# Patient Record
Sex: Female | Born: 1970 | Race: White | Hispanic: No | Marital: Married | Smoking: Never smoker
Health system: Southern US, Community
[De-identification: ages and names within clinical notes are randomized; demographics above are authoritative.]

## PROBLEM LIST (undated history)

## (undated) DIAGNOSIS — I509 Heart failure, unspecified: Secondary | ICD-10-CM

## (undated) DIAGNOSIS — F419 Anxiety disorder, unspecified: Secondary | ICD-10-CM

## (undated) DIAGNOSIS — C73 Malignant neoplasm of thyroid gland: Secondary | ICD-10-CM

## (undated) DIAGNOSIS — N6320 Unspecified lump in the left breast, unspecified quadrant: Secondary | ICD-10-CM

## (undated) DIAGNOSIS — J9383 Other pneumothorax: Secondary | ICD-10-CM

## (undated) DIAGNOSIS — K219 Gastro-esophageal reflux disease without esophagitis: Secondary | ICD-10-CM

## (undated) DIAGNOSIS — F329 Major depressive disorder, single episode, unspecified: Secondary | ICD-10-CM

## (undated) DIAGNOSIS — Z8042 Family history of malignant neoplasm of prostate: Secondary | ICD-10-CM

## (undated) DIAGNOSIS — R112 Nausea with vomiting, unspecified: Secondary | ICD-10-CM

## (undated) DIAGNOSIS — T7840XA Allergy, unspecified, initial encounter: Secondary | ICD-10-CM

## (undated) DIAGNOSIS — E079 Disorder of thyroid, unspecified: Secondary | ICD-10-CM

## (undated) DIAGNOSIS — J9311 Primary spontaneous pneumothorax: Secondary | ICD-10-CM

## (undated) DIAGNOSIS — R51 Headache: Secondary | ICD-10-CM

## (undated) DIAGNOSIS — Z9889 Other specified postprocedural states: Secondary | ICD-10-CM

## (undated) DIAGNOSIS — R519 Headache, unspecified: Secondary | ICD-10-CM

## (undated) DIAGNOSIS — C4490 Unspecified malignant neoplasm of skin, unspecified: Secondary | ICD-10-CM

## (undated) DIAGNOSIS — C50919 Malignant neoplasm of unspecified site of unspecified female breast: Secondary | ICD-10-CM

## (undated) DIAGNOSIS — G43909 Migraine, unspecified, not intractable, without status migrainosus: Secondary | ICD-10-CM

## (undated) DIAGNOSIS — Z8041 Family history of malignant neoplasm of ovary: Secondary | ICD-10-CM

## (undated) DIAGNOSIS — Z8619 Personal history of other infectious and parasitic diseases: Secondary | ICD-10-CM

## (undated) DIAGNOSIS — Z803 Family history of malignant neoplasm of breast: Secondary | ICD-10-CM

## (undated) DIAGNOSIS — C801 Malignant (primary) neoplasm, unspecified: Secondary | ICD-10-CM

## (undated) DIAGNOSIS — Z87440 Personal history of urinary (tract) infections: Secondary | ICD-10-CM

## (undated) DIAGNOSIS — Z800 Family history of malignant neoplasm of digestive organs: Secondary | ICD-10-CM

## (undated) DIAGNOSIS — I2699 Other pulmonary embolism without acute cor pulmonale: Secondary | ICD-10-CM

## (undated) HISTORY — DX: Family history of malignant neoplasm of breast: Z80.3

## (undated) HISTORY — DX: Headache: R51

## (undated) HISTORY — PX: MASTECTOMY: SHX3

## (undated) HISTORY — DX: Disorder of thyroid, unspecified: E07.9

## (undated) HISTORY — DX: Family history of malignant neoplasm of digestive organs: Z80.0

## (undated) HISTORY — DX: Allergy, unspecified, initial encounter: T78.40XA

## (undated) HISTORY — DX: Family history of malignant neoplasm of prostate: Z80.42

## (undated) HISTORY — DX: Anxiety disorder, unspecified: F41.9

## (undated) HISTORY — DX: Personal history of urinary (tract) infections: Z87.440

## (undated) HISTORY — PX: FOOT TENDON SURGERY: SHX958

## (undated) HISTORY — PX: CHOLECYSTECTOMY: SHX55

## (undated) HISTORY — DX: Malignant neoplasm of thyroid gland: C73

## (undated) HISTORY — DX: Malignant (primary) neoplasm, unspecified: C80.1

## (undated) HISTORY — DX: Family history of malignant neoplasm of ovary: Z80.41

## (undated) HISTORY — DX: Migraine, unspecified, not intractable, without status migrainosus: G43.909

## (undated) HISTORY — PX: ABDOMINAL HYSTERECTOMY: SHX81

## (undated) HISTORY — DX: Personal history of other infectious and parasitic diseases: Z86.19

## (undated) HISTORY — DX: Malignant neoplasm of unspecified site of unspecified female breast: C50.919

## (undated) HISTORY — PX: PACEMAKER INSERTION: SHX728

## (undated) HISTORY — DX: Headache, unspecified: R51.9

## (undated) HISTORY — DX: Unspecified malignant neoplasm of skin, unspecified: C44.90

---
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
## 1998-09-10 ENCOUNTER — Other Ambulatory Visit: Admission: RE | Admit: 1998-09-10 | Discharge: 1998-09-10 | Payer: Self-pay | Admitting: Obstetrics and Gynecology

## 1999-04-30 ENCOUNTER — Encounter: Payer: Self-pay | Admitting: Emergency Medicine

## 1999-04-30 ENCOUNTER — Emergency Department (HOSPITAL_COMMUNITY): Admission: EM | Admit: 1999-04-30 | Discharge: 1999-04-30 | Payer: Self-pay | Admitting: Emergency Medicine

## 2000-11-04 ENCOUNTER — Other Ambulatory Visit: Admission: RE | Admit: 2000-11-04 | Discharge: 2000-11-04 | Payer: Self-pay | Admitting: Obstetrics and Gynecology

## 2001-07-14 DIAGNOSIS — C4490 Unspecified malignant neoplasm of skin, unspecified: Secondary | ICD-10-CM

## 2001-07-14 HISTORY — DX: Unspecified malignant neoplasm of skin, unspecified: C44.90

## 2001-08-13 ENCOUNTER — Emergency Department (HOSPITAL_COMMUNITY): Admission: EM | Admit: 2001-08-13 | Discharge: 2001-08-13 | Payer: Self-pay | Admitting: Emergency Medicine

## 2001-09-20 ENCOUNTER — Encounter: Admission: RE | Admit: 2001-09-20 | Discharge: 2001-09-20 | Payer: Self-pay | Admitting: Internal Medicine

## 2001-09-20 ENCOUNTER — Encounter: Payer: Self-pay | Admitting: Internal Medicine

## 2001-09-23 ENCOUNTER — Encounter: Payer: Self-pay | Admitting: Internal Medicine

## 2001-09-23 ENCOUNTER — Encounter: Admission: RE | Admit: 2001-09-23 | Discharge: 2001-09-23 | Payer: Self-pay | Admitting: Internal Medicine

## 2001-10-08 ENCOUNTER — Ambulatory Visit (HOSPITAL_BASED_OUTPATIENT_CLINIC_OR_DEPARTMENT_OTHER): Admission: RE | Admit: 2001-10-08 | Discharge: 2001-10-08 | Payer: Self-pay | Admitting: *Deleted

## 2001-10-08 ENCOUNTER — Encounter (INDEPENDENT_AMBULATORY_CARE_PROVIDER_SITE_OTHER): Payer: Self-pay | Admitting: *Deleted

## 2002-06-15 ENCOUNTER — Other Ambulatory Visit: Admission: RE | Admit: 2002-06-15 | Discharge: 2002-06-15 | Payer: Self-pay | Admitting: Obstetrics and Gynecology

## 2002-06-17 ENCOUNTER — Ambulatory Visit (HOSPITAL_COMMUNITY): Admission: RE | Admit: 2002-06-17 | Discharge: 2002-06-17 | Payer: Self-pay | Admitting: Obstetrics and Gynecology

## 2002-06-17 ENCOUNTER — Encounter: Payer: Self-pay | Admitting: Obstetrics and Gynecology

## 2002-07-14 HISTORY — PX: THYROIDECTOMY: SHX17

## 2002-08-01 ENCOUNTER — Ambulatory Visit (HOSPITAL_COMMUNITY): Admission: RE | Admit: 2002-08-01 | Discharge: 2002-08-01 | Payer: Self-pay | Admitting: Surgery

## 2002-08-01 ENCOUNTER — Encounter: Payer: Self-pay | Admitting: Surgery

## 2002-08-01 ENCOUNTER — Encounter (INDEPENDENT_AMBULATORY_CARE_PROVIDER_SITE_OTHER): Payer: Self-pay | Admitting: *Deleted

## 2002-08-02 ENCOUNTER — Encounter (INDEPENDENT_AMBULATORY_CARE_PROVIDER_SITE_OTHER): Payer: Self-pay | Admitting: *Deleted

## 2002-09-26 ENCOUNTER — Encounter: Payer: Self-pay | Admitting: Surgery

## 2002-09-27 ENCOUNTER — Emergency Department (HOSPITAL_COMMUNITY): Admission: EM | Admit: 2002-09-27 | Discharge: 2002-09-27 | Payer: Self-pay | Admitting: Unknown Physician Specialty

## 2002-09-28 ENCOUNTER — Encounter: Payer: Self-pay | Admitting: Emergency Medicine

## 2002-09-29 ENCOUNTER — Ambulatory Visit (HOSPITAL_COMMUNITY): Admission: RE | Admit: 2002-09-29 | Discharge: 2002-09-30 | Payer: Self-pay | Admitting: Surgery

## 2002-09-29 ENCOUNTER — Encounter (INDEPENDENT_AMBULATORY_CARE_PROVIDER_SITE_OTHER): Payer: Self-pay

## 2002-09-30 ENCOUNTER — Encounter: Payer: Self-pay | Admitting: Urology

## 2003-01-25 ENCOUNTER — Inpatient Hospital Stay (HOSPITAL_COMMUNITY): Admission: AD | Admit: 2003-01-25 | Discharge: 2003-01-31 | Payer: Self-pay | Admitting: Obstetrics & Gynecology

## 2003-01-25 ENCOUNTER — Encounter: Payer: Self-pay | Admitting: Obstetrics & Gynecology

## 2003-01-27 ENCOUNTER — Encounter: Payer: Self-pay | Admitting: Obstetrics and Gynecology

## 2003-01-30 ENCOUNTER — Encounter: Payer: Self-pay | Admitting: Obstetrics and Gynecology

## 2003-05-03 ENCOUNTER — Encounter: Payer: Self-pay | Admitting: Obstetrics & Gynecology

## 2003-05-03 ENCOUNTER — Inpatient Hospital Stay (HOSPITAL_COMMUNITY): Admission: AD | Admit: 2003-05-03 | Discharge: 2003-05-03 | Payer: Self-pay | Admitting: Obstetrics & Gynecology

## 2003-07-07 ENCOUNTER — Inpatient Hospital Stay (HOSPITAL_COMMUNITY): Admission: AD | Admit: 2003-07-07 | Discharge: 2003-07-10 | Payer: Self-pay | Admitting: Obstetrics and Gynecology

## 2003-08-14 ENCOUNTER — Other Ambulatory Visit: Admission: RE | Admit: 2003-08-14 | Discharge: 2003-08-14 | Payer: Self-pay | Admitting: Obstetrics and Gynecology

## 2004-03-28 ENCOUNTER — Ambulatory Visit (HOSPITAL_BASED_OUTPATIENT_CLINIC_OR_DEPARTMENT_OTHER): Admission: RE | Admit: 2004-03-28 | Discharge: 2004-03-28 | Payer: Self-pay | Admitting: Plastic Surgery

## 2004-03-28 ENCOUNTER — Encounter (INDEPENDENT_AMBULATORY_CARE_PROVIDER_SITE_OTHER): Payer: Self-pay | Admitting: Plastic Surgery

## 2004-03-28 ENCOUNTER — Ambulatory Visit (HOSPITAL_COMMUNITY): Admission: RE | Admit: 2004-03-28 | Discharge: 2004-03-28 | Payer: Self-pay | Admitting: Plastic Surgery

## 2006-06-05 ENCOUNTER — Emergency Department: Payer: Self-pay | Admitting: Emergency Medicine

## 2007-10-14 ENCOUNTER — Encounter: Admission: RE | Admit: 2007-10-14 | Discharge: 2007-10-14 | Payer: Self-pay | Admitting: Internal Medicine

## 2010-07-14 LAB — HM PAP SMEAR: HM Pap smear: NORMAL

## 2010-08-04 ENCOUNTER — Encounter: Payer: Self-pay | Admitting: Obstetrics and Gynecology

## 2010-11-29 NOTE — Discharge Summary (Signed)
   NAME:  Stacy Chung, Stacy Chung                         ACCOUNT NO.:  [id number]   MEDICAL RECORD NO.:  [id number]                   PATIENT TYPE:  INP   LOCATION:  9371                                 FACILITY:  [facility]   PHYSICIAN:  Juluis Mire, M.D.                DATE OF BIRTH:  1971/02/20   DATE OF ADMISSION:  01/25/2003  DATE OF DISCHARGE:  01/31/2003                                 DISCHARGE SUMMARY   ADMISSION DIAGNOSES:  Intrauterine pregnancy at 14-4/7 weeks with possible  premature rupture of membranes.   DISCHARGE ACTIVITIES:  Intrauterine pregnancy at 15+ weeks with intact  membranes.   HISTORY AND PHYSICAL:  For complete history and physical please see written  note.   HOSPITAL COURSE:  The patient was seen on January 25, 2003 with possible  leakage of fluid. She was evaluated in triage where ferning was positive.  Ultrasound revealed borderline low amniotic fluid. The patient was admitted  for observation, begun on IV antibiotics. She was continued on bedrest on  the 16th and 17th. Followup ultrasounds had continued to reveal borderline  low amniotic fluid. She was maintained on antibiotics. Subsequently on the  19th, she complained of leakage again. A sterile speculum examination was  completely unremarkable. There was no pooling. The fluid was Nitrazine  negative and there was no ferning. Pressure on the fundus failed to reveal  any leakage. Subsequent ultrasound performed by the doctor on call revealed  what appeared to be normal amniotic fluid volume. It was speculated at that  point in time that potentially the patient was having leakage of urine.  Indeed, the urine did turn out to be Nitrazine positive. Obviously, we could  not exclude a possible high leak earlier, but this seems unlikely at this  early gestation. On the day of discharge, she was afebrile, had no further  leaking, had been up with bathroom privileges. We plan a followup ultrasound  prior to  discharge.   In terms of complications and stay in the hospital, the patient is currently  in stable condition.   DISPOSITION:  The patient will be at relative rest at home. Followup  tomorrow in the office. We will repeat ultrasound at that time. She will  report any increasing leaking, bleeding, or signs of infection.                                               Juluis Mire, M.D.    JSM/MEDQ  D:  01/31/2003  T:  01/31/2003  Job:  [id number]

## 2010-11-29 NOTE — Op Note (Signed)
NAME:  Stacy Chung, Stacy Chung                         ACCOUNT NO.:  [id number]   MEDICAL RECORD NO.:  [id number]                   PATIENT TYPE:  AMB   LOCATION:  DSC                                  FACILITY:  [facility]   PHYSICIAN:  Alfredia Ferguson, M.D.               DATE OF BIRTH:  1971-03-02   DATE OF PROCEDURE:  03/28/2004  DATE OF DISCHARGE:                                 OPERATIVE REPORT   NO DICTATION FOR THIS REPORT.                                               Alfredia Ferguson, M.D.    WBB/MEDQ  D:  03/28/2004  T:  03/28/2004  Job:  [id number]

## 2010-11-29 NOTE — Op Note (Signed)
NAME:  Stacy Chung, Stacy Chung                         ACCOUNT NO.:  [id number]   MEDICAL RECORD NO.:  [id number]                   PATIENT TYPE:  AMB   LOCATION:  DSC                                  FACILITY:  [facility]   PHYSICIAN:  Consuello Bossier., M.D.         DATE OF BIRTH:  Nov 26, 1970   DATE OF PROCEDURE:  03/28/2004  DATE OF DISCHARGE:                                 OPERATIVE REPORT   PREOPERATIVE DIAGNOSIS:  Dysplastic nevus, recurrent, right inferior breast  area, 1 cm in diameter.   POSTOPERATIVE DIAGNOSIS:  Dysplastic nevus, recurrent, right inferior breast  area, 1 cm in diameter.   PROCEDURE:  Wide excisional biopsy and closure of 2.5 cm in length complex  breast wound.   SURGEON:  Pleas Patricia, M.D.   ANESTHESIA:  Xylocaine 2% with 1:100,000 epinephrine.   FINDINGS:  The patient had two biopsies done showing a dysplastic nevus and  even the second one showed some margin involvement and she had been advised  to have further excisional biopsy.  There was no definite pigment in the  lesion and a reasonable amount of what appeared to be margin of lateral  tissue as well as deep tissue was removed as a biopsy and the wound closed  in layers.   DESCRIPTION OF PROCEDURE:  The patient was brought to the operating room and  marked off with a planned elliptical excision in a transverse direction.  She was prepped with Betadine and draped about the right breast area in a  sterile fashion.  She was anesthetized with Xylocaine 2% with 1:100,000  epinephrine.  Following the onset of anesthesia, the transverse full  thickness excisional biopsy was performed.  The wound was then closed with  interrupted subcutaneous 4-0 Monocryl followed by running subcuticular 4-0  Monocryl.  The skin wound was further reinforced by the application of Steri-  Strips.  A light compression dressing was applied.  The patient tolerated  the procedure well and was able to be discharged from the  operating table to  the recovery room and subsequently to be followed by me as an outpatient.                                               Consuello Bossier., M.D.    HH/MEDQ  D:  03/28/2004  T:  03/28/2004  Job:  [id number]

## 2010-11-29 NOTE — Op Note (Signed)
[facility]  Patient:    ASHYLA, LUTH Visit Number: [id number] MRN: [id number]          Service Type: DSU Location: [facility] Attending Physician:  Vikki Ports. Dictated by:   Vikki Ports, M.D. Proc. Date: 10/08/01 Admit Date:  10/08/2001 Discharge Date: 10/08/2001                             Operative Report  PREOPERATIVE DIAGNOSIS: Right breast mass.  POSTOPERATIVE DIAGNOSIS: Right breast mass.  OPERATION: Excisional right breast biopsy.  SURGEON: Catalina Lunger, M.D.  ANESTHESIA: Local MAC.  DESCRIPTION OF PROCEDURE: The patient was taken to the operating room and placed in the supine position.  After adequate MAC anesthesia was induced, the right breast was prepped and draped in normal sterile fashion. Using 1% lidocaine local anesthesia, the skin overlying the mass was anesthetized, and I dissected down onto the mass which was excised in its entirety. Adequate hemostasis was ensured, and the skin was closed with 4-0 subcuticular Monocryl. Steri-Strips and sterile dressing were applied.  The patient tolerated the procedure well and went to the PACU in good condition. Dictated by:   Vikki Ports, M.D. Attending Physician:  Danna Hefty R. DD:  10/08/01 TD:  10/10/01 Job: [id number]

## 2010-11-29 NOTE — Op Note (Signed)
NAME:  Stacy Chung, Stacy Chung                         ACCOUNT NO.:  [id number]   MEDICAL RECORD NO.:  [id number]                   Chung TYPE:  EMS   LOCATION:  MINO                                 FACILITY:  [facility]   PHYSICIAN:  Velora Heckler, M.D.                DATE OF BIRTH:  03/29/1971   DATE OF PROCEDURE:  09/29/2002  DATE OF DISCHARGE:  09/27/2002                                 OPERATIVE REPORT   PREOPERATIVE DIAGNOSIS:  Multinodular goiter with compressive symptoms.   POSTOPERATIVE DIAGNOSIS:  Multinodular goiter with compressive symptoms.   OPERATION PERFORMED:  Total thyroidectomy.   SURGEON:  Velora Heckler, M.D.   ASSISTANT:  Abigail Miyamoto, M.D.   ANESTHESIA:  General.   ANESTHESIOLOGIST:  Judie Petit, M.D.   ESTIMATED BLOOD LOSS:  Minimal.   PREPARATION:  Betadine.   COMPLICATIONS:  None.   INDICATIONS FOR PROCEDURE:  Stacy Chung is a 40 year old white female who  presents with multinodular goiter, mild compressive symptoms.  Stacy Chung  had undergone thyroid ultrasound with ultrasound guided fine needle biopsy  showing a hyperplastic nodule.  Stacy largest nodule measured 2.6 cm in  dimension.  Stacy Chung now comes to surgery for thyroidectomy.   DESCRIPTION OF PROCEDURE:  Stacy procedure was done in operating room #16  at  [facility].  Stacy Chung was brought to [facility]  operating room and placed in supine position on Stacy operating room table.  Following administration of general anesthesia, Stacy Chung was positioned  and then prepped and draped in Stacy usual strict aseptic fashion.  After  ascertaining that an adequate level of anesthesia had been obtained, a  Kocher incision was made with a #10 blade.  Dissection was carried down  through Stacy subcutaneous tissues and platysma.  Hemostasis was obtained with  Stacy electrocautery.  Skin flaps were developed cephalad and caudad from Stacy  thyroid notch to Stacy sternal notch.  A Mahorner  self-retaining retractor was  placed for exposure.  Stacy strap muscles were incised in Stacy midline and  dissection was begun on Stacy Chung's left side.  Strap muscles were  reflected laterally.  There was a relatively small left thyroid lobe.  It  contained multiple small nodules.  Venous tributaries were divided between  small Ligaclips.  Stacy gland was rolled anteriorly.  Stacy superior pole  vessels were carefully dissected out.  Stacy superior pole was ligated in  continuity with 2-0 silk ties, medium Ligaclips and divided.  Stacy superior  parathyroid gland was identified adjacent to Stacy superior pole vessels and  it was preserved.  Inferior venous tributaries were divided between small  Ligaclips.  Stacy gland was rolled up and onto Stacy trachea.  Recurrent  laryngeal nerve was identified and preserved.  Branches of Stacy inferior  thyroid artery were divided between small Ligaclips.  Ligament of Allyson Sabal was  transected and Stacy gland  was rolled onto Stacy anterior trachea.  A small  pyramidal lobe was included with Stacy isthmus.  A pack was placed in Stacy left  neck.  Next, we turned our attention to Stacy right side.  Again, strap  muscles were reflected laterally.  Stacy middle thyroid vein was divided  between small Ligaclips.  Adventitial tissues were bluntly dissected off  with a Kitner.  Inferior venous tributaries were divided between small  Ligaclips.  Superior pole vessels were dissected out.  They were ligated in  continuity with 2-0 silk ties and medium Ligaclips and divided.  Stacy gland  was rolled anteriorly.  A moderate sized tubercle Zuckerkandl was  identified.  This was carefully dissected out.  Recurrent laryngeal nerve  was identified immediately posterior to Stacy tubercle.  Branches of Stacy  inferior thyroid artery were divided between small Ligaclips.  Stacy gland was  rolled further anteriorly until Stacy ligament of Allyson Sabal could be safely  transected. Stacy gland was delivered up and onto Stacy  anterior surface of Stacy  trachea and this completely excised using Stacy electrocautery for hemostasis.  Sutures used to mark Stacy right superior pole.  It was submitted to pathology  for review.  Hemostasis was obtained in both sides of Stacy neck.  Stacy neck  was irrigated with warm saline which was evacuated.  Surgicel was placed  over Stacy area of Stacy recurrent laryngeal nerves bilaterally.  Strap muscles  were reapproximated in Stacy midline with interrupted 3-0 Vicryl sutures.  Stacy  platysma was closed with interrupted 3-0 Vicryl sutures.  Skin edges were  reapproximated with widely spaced stainless steel staples and interspaced  half inch Steri-Strips and benzoin.  Sterile gauze dressings were applied.  Stacy Chung was awakened from anesthesia and brought to Stacy recovery room in  stable condition.  Stacy Chung tolerated Stacy procedure well.                                                Velora Heckler, M.D.    TMG/MEDQ  D:  09/29/2002  T:  09/29/2002  Job:  [id number]   cc:   Juluis Mire, M.D.  [address] [place]  [place] [address]  Fax: [phone]   Darius Bump, M.D.  [phone] [address]  [place] [address]  Fax: [phone]

## 2011-01-01 ENCOUNTER — Other Ambulatory Visit: Payer: Self-pay | Admitting: Dermatology

## 2011-06-20 ENCOUNTER — Encounter: Payer: Self-pay | Admitting: Endocrinology

## 2011-06-20 ENCOUNTER — Ambulatory Visit (INDEPENDENT_AMBULATORY_CARE_PROVIDER_SITE_OTHER): Payer: BC Managed Care – PPO | Admitting: Endocrinology

## 2011-06-20 ENCOUNTER — Other Ambulatory Visit (INDEPENDENT_AMBULATORY_CARE_PROVIDER_SITE_OTHER): Payer: BC Managed Care – PPO

## 2011-06-20 DIAGNOSIS — G43909 Migraine, unspecified, not intractable, without status migrainosus: Secondary | ICD-10-CM | POA: Insufficient documentation

## 2011-06-20 DIAGNOSIS — R739 Hyperglycemia, unspecified: Secondary | ICD-10-CM

## 2011-06-20 DIAGNOSIS — R7309 Other abnormal glucose: Secondary | ICD-10-CM

## 2011-06-20 DIAGNOSIS — L680 Hirsutism: Secondary | ICD-10-CM

## 2011-06-20 DIAGNOSIS — C8190 Hodgkin lymphoma, unspecified, unspecified site: Secondary | ICD-10-CM | POA: Insufficient documentation

## 2011-06-20 DIAGNOSIS — C73 Malignant neoplasm of thyroid gland: Secondary | ICD-10-CM | POA: Insufficient documentation

## 2011-06-20 DIAGNOSIS — C439 Malignant melanoma of skin, unspecified: Secondary | ICD-10-CM

## 2011-06-20 LAB — HEMOGLOBIN A1C: Hgb A1c MFr Bld: 5.6 % (ref 4.6–6.5)

## 2011-06-20 NOTE — Progress Notes (Signed)
Subjective:    Patient ID: Stacy Chung, female    DOB: [birth year], 40 y.o.   MRN: [id number]  HPI In 2004, pt had thyroidectomy, for a nodule that was suspicious on bx.  pathol showed multinodular goiter, with 2 mm focus of papillary adenocarcinoma.  She has been on synthroid since.  She does not notice any nodule at the neck.   Pt states of moderate headache, worst at the bifrontal areas, which started in the context of a genital-area cutaneous bacterial coli infection.  She has assoc mood swings.  However, it persisted after successful rx of the infection.  It has now resolved.   She has a "mirena" contraceptive device.  She has noted cbg as high as 300.   Past Medical History  Diagnosis Date  . Increased frequency of headaches   . Migraines   . Thyroid disease   . Anxiety   . Hx: UTI (urinary tract infection)   . Allergy   . Carcinoma of thyroid   . History of chicken pox   . Hodgkin's disease     Past Surgical History  Procedure Date  . 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 Thyroidectomy 2004    nodules, carcinoma  . Laparoscopic splenectomy 1987    Hodgkins Disease    History   Social History  . Marital Status: Married    Spouse Name: N/A    Number of Children: 2  . Years of Education: 16   Occupational History  . Manufacturing systems engineer    Social History Main Topics  . Smoking status: Never Smoker   . Smokeless tobacco: Not on file  . Alcohol Use: Not on file  . Drug Use: No  . Sexually Active: Not on file   Other Topics Concern  . Not on file   Social History Narrative   Regular exercise-no    No current outpatient prescriptions on file prior to visit.    Allergies  Allergen Reactions  . Macrodantin   . Pertussis Vaccines     Family History  Problem Relation Age of Onset  . Alcohol abuse Maternal Aunt   . Cancer Maternal Uncle     Prostate Cancer  . Cancer Maternal Grandmother    Colon Cancer  . Cancer Maternal Grandfather     Prostate Cancer  . Cancer Other     FH of Breast Cancer, Ovarian/Uterine Cancer  DM: none  BP 94/68  Pulse 92  Temp(Src) 98.4 F (36.9 C) (Oral)  Ht 5\' 4"  (1.626 m)  Wt 175 lb (79.379 kg)  BMI 30.04 kg/m2  SpO2 97%   Review of Systems  Constitutional:       She has weight gain  HENT: Negative for hearing loss.   Eyes: Negative for visual disturbance.  Respiratory: Negative for shortness of breath.   Cardiovascular: Negative for chest pain.  Gastrointestinal: Negative for anal bleeding.  Genitourinary: Negative for frequency and hematuria.  Musculoskeletal: Negative for myalgias.  Skin: Negative for rash.  Neurological: Negative for numbness.  Psychiatric/Behavioral: Negative for dysphoric mood.   She has developed mild hirsutism on the face.  She has tried aldactone and vaniqa.  She reports a  varying libido    Objective:   Physical Exam VS: see vs page GEN: no distress HEAD: head: no deformity eyes: no periorbital swelling, no proptosis external nose and ears are normal mouth: no lesion seen Neck: a healed scar is  present.  i do not appreciate a nodule in the thyroid or elsewhere in the neck. ABD: abdomen is soft, nontender.  no hepatosplenomegaly.  not distended.  no hernia.  MUSCULOSKELETAL: muscle bulk and strength are grossly normal.  no obvious joint swelling.  gait is normal and steady EXTEMITIES: no deformity.  no ulcer on the feet.  feet are of normal color and temp.  no edema PULSES: dorsalis pedis intact bilat.  no carotid bruit NEURO:  cn 2-12 grossly intact.   readily moves all 4's.  sensation is intact to touch on the feet SKIN:  Normal texture and temperature.  No rash or suspicious lesion is visible.  Mild terminal hair on the face.   NODES:  None palpable at the neck. PSYCH: alert, oriented x3.  Does not appear anxious nor depressed.      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  No f/u needed except for annual physical exam of the neck Reported episodes of severe hyperglycemia.  These indicate risk of developing DM Headache, uncertain etiology Hirsutism, mild. Postsurgical hypothyroidism, well-replaced

## 2011-06-20 NOTE — Patient Instructions (Addendum)
blood tests are being requested for you today.  please call [phone] to hear your test results.  You will be prompted to enter the 9-digit "MRN" number that appears at the top left of this page, followed by #.  Then you will hear the message. good diet and exercise habits significanly improve the control of your blood sugar.  please let me know if you wish to be referred to a dietician.  You should consider a longer trial of spironolactone or vaniqa.  please call if you want a prescription for the vaniqa.  Please continue the same thyroid medication.   (update: i left message on phone-tree:  rx as we discussed)

## 2011-07-02 ENCOUNTER — Telehealth: Payer: Self-pay | Admitting: Endocrinology

## 2011-07-02 NOTE — Telephone Encounter (Signed)
Received copies from [facility],on 12.19.12 . Forwarded  37 pages to Dr. Everardo All ,for review.  sj

## 2013-09-06 ENCOUNTER — Ambulatory Visit (INDEPENDENT_AMBULATORY_CARE_PROVIDER_SITE_OTHER): Payer: BC Managed Care – PPO | Admitting: Sports Medicine

## 2013-09-06 VITALS — BP 108/64 | HR 87 | Temp 98.1°F | Ht 65.0 in | Wt 173.0 lb

## 2013-09-06 DIAGNOSIS — M7989 Other specified soft tissue disorders: Secondary | ICD-10-CM

## 2013-09-06 DIAGNOSIS — M25579 Pain in unspecified ankle and joints of unspecified foot: Secondary | ICD-10-CM

## 2013-09-06 MED ORDER — HYDROCODONE-ACETAMINOPHEN 5-325 MG PO TABS: 1.0000 | ORAL_TABLET | Freq: Three times a day (TID) | ORAL | Status: DC | PRN

## 2013-09-06 NOTE — Progress Notes (Signed)
   Subjective:    I'm seeing this patient as a consultation for:  Dr. Osborne Casco  CC: Toe pain  HPI: Patient is a pleasant 43 yo female with a h/o thyroid and hodgkin's disease who presents with R toe pain. The pain started with sudden onset in the right 1st metatarsophalangeal joint on Sunday morning and has progressively gotten worse. The pain is relieved slightly with Advil. Painful to touch and when walking. Pain radiates to the distal phalanx. Patient denies any trauma to the area or any changes in diet. Denies any previous history of gout. Symptoms are severe, persistent.  Past medical history, Surgical history, Family history not pertinant except as noted below, Social history, Allergies, and medications have been entered into the medical record, reviewed, and no changes needed.   Review of Systems: No headache, visual changes, nausea, vomiting, diarrhea, constipation, dizziness, abdominal pain, skin rash, fevers, chills, night sweats, weight loss, swollen lymph nodes, body aches, joint swelling, muscle aches, chest pain, shortness of breath, mood changes, visual or auditory hallucinations.   Objective:   General: Well Developed, well nourished, and in no acute distress.  Neuro/Psych: Alert and oriented x3, extra-ocular muscles intact, able to move all 4 extremities, sensation grossly intact. Skin: Warm and dry, no rashes noted.  Respiratory: Not using accessory muscles, speaking in full sentences, trachea midline.  Cardiovascular: Pulses palpable, no extremity edema. Abdomen: Does not appear distended. Right first metatarsophalangeal joint: Erythematous and swollen, tender to palpation over R 1st metatarsophalangeal joint with a palpable fluid wave.  Pain with dorsiflexion and plantarflexion of the R big toe.   Procedure: Real-time Ultrasound Guided aspiration/Injection of right first metatarsophalangeal joint Device: GE Logiq E  Verbal informed consent obtained.  Time-out conducted.    Noted no overlying erythema, induration, or other signs of local infection.  Skin prepped in a sterile fashion.  Local anesthesia: Topical Ethyl chloride.  With sterile technique and under real time ultrasound guidance:  Noted copious effusion and synovitis of the first metatarsophalangeal joint, 0.5-gauge needle was advanced into the joint, approximately 0.5 cc of cloudy fluid was aspirated, syringe and switched and 0.5 cc Kenalog 40, 0.5 cc lidocaine easily injected. Completed without difficulty  Pain immediately resolved suggesting accurate placement of the medication.  Advised to call if fevers/chills, erythema, induration, drainage, or persistent bleeding.  Images permanently stored and available for review in the ultrasound unit.  Impression: Technically successful ultrasound guided injection.  Impression and Recommendations:   This case required medical decision making of moderate complexity.

## 2013-09-06 NOTE — Assessment & Plan Note (Addendum)
This clinically resembles podagra. Aspiration and injection of toe. Hydrocodone as needed for pain. Cell count, crystal analysis.  Return to see me in one month.

## 2013-09-06 NOTE — Addendum Note (Signed)
Addended by: [name] on: 09/06/2013 05:29 PM   Modules accepted: Level of Service

## 2013-09-07 ENCOUNTER — Encounter: Payer: Self-pay | Admitting: Sports Medicine

## 2013-09-07 LAB — BASIC METABOLIC PANEL
BUN: 12 mg/dL (ref 6–23)
CO2: 22 mEq/L (ref 19–32)
Chloride: 103 mEq/L (ref 96–112)
Creat: 0.77 mg/dL (ref 0.50–1.10)
Glucose, Bld: 75 mg/dL (ref 70–99)

## 2013-09-07 LAB — SYNOVIAL CELL COUNT + DIFF, W/ CRYSTALS
Crystals, Fluid: NONE SEEN
Eosinophils-Synovial: 0 % (ref 0–1)
Lymphocytes-Synovial Fld: 14 % (ref 0–20)
Monocyte/Macrophage: 33 % — ABNORMAL LOW (ref 50–90)
Neutrophil, Synovial: 53 % — ABNORMAL HIGH (ref 0–25)
WBC, Synovial: 1620 cu mm — ABNORMAL HIGH (ref 0–200)

## 2013-09-07 LAB — BASIC METABOLIC PANEL WITH GFR
Calcium: 9.3 mg/dL (ref 8.4–10.5)
Potassium: 4.1 meq/L (ref 3.5–5.3)
Sodium: 134 meq/L — ABNORMAL LOW (ref 135–145)

## 2013-09-07 LAB — URIC ACID: Uric Acid, Serum: 4.6 mg/dL (ref 2.4–7.0)

## 2013-09-10 LAB — BODY FLUID CULTURE
Gram Stain: NONE SEEN
Organism ID, Bacteria: NO GROWTH

## 2013-11-24 ENCOUNTER — Encounter: Payer: Self-pay | Admitting: Sports Medicine

## 2013-11-24 ENCOUNTER — Ambulatory Visit (INDEPENDENT_AMBULATORY_CARE_PROVIDER_SITE_OTHER): Payer: BC Managed Care – PPO

## 2013-11-24 ENCOUNTER — Ambulatory Visit (INDEPENDENT_AMBULATORY_CARE_PROVIDER_SITE_OTHER): Payer: BC Managed Care – PPO | Admitting: Sports Medicine

## 2013-11-24 VITALS — BP 109/71 | HR 82 | Ht 65.0 in | Wt 167.0 lb

## 2013-11-24 DIAGNOSIS — M25579 Pain in unspecified ankle and joints of unspecified foot: Secondary | ICD-10-CM

## 2013-11-24 DIAGNOSIS — M7750 Other enthesopathy of unspecified foot: Secondary | ICD-10-CM

## 2013-11-24 DIAGNOSIS — M7672 Peroneal tendinitis, left leg: Secondary | ICD-10-CM

## 2013-11-24 DIAGNOSIS — M7670 Peroneal tendinitis, unspecified leg: Secondary | ICD-10-CM | POA: Insufficient documentation

## 2013-11-24 MED ORDER — MELOXICAM 15 MG PO TABS: ORAL_TABLET | ORAL | Status: DC

## 2013-11-24 NOTE — Assessment & Plan Note (Signed)
Patient does have a vacation coming up to [place]. We are going to treat this very aggressively. Injection into the peroneal tendon sheath. Lateral heel wedge, Mobic, home rehabilitation. Return to see me in a month.

## 2013-11-24 NOTE — Progress Notes (Signed)
  Subjective:    CC: Left ankle pain  HPI: Stacy Chung returns, her toe has completely resolved, for the past month she's had pain she localizes behind the lateral malleolus, worse with eversion, moderate, persistent without radiation.  Past medical history, Surgical history, Family history not pertinant except as noted below, Social history, Allergies, and medications have been entered into the medical record, reviewed, and no changes needed.   Review of Systems: No fevers, chills, night sweats, weight loss, chest pain, or shortness of breath.   Objective:    General: Well Developed, well nourished, and in no acute distress.  Neuro: Alert and oriented x3, extra-ocular muscles intact, sensation grossly intact.  HEENT: Normocephalic, atraumatic, pupils equal round reactive to light, neck supple, no masses, no lymphadenopathy, thyroid nonpalpable.  Skin: Warm and dry, no rashes. Cardiac: Regular rate and rhythm, no murmurs rubs or gallops, no lower extremity edema.  Respiratory: Clear to auscultation bilaterally. Not using accessory muscles, speaking in full sentences. Left Ankle: Lateral ankle appear swollen. Range of motion is full in all directions. Strength is 5/5 in all directions. Stable lateral and medial ligaments; squeeze test and kleiger test unremarkable; Talar dome nontender; No pain at base of 5th MT; No tenderness over cuboid; No tenderness over N spot or navicular prominence No tenderness on posterior aspects of lateral and medial malleolus Tender palpation over the peroneals, reproduction of pain with resisted eversion. Negative tarsal tunnel tinel's Able to walk 4 steps.  Procedure: Real-time Ultrasound Guided Injection of left peroneal tendon sheath Device: GE Logiq E  Verbal informed consent obtained.  Time-out conducted.  Noted no overlying erythema, induration, or other signs of local infection.  Skin prepped in a sterile fashion.  Local anesthesia: Topical Ethyl  chloride.  With sterile technique and under real time ultrasound guidance:  Noted copious fluid in the peroneal tendon sheath just distal to the malleolus, 1 cc Kenalog 40, 4 cc lidocaine injected easily. Completed without difficulty  Pain immediately resolved suggesting accurate placement of the medication.  Advised to call if fevers/chills, erythema, induration, drainage, or persistent bleeding.  Images permanently stored and available for review in the ultrasound unit.  Impression: Technically successful ultrasound guided injection.  Impression and Recommendations:

## 2013-12-26 ENCOUNTER — Ambulatory Visit: Payer: BC Managed Care – PPO | Admitting: Sports Medicine

## 2014-01-03 ENCOUNTER — Encounter: Payer: Self-pay | Admitting: Sports Medicine

## 2014-01-03 ENCOUNTER — Ambulatory Visit (INDEPENDENT_AMBULATORY_CARE_PROVIDER_SITE_OTHER): Payer: BC Managed Care – PPO

## 2014-01-03 ENCOUNTER — Ambulatory Visit (INDEPENDENT_AMBULATORY_CARE_PROVIDER_SITE_OTHER): Payer: BC Managed Care – PPO | Admitting: Sports Medicine

## 2014-01-03 VITALS — BP 126/80 | HR 97 | Ht 65.0 in | Wt 163.5 lb

## 2014-01-03 DIAGNOSIS — M7672 Peroneal tendinitis, left leg: Secondary | ICD-10-CM

## 2014-01-03 DIAGNOSIS — M6520 Calcific tendinitis, unspecified site: Secondary | ICD-10-CM

## 2014-01-03 DIAGNOSIS — M7750 Other enthesopathy of unspecified foot: Secondary | ICD-10-CM

## 2014-01-03 MED ORDER — TRAMADOL HCL 50 MG PO TABS: ORAL_TABLET | ORAL | Status: DC

## 2014-01-03 NOTE — Assessment & Plan Note (Signed)
Stacy Chung did extremely well after her peroneal tendon sheath injection approximately 5 weeks ago on the left side. She now has some pain over the fifth metatarsal base. X-rays, continue ASO, and tramadol for breakthrough pain. Continue home rehabilitation exercises and return to see me in 3 weeks.

## 2014-01-03 NOTE — Progress Notes (Signed)
  Subjective:    CC: Followup  HPI: 5 weeks ago injected Stacy Chung's left peroneal tendon sheath and she had a fantastic response with complete relief of her pain. Unfortunately she had an inversion injury with a recurrence of pain she localizes over the base of the fifth metatarsal. There is minimal swelling, she is ambulatory and she has been wearing a lace up ASO brace.  Past medical history, Surgical history, Family history not pertinant except as noted below, Social history, Allergies, and medications have been entered into the medical record, reviewed, and no changes needed.   Review of Systems: No fevers, chills, night sweats, weight loss, chest pain, or shortness of breath.   Objective:    General: Well Developed, well nourished, and in no acute distress.  Neuro: Alert and oriented x3, extra-ocular muscles intact, sensation grossly intact.  HEENT: Normocephalic, atraumatic, pupils equal round reactive to light, neck supple, no masses, no lymphadenopathy, thyroid nonpalpable.  Skin: Warm and dry, no rashes. Cardiac: Regular rate and rhythm, no murmurs rubs or gallops, no lower extremity edema.  Respiratory: Clear to auscultation bilaterally. Not using accessory muscles, speaking in full sentences. Left Ankle: No visible erythema or swelling. Range of motion is full in all directions. Strength is 5/5 in all directions. Stable lateral and medial ligaments; squeeze test and kleiger test unremarkable; Talar dome nontender; No pain at base of 5th MT; No tenderness over cuboid; No tenderness over N spot or navicular prominence No tenderness on posterior aspects of lateral and medial malleolus No sign of peroneal tendon subluxations or tenderness to palpation Negative tarsal tunnel tinel's Tendon palpation over the base of the fifth metatarsal, no tenderness over the peroneals. She does have some reproduction of pain with resisted eversion.   On previous x-rays it does appear as though  she has an accessory cuboid bone. The cuboid does appear very fragmented near the accessory bone. On repeat films today it appears that some of these components may have been avulsed  Impression and Recommendations:

## 2014-01-04 ENCOUNTER — Ambulatory Visit: Payer: BC Managed Care – PPO | Admitting: Sports Medicine

## 2014-01-24 ENCOUNTER — Ambulatory Visit: Payer: BC Managed Care – PPO | Admitting: Sports Medicine

## 2014-02-01 ENCOUNTER — Encounter: Payer: Self-pay | Admitting: Sports Medicine

## 2014-02-01 ENCOUNTER — Ambulatory Visit (INDEPENDENT_AMBULATORY_CARE_PROVIDER_SITE_OTHER): Payer: BC Managed Care – PPO | Admitting: Sports Medicine

## 2014-02-01 VITALS — BP 109/71 | HR 83 | Ht 65.0 in | Wt 159.0 lb

## 2014-02-01 DIAGNOSIS — M25572 Pain in left ankle and joints of left foot: Secondary | ICD-10-CM

## 2014-02-01 DIAGNOSIS — M25579 Pain in unspecified ankle and joints of unspecified foot: Secondary | ICD-10-CM

## 2014-02-01 NOTE — Progress Notes (Signed)
  Subjective:    CC: Followup  HPI: Left ankle pain: Stacy Chung is a very pleasant 43 year old female, she is one of our drug reps, for the past 5 months I have been treating her left ankle and foot pain, she did respond initially very well to conservative treatment, eventually she developed a clinical perineal tendinitis which was injected, and caused resolution of symptoms. Unfortunately she continues to have mild pain, this is localized predominately at the base of the fifth metatarsal, with some pain at the lateral talocrural joint. She has already been through physician directed physical therapy, NSAIDs, bracing, and injections. Pain is very, very mild, and much improved from before, but still present.  Past medical history, Surgical history, Family history not pertinant except as noted below, Social history, Allergies, and medications have been entered into the medical record, reviewed, and no changes needed.   Review of Systems: No fevers, chills, night sweats, weight loss, chest pain, or shortness of breath.   Objective:    General: Well Developed, well nourished, and in no acute distress.  Neuro: Alert and oriented x3, extra-ocular muscles intact, sensation grossly intact.  HEENT: Normocephalic, atraumatic, pupils equal round reactive to light, neck supple, no masses, no lymphadenopathy, thyroid nonpalpable.  Skin: Warm and dry, no rashes. Cardiac: Regular rate and rhythm, no murmurs rubs or gallops, no lower extremity edema.  Respiratory: Clear to auscultation bilaterally. Not using accessory muscles, speaking in full sentences. Left Ankle: No visible erythema or swelling. Range of motion is full in all directions. Strength is 5/5 in all directions. Stable lateral and medial ligaments; squeeze test and kleiger test unremarkable; Talar dome nontender; only minimal tenderness to palpation of the lateral talocrural joint No pain at base of 5th MT; No tenderness over cuboid; No tenderness  over N spot or navicular prominence No tenderness on posterior aspects of lateral and medial malleolus No sign of peroneal tendon subluxations; Negative tarsal tunnel tinel's Able to walk 4 steps. Left Foot: No visible erythema or swelling. Range of motion is full in all directions. Strength is 5/5 in all directions. No hallux valgus. No pes cavus or pes planus. No abnormal callus noted. No pain over the navicular prominence, or base of fifth metatarsal. No tenderness to palpation of the calcaneal insertion of plantar fascia. No pain at the Achilles insertion. No pain over the calcaneal bursa. No pain of the retrocalcaneal bursa. Mild tenderness to palpation with only mild swelling over the dorsal shaft of the fifth metatarsal.geal joints. No pain with compression of the metatarsal heads. Neurovascularly intact distally.  Impression and Recommendations:

## 2014-02-01 NOTE — Assessment & Plan Note (Signed)
Persistent pain over the dorsum of the fifth metatarsal shaft as well as the anterior talocrural joint despite 5 months of conservative measures including physician directed physical therapy, NSAIDs, and injections. She does need MRI of both the left ankle and foot. Return to see me go over results of the MRI. I do suspect stress injury of the fifth metatarsal shaft and talocrural degenerative changes. Continue ASO. She can call back for tramadol refill if needed.

## 2014-02-03 ENCOUNTER — Encounter: Payer: Self-pay | Admitting: Sports Medicine

## 2014-02-13 ENCOUNTER — Encounter: Payer: Self-pay | Admitting: Sports Medicine

## 2014-02-13 ENCOUNTER — Ambulatory Visit: Payer: BC Managed Care – PPO | Admitting: Sports Medicine

## 2014-03-01 ENCOUNTER — Ambulatory Visit: Payer: BC Managed Care – PPO | Admitting: Sports Medicine

## 2014-03-06 ENCOUNTER — Ambulatory Visit (INDEPENDENT_AMBULATORY_CARE_PROVIDER_SITE_OTHER): Payer: BC Managed Care – PPO | Admitting: Sports Medicine

## 2014-03-06 VITALS — BP 103/65 | HR 89 | Ht 65.0 in | Wt 161.0 lb

## 2014-03-06 DIAGNOSIS — M25579 Pain in unspecified ankle and joints of unspecified foot: Secondary | ICD-10-CM

## 2014-03-06 DIAGNOSIS — M659 Synovitis and tenosynovitis, unspecified: Secondary | ICD-10-CM

## 2014-03-06 DIAGNOSIS — M7672 Peroneal tendinitis, left leg: Secondary | ICD-10-CM

## 2014-03-06 DIAGNOSIS — M65979 Unspecified synovitis and tenosynovitis, unspecified ankle and foot: Secondary | ICD-10-CM

## 2014-03-06 DIAGNOSIS — M25572 Pain in left ankle and joints of left foot: Secondary | ICD-10-CM

## 2014-03-06 NOTE — Assessment & Plan Note (Signed)
Unfortunately there is a complete tear of the peroneus longus with retraction and a partial tear peroneus brevis. Continue Mobic, at this point she has failed therapy, bracing, NSAIDs, injection, I do think she has become a surgical candidate, her ankle is weak, she does not have confidence in it, and it feels unstable.

## 2014-03-06 NOTE — Progress Notes (Signed)
  Subjective:    CC: Followup  HPI: Stacy Chung returns, she had an ankle injury some time ago, she has been through physical therapy, NSAIDs, bracing, this was insufficient so we proceeded with an ultrasound guided injection into the peroneal tendon sheath, she continued to have pain so we were recently obtained an MRI the results of which will be dictated below. She is doing significantly better however she does have significant pain with eversion of the ankle, and she feels as though the ankle is unstable and she does not have confidence in it. Symptoms are mild, persistent.  Past medical history, Surgical history, Family history not pertinant except as noted below, Social history, Allergies, and medications have been entered into the medical record, reviewed, and no changes needed.   Review of Systems: No fevers, chills, night sweats, weight loss, chest pain, or shortness of breath.   Objective:    General: Well Developed, well nourished, and in no acute distress.  Neuro: Alert and oriented x3, extra-ocular muscles intact, sensation grossly intact.  HEENT: Normocephalic, atraumatic, pupils equal round reactive to light, neck supple, no masses, no lymphadenopathy, thyroid nonpalpable.  Skin: Warm and dry, no rashes. Cardiac: Regular rate and rhythm, no murmurs rubs or gallops, no lower extremity edema.  Respiratory: Clear to auscultation bilaterally. Not using accessory muscles, speaking in full sentences. Left Ankle: Minimally swollen behind the lateral malleolus Range of motion is full in all directions. Strength is 5/5 in all directions. Stable lateral and medial ligaments; squeeze test and kleiger test unremarkable; Talar dome nontender; No pain at base of 5th MT; No tenderness over cuboid; No tenderness over N spot or navicular prominence To palpation over the peroneal, there is reproduction of pain with resisted eversion. Negative tarsal tunnel tinel's Able to walk 4 steps.  MRI of  the ankle and foot were reviewed, the ankle shows a complete tear of the peroneus longus with retraction to the level of just distal to the malleolus, there is also a partial split tear of the peroneus brevis with significant fluid and hematoma in the tendon sheath.  Impression and Recommendations:

## 2014-08-29 ENCOUNTER — Other Ambulatory Visit: Payer: Self-pay | Admitting: Dermatology

## 2014-09-15 ENCOUNTER — Ambulatory Visit (INDEPENDENT_AMBULATORY_CARE_PROVIDER_SITE_OTHER): Payer: BLUE CROSS/BLUE SHIELD | Admitting: Sports Medicine

## 2014-09-15 ENCOUNTER — Ambulatory Visit (INDEPENDENT_AMBULATORY_CARE_PROVIDER_SITE_OTHER): Payer: BLUE CROSS/BLUE SHIELD

## 2014-09-15 ENCOUNTER — Encounter: Payer: Self-pay | Admitting: Sports Medicine

## 2014-09-15 VITALS — BP 117/71 | HR 96 | Ht 65.0 in | Wt 164.0 lb

## 2014-09-15 DIAGNOSIS — M20012 Mallet finger of left finger(s): Secondary | ICD-10-CM | POA: Diagnosis not present

## 2014-09-15 DIAGNOSIS — S6992XA Unspecified injury of left wrist, hand and finger(s), initial encounter: Secondary | ICD-10-CM

## 2014-09-15 DIAGNOSIS — S62637A Displaced fracture of distal phalanx of left little finger, initial encounter for closed fracture: Secondary | ICD-10-CM

## 2014-09-15 DIAGNOSIS — X58XXXA Exposure to other specified factors, initial encounter: Secondary | ICD-10-CM

## 2014-09-15 MED ORDER — HYDROCODONE-ACETAMINOPHEN 5-325 MG PO TABS: 1.0000 | ORAL_TABLET | Freq: Three times a day (TID) | ORAL | Status: DC | PRN

## 2014-09-15 NOTE — Assessment & Plan Note (Addendum)
Swollen, inability to fully extend the finger, this is suggestive of a distal interphalangeal joint fracture quite possibly an early mallet finger. I did place a stack splint, vicodin for pain, x-rays, return in 3 weeks.  I billed a fracture code for this encounter, all subsequent visits will be post-op checks in the global period.

## 2014-09-15 NOTE — Progress Notes (Signed)
   Subjective:    I'm seeing this patient as a consultation for: Dr. 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 She had immediate pain, swelling, discoloration, and inability to fully extend the pinky finger. Pain is severe, persistent.  Past medical history, Surgical history, Family history not pertinant except as noted below, Social history, Allergies, and medications have been entered into the medical record, reviewed, and no changes needed.   Review of Systems: No headache, visual changes, nausea, vomiting, diarrhea, constipation, dizziness, abdominal pain, skin rash, fevers, chills, night sweats, weight loss, swollen lymph nodes, body aches, joint swelling, muscle aches, chest pain, shortness of breath, mood changes, visual or auditory hallucinations.   Objective:   General: Well Developed, well nourished, and in no acute distress.  Neuro/Psych: Alert and oriented x3, extra-ocular muscles intact, able to move all 4 extremities, sensation grossly intact. Skin: Warm and dry, no rashes noted.  Respiratory: Not using accessory muscles, speaking in full sentences, trachea midline.  Cardiovascular: Pulses palpable, no extremity edema. Abdomen: Does not appear distended. Left hand: Visible swelling at the distal interphalangeal joint of the fifth digit, there is approximately 5 of extension lag, but moderate strength to resisted extension at the distal interphalangeal joint.  X-rays reviewed and show a small avulsion fracture from the dorsal base of the distal phalanx.  Impression and Recommendations:   This case required medical decision making of moderate complexity.

## 2014-10-03 ENCOUNTER — Ambulatory Visit (INDEPENDENT_AMBULATORY_CARE_PROVIDER_SITE_OTHER): Payer: BLUE CROSS/BLUE SHIELD

## 2014-10-03 ENCOUNTER — Ambulatory Visit (INDEPENDENT_AMBULATORY_CARE_PROVIDER_SITE_OTHER): Payer: BLUE CROSS/BLUE SHIELD | Admitting: Sports Medicine

## 2014-10-03 ENCOUNTER — Encounter: Payer: Self-pay | Admitting: Sports Medicine

## 2014-10-03 DIAGNOSIS — S62667D Nondisplaced fracture of distal phalanx of left little finger, subsequent encounter for fracture with routine healing: Secondary | ICD-10-CM

## 2014-10-03 DIAGNOSIS — X58XXXD Exposure to other specified factors, subsequent encounter: Secondary | ICD-10-CM

## 2014-10-03 DIAGNOSIS — M20012 Mallet finger of left finger(s): Secondary | ICD-10-CM

## 2014-10-03 MED ORDER — MELOXICAM 15 MG PO TABS: ORAL_TABLET | ORAL | Status: DC

## 2014-10-03 MED ORDER — TRAMADOL HCL 50 MG PO TABS: ORAL_TABLET | ORAL | Status: DC

## 2014-10-03 NOTE — Assessment & Plan Note (Signed)
Doing well at 4 weeks, I do expect likely 8 weeks of extension splinting before we allow her to increase her range of motion. We will repeat x-rays today and I'd like to see her back in about 4 weeks to further evaluate as well as start getting her range of motion back

## 2014-10-03 NOTE — Progress Notes (Signed)
  Subjective: Is now post 4 weeks of extension splinting of the left fifth distal interphalangeal joint, doing significantly better.   Objective: General: Well-developed, well-nourished, and in no acute distress. Left hand: Able to maintain full extension passively and actively, we have not tried any flexion today. Extension splint replaced.  Assessment/plan:

## 2014-10-05 ENCOUNTER — Ambulatory Visit: Payer: BLUE CROSS/BLUE SHIELD | Admitting: Sports Medicine

## 2015-05-16 ENCOUNTER — Other Ambulatory Visit: Payer: Self-pay | Admitting: Obstetrics and Gynecology

## 2015-05-16 DIAGNOSIS — R928 Other abnormal and inconclusive findings on diagnostic imaging of breast: Secondary | ICD-10-CM

## 2015-05-21 ENCOUNTER — Ambulatory Visit
Admission: RE | Admit: 2015-05-21 | Discharge: 2015-05-21 | Disposition: A | Discharge status: Home / Self Care | Payer: BLUE CROSS/BLUE SHIELD | Source: Ambulatory Visit | Attending: Obstetrics and Gynecology | Admitting: Obstetrics and Gynecology

## 2015-05-21 ENCOUNTER — Other Ambulatory Visit: Payer: Self-pay | Admitting: Obstetrics and Gynecology

## 2015-05-21 DIAGNOSIS — R928 Other abnormal and inconclusive findings on diagnostic imaging of breast: Secondary | ICD-10-CM

## 2015-05-23 ENCOUNTER — Other Ambulatory Visit: Payer: Self-pay | Admitting: Obstetrics and Gynecology

## 2015-05-23 DIAGNOSIS — R928 Other abnormal and inconclusive findings on diagnostic imaging of breast: Secondary | ICD-10-CM

## 2015-05-24 ENCOUNTER — Ambulatory Visit
Admission: RE | Admit: 2015-05-24 | Discharge: 2015-05-24 | Disposition: A | Discharge status: Home / Self Care | Payer: BLUE CROSS/BLUE SHIELD | Source: Ambulatory Visit | Attending: Obstetrics and Gynecology | Admitting: Obstetrics and Gynecology

## 2015-05-24 DIAGNOSIS — R928 Other abnormal and inconclusive findings on diagnostic imaging of breast: Secondary | ICD-10-CM

## 2015-06-12 ENCOUNTER — Other Ambulatory Visit: Payer: Self-pay | Admitting: General Surgery

## 2015-06-12 DIAGNOSIS — N6092 Unspecified benign mammary dysplasia of left breast: Secondary | ICD-10-CM

## 2015-06-19 ENCOUNTER — Other Ambulatory Visit: Payer: Self-pay | Admitting: General Surgery

## 2015-06-19 DIAGNOSIS — N6092 Unspecified benign mammary dysplasia of left breast: Secondary | ICD-10-CM

## 2015-07-12 ENCOUNTER — Encounter (HOSPITAL_BASED_OUTPATIENT_CLINIC_OR_DEPARTMENT_OTHER): Payer: Self-pay | Admitting: *Deleted

## 2015-07-18 ENCOUNTER — Ambulatory Visit
Admission: RE | Admit: 2015-07-18 | Discharge: 2015-07-18 | Disposition: A | Discharge status: Home / Self Care | Payer: BLUE CROSS/BLUE SHIELD | Source: Ambulatory Visit | Attending: General Surgery | Admitting: General Surgery

## 2015-07-18 ENCOUNTER — Other Ambulatory Visit: Payer: BLUE CROSS/BLUE SHIELD

## 2015-07-18 ENCOUNTER — Other Ambulatory Visit: Payer: Self-pay | Admitting: General Surgery

## 2015-07-18 ENCOUNTER — Ambulatory Visit (HOSPITAL_BASED_OUTPATIENT_CLINIC_OR_DEPARTMENT_OTHER)
Admission: RE | Admit: 2015-07-18 | Payer: BLUE CROSS/BLUE SHIELD | Source: Ambulatory Visit | Admitting: General Surgery

## 2015-07-18 ENCOUNTER — Encounter (HOSPITAL_BASED_OUTPATIENT_CLINIC_OR_DEPARTMENT_OTHER): Payer: Self-pay | Admitting: Anesthesiology

## 2015-07-18 DIAGNOSIS — N6092 Unspecified benign mammary dysplasia of left breast: Secondary | ICD-10-CM

## 2015-07-18 DIAGNOSIS — Z8571 Personal history of Hodgkin lymphoma: Secondary | ICD-10-CM

## 2015-07-18 HISTORY — DX: Gastro-esophageal reflux disease without esophagitis: K21.9

## 2015-07-18 HISTORY — DX: Major depressive disorder, single episode, unspecified: F32.9

## 2015-07-18 HISTORY — DX: Unspecified lump in the left breast, unspecified quadrant: N63.20

## 2015-07-18 HISTORY — DX: Other specified postprocedural states: Z98.890

## 2015-07-18 HISTORY — DX: Nausea with vomiting, unspecified: R11.2

## 2015-07-18 SURGERY — BREAST LUMPECTOMY WITH NEEDLE LOCALIZATION
Anesthesia: General | Site: Breast | Laterality: Left

## 2015-07-18 MED ORDER — MIDAZOLAM HCL 2 MG/2ML IJ SOLN
INTRAMUSCULAR | Status: AC
  Filled 2015-07-18: qty 2

## 2015-07-18 MED ORDER — FENTANYL CITRATE (PF) 100 MCG/2ML IJ SOLN
INTRAMUSCULAR | Status: AC
  Filled 2015-07-18: qty 2

## 2015-07-18 MED ORDER — BUPIVACAINE HCL (PF) 0.25 % IJ SOLN
INTRAMUSCULAR | Status: AC
  Filled 2015-07-18: qty 30

## 2015-07-18 MED ORDER — PROPOFOL 10 MG/ML IV BOLUS
INTRAVENOUS | Status: AC
  Filled 2015-07-18: qty 40

## 2015-07-18 MED ORDER — GADOBENATE DIMEGLUMINE 529 MG/ML IV SOLN
15.0000 mL | Freq: Once | INTRAVENOUS | Status: AC | PRN
  Administered 2015-07-18: 15 mL via INTRAVENOUS

## 2015-07-18 MED ORDER — LIDOCAINE HCL (CARDIAC) 20 MG/ML IV SOLN
INTRAVENOUS | Status: AC
  Filled 2015-07-18: qty 5

## 2015-07-18 MED ORDER — LIDOCAINE-EPINEPHRINE (PF) 1 %-1:200000 IJ SOLN
INTRAMUSCULAR | Status: AC
  Filled 2015-07-18: qty 30

## 2015-07-18 MED ORDER — ONDANSETRON HCL 4 MG/2ML IJ SOLN
INTRAMUSCULAR | Status: AC
  Filled 2015-07-18: qty 2

## 2015-07-18 MED ORDER — DEXAMETHASONE SODIUM PHOSPHATE 10 MG/ML IJ SOLN
INTRAMUSCULAR | Status: AC
  Filled 2015-07-18: qty 1

## 2015-07-20 ENCOUNTER — Other Ambulatory Visit: Payer: Self-pay | Admitting: General Surgery

## 2015-07-20 DIAGNOSIS — N6310 Unspecified lump in the right breast, unspecified quadrant: Secondary | ICD-10-CM

## 2015-07-24 ENCOUNTER — Inpatient Hospital Stay: Admission: RE | Admit: 2015-07-24 | Payer: BLUE CROSS/BLUE SHIELD | Source: Ambulatory Visit

## 2015-07-25 ENCOUNTER — Ambulatory Visit
Admission: RE | Admit: 2015-07-25 | Discharge: 2015-07-25 | Disposition: A | Discharge status: Home / Self Care | Payer: BLUE CROSS/BLUE SHIELD | Source: Ambulatory Visit | Attending: General Surgery | Admitting: General Surgery

## 2015-07-25 DIAGNOSIS — N6310 Unspecified lump in the right breast, unspecified quadrant: Secondary | ICD-10-CM

## 2015-07-25 MED ORDER — GADOBENATE DIMEGLUMINE 529 MG/ML IV SOLN
15.0000 mL | Freq: Once | INTRAVENOUS | Status: AC | PRN
  Administered 2015-07-25: 15 mL via INTRAVENOUS

## 2015-09-21 ENCOUNTER — Telehealth: Payer: Self-pay

## 2015-09-21 NOTE — Telephone Encounter (Signed)
Writer returned call to Dr. Sonny Dandy in reference to this patient.  Dr. Sonny Dandy would like Dr. Lindi Adie to see this patient for new diagnosis of breast cancer.  Dr. Sonny Dandy provides background as follows:  Pt originally treated by Dr. Sonny Dandy at the age of approx 40 for Stage 3 Hodgkins, enlarged retroperitoneal lymphadenopathy.  She received AVBD and MOPP.  After chemo, she still had enlarged lymphadenopathy.  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    Pt was experiencing some menopausal symptoms, went to see her gyn, found a spot in her left breast.  She was scheduled to have lumpectomy with Dr. Marlou Starks, which was cancelled by Dr. Glennon Mac due to her previous radiation.  Patient subsequently had bilateral mastectomies performed in [place].  He believes she is scheduled to have Reconstruction in [place] in May.    Pathology provided by Dr. Sonny Dandy: Left - DCIS  Right - ER+ PR+ HER2-, 8 mm, lymph node negative, Ki-67 5%, T1b.  (Chart reviewed - there is a pathology report on her chart from 07/25/15).  Dr. Sonny Dandy says that she has all her records and pathology reports from other facilities - confirmed this with pt.     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  Pt is having ovary and fallopian tubes removed at same time as re-construction in [place].  Pt is asking for advice as to whether she also needs her uterus removed, and what further treatment she may need.    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  Appt made for patient on Monday 3/13.  Note routed to MD.

## 2015-09-22 DIAGNOSIS — C50511 Malignant neoplasm of lower-outer quadrant of right female breast: Secondary | ICD-10-CM | POA: Insufficient documentation

## 2015-09-22 NOTE — Assessment & Plan Note (Addendum)
Rt Breast mass 07/18/15: MRI breast: 0.9 x 0.8 x 0.8 cm, with clumped NME 4 cm, 2 sites of Bx proven flat epithelial atypia, T1bN0 (stage 1A) Rt Breast Biopsy 07/25/15: IDC with calcs and DCIS and ALH, Er 100%, PR 100%, Ki 67 5%, Her 2 Neg Ratio 1.35 Bilateral mastectomies 08/28/2015: In [place], 1.5 cm residual high-grade DCIS Tis N0 stage 0 Final pathologic staging: T1a N0 stage IA  Pathology and radiology counseling:Discussed with the patient, the details of pathology including the type of breast cancer,the clinical staging, the significance of ER, PR and HER-2/neu receptors and the implications for treatment.   Recommendations: 1. Oncotype DX testing: We discussed that Oncotype DX is generally indicated for tumors that are much bigger. We will try to find out from her pathology the actual tumor size in the biopsy. There is tissue available we will send it for Oncotype testing.  2. Adjuvant antiestrogen therapy with tamoxifen until she has oophorectomy (patient is planning for June 2017) and then we can switch her to anastrozole  Oncotype counseling: I discussed Oncotype DX test. I explained to the patient that this is a 21 gene panel to evaluate patient tumors DNA to calculate recurrence score. This would help determine whether patient has high risk or intermediate risk or low risk breast cancer. She understands that if her tumor was found to be high risk, she would benefit from systemic chemotherapy. If low risk, no need of chemotherapy. If she was found to be intermediate risk, we would need to evaluate the score as well as other risk factors and determine if an abbreviated chemotherapy may be of benefit.  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  General cancer surveillance: 1. Recommended colonoscopy every 5 years because of abdominal radiation 2. Patient plans to undergo hysterectomy with bilateral salpingo-oophorectomy. 3. No imaging studies required for the breast  because she had bilateral mastectomies. 4. There is no clear evidence of routine scans to look for lung cancer. I do not intend to do scans unless it is absolutely necessary because of all her prior radiation. 5. From cardiac standpoint, I recommended that she see cardiology. I will refer her to Dr. Haroldine Laws for cardiac surveillance.  I will send a prescription for tamoxifen at this time. I will see her back in July.

## 2015-09-24 ENCOUNTER — Ambulatory Visit (HOSPITAL_BASED_OUTPATIENT_CLINIC_OR_DEPARTMENT_OTHER): Payer: BLUE CROSS/BLUE SHIELD | Admitting: Hematology and Oncology

## 2015-09-24 VITALS — BP 114/62 | HR 87 | Temp 98.0°F | Resp 18 | Ht 65.0 in | Wt 166.8 lb

## 2015-09-24 DIAGNOSIS — Z8571 Personal history of Hodgkin lymphoma: Secondary | ICD-10-CM

## 2015-09-24 DIAGNOSIS — C8173 Other classical Hodgkin lymphoma, intra-abdominal lymph nodes: Secondary | ICD-10-CM

## 2015-09-24 DIAGNOSIS — C50511 Malignant neoplasm of lower-outer quadrant of right female breast: Secondary | ICD-10-CM

## 2015-09-24 DIAGNOSIS — Z85850 Personal history of malignant neoplasm of thyroid: Secondary | ICD-10-CM | POA: Diagnosis not present

## 2015-09-24 DIAGNOSIS — Z85820 Personal history of malignant melanoma of skin: Secondary | ICD-10-CM | POA: Diagnosis not present

## 2015-09-24 NOTE — Progress Notes (Signed)
[place] NOTE  Patient Care Team: [name], MD as PCP - General (Internal Medicine) Arvella Nigh, MD (Obstetrics and Gynecology) [name], NP as Nurse Practitioner (Obstetrics and Gynecology) [name], MD as Consulting Physician (Sports Medicine)  CHIEF COMPLAINTS/PURPOSE OF CONSULTATION:  Newly diagnosed breast cancer  HISTORY OF PRESENTING ILLNESS:  Stacy Chung 45 y.o. female is here because of recent diagnosis of right breast abnormalities detected on breast MRI on 07/18/2015. Initial biopsy showed epithelial atypia. But it also showed invasive ductal carcinoma on the repeat biopsy in 07/25/2015. There was DCIS with ER PR positive HER-2 negative and Ki-67 5%. 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 Patient underwent bilateral mastectomies with reconstruction in [place]. She has done extremely well from the surgery and was in tremendous brace of the [facility]. She has more plastic surgery appointments coming up in June. At that time she plans to undergo bilateral salpingo-oophorectomy with hysterectomy.   Patient has a prior history of Hodgkin's lymphoma and underwent extensive chemotherapy with ABVD and MOPP and underwent radiation to the chest and the mantle formation in addition to that radiation to the retroperitoneal abdominal lymph nodes. Over the years she had melanoma and thyroid cancer which were both resected.  I reviewed her records extensively and collaborated the history with the patient.  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 treated with resection   09/27/2002 Pathology  Results Thyroid cancer treated with thyroidectomy    Breast cancer of lower-outer quadrant of right female breast ([place])   07/18/2015 Breast MRI Rt Breast mass 0.9 x 0.8 x 0.8 cm, with clumped NME 4 cm, 2 sites of Bx proven flat epithelial atypia, T1bN0 (stage 1A)   07/25/2015 Initial Diagnosis Rt Breast Biopsy: IDC with calcs and DCIS and ALH, Er 100%, PR 100%, Ki 67 5%, Her 2 Neg Ratio 1.35   07/26/2015 Procedure 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 Increased frequency of headaches   . Migraines   . Thyroid disease   . Anxiety   . Hx: UTI (urinary tract infection)   . Allergy   . Carcinoma of thyroid ([place])   . History of chicken pox   . Hodgkin's disease   . PONV (postoperative nausea and vomiting)   . Depression   . GERD (gastroesophageal reflux disease)   . Left breast mass     SURGICAL HISTORY: Past Surgical History  Procedure Laterality Date  . Ureteroplasty  1977    malformed ureter  . Thoracotomy  1991    collasped lung, spontaneous  . Thyroidectomy  2004    nodules, carcinoma  . Laparoscopic splenectomy  1987    Hodgkins Disease  . Foot tendon surgery Left     SOCIAL HISTORY: Social History   Social History  . Marital Status: Married    Spouse Name: N/A  . Number of Children: 2  . Years of Education: 16   Occupational History  . Chief Strategy Officer  Social History Main Topics  . Smoking status: Never Smoker   . Smokeless tobacco: Not on file  . Alcohol Use: Yes     Comment: social  . Drug Use: No  . Sexual Activity: Yes    Birth Control/ Protection: IUD   Other Topics Concern  . Not on file   Social History Narrative   Regular exercise-no    FAMILY HISTORY: Family History  Problem  Relation Age of Onset  . Alcohol abuse Maternal Aunt   . Cancer Maternal Uncle     Prostate Cancer  . Cancer Maternal Grandmother     Colon Cancer  . Cancer Maternal Grandfather     Prostate Cancer  . Cancer Other     FH of Breast Cancer, Ovarian/Uterine Cancer    ALLERGIES:  is allergic to macrodantin and pertussis vaccines.  MEDICATIONS:  Current Outpatient Prescriptions  Medication Sig Dispense Refill  . buPROPion (WELLBUTRIN SR) 150 MG 12 hr tablet Take 150 mg by mouth 2 (two) times daily.      . Cholecalciferol (VITAMIN D3) 10000 UNITS capsule Take 10,000 Units by mouth daily. Only 5 days a week     . levothyroxine (SYNTHROID, LEVOTHROID) 75 MCG tablet Take 75 mcg by mouth daily.      . pantoprazole (PROTONIX) 20 MG tablet Take 20 mg by mouth daily.      . sertraline (ZOLOFT) 50 MG tablet Take 50 mg by mouth daily.    . tamoxifen (NOLVADEX) 20 MG tablet Take 1 tablet (20 mg total) by mouth daily. 90 tablet 1  . ZOLMitriptan (ZOMIG) 2.5 MG tablet Take 2.5 mg by mouth as needed.       No current facility-administered medications for this visit.    REVIEW OF SYSTEMS:   Constitutional: Denies fevers, chills or abnormal night sweats Eyes: Denies blurriness of vision, double vision or watery eyes Ears, nose, mouth, throat, and face: Denies mucositis or sore throat Respiratory: Denies cough, dyspnea or wheezes Cardiovascular: Denies palpitation, chest discomfort or lower extremity swelling Gastrointestinal:  Denies nausea, heartburn or change in bowel habits Skin: Denies abnormal skin rashes Lymphatics: Denies new lymphadenopathy or easy bruising Neurological:Denies numbness, tingling or new weaknesses Behavioral/Psych: Mood is stable, no new changes  Breast:  Denies any palpable lumps or discharge All other systems were reviewed with the patient and are negative.  PHYSICAL EXAMINATION: ECOG PERFORMANCE STATUS: 0 - Asymptomatic  Filed Vitals:   09/24/15 1604  BP: 114/62   Pulse: 87  Temp: 98 F (36.7 C)  Resp: 18   Filed Weights   09/24/15 1604  Weight: 166 lb 12.8 oz (75.66 kg)    GENERAL:alert, no distress and comfortable SKIN: skin color, texture, turgor are normal, no rashes or significant lesions EYES: normal, conjunctiva are pink and non-injected, sclera clear OROPHARYNX:no exudate, no erythema and lips, buccal mucosa, and tongue normal  NECK: supple, thyroid normal size, non-tender, without nodularity LYMPH:  no palpable lymphadenopathy in the cervical, axillary or inguinal LUNGS: clear to auscultation and percussion with normal breathing effort HEART: regular rate & rhythm and no murmurs and no lower extremity edema ABDOMEN:abdomen soft, non-tender and normal bowel sounds Musculoskeletal:no cyanosis of digits and no clubbing  PSYCH: alert & oriented x 3 with fluent speech NEURO: no focal motor/sensory deficits  LABORATORY DATA:  I have reviewed the data as listed No results found for: WBC, HGB, HCT, MCV, PLT Lab Results  Component Value Date   NA 134* 09/06/2013   K 4.1 09/06/2013  CL 103 09/06/2013   CO2 22 09/06/2013   ASSESSMENT AND PLAN:  Breast cancer of lower-outer quadrant of right female breast ([place]) Rt Breast mass 07/18/15: MRI breast: 0.9 x 0.8 x 0.8 cm, with clumped NME 4 cm, 2 sites of Bx proven flat epithelial atypia, T1bN0 (stage 1A) Rt Breast Biopsy 07/25/15: IDC with calcs and DCIS and ALH, Er 100%, PR 100%, Ki 67 5%, Her 2 Neg Ratio 1.35 Bilateral mastectomies 08/28/2015: In [place], 1.5 cm residual high-grade DCIS Tis N0 stage 0 Final pathologic staging: T1a N0 stage IA  Pathology and radiology counseling:Discussed with the patient, the details of pathology including the type of breast cancer,the clinical staging, the significance of ER, PR and HER-2/neu receptors and the implications for treatment.   Recommendations: 1. Oncotype DX testing: We discussed that Oncotype DX is generally indicated for tumors that  are much bigger. We will try to find out from her pathology the actual tumor size in the biopsy. There is tissue available we will send it for Oncotype testing.  2. Adjuvant antiestrogen therapy with tamoxifen until she has oophorectomy (patient is planning for June 2017) and then we can switch her to anastrozole  Oncotype counseling: I discussed Oncotype DX test. I explained to the patient that this is a 21 gene panel to evaluate patient tumors DNA to calculate recurrence score. This would help determine whether patient has high risk or intermediate risk or low risk breast cancer. She understands that if her tumor was found to be high risk, she would benefit from systemic chemotherapy. If low risk, no need of chemotherapy. If she was found to be intermediate risk, we would need to evaluate the score as well as other risk factors and determine if an abbreviated chemotherapy may be of benefit.  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  General cancer surveillance: 1. Recommended colonoscopy every 5 years because of abdominal radiation 2. Patient plans to undergo hysterectomy with bilateral salpingo-oophorectomy. 3. No imaging studies required for the breast because she had bilateral mastectomies. 4. There is no clear evidence of routine scans to look for lung cancer. I do not intend to do scans unless it is absolutely necessary because of all her prior radiation. 5. From cardiac standpoint, I recommended that she see cardiology. I will refer her to Dr. Haroldine Laws for cardiac surveillance.  I will send a prescription for tamoxifen at this time. I will see her back in July.  All questions were answered. The patient knows to call the clinic with any problems, questions or concerns.    Rulon Eisenmenger, MD 09/25/2015

## 2015-09-25 ENCOUNTER — Encounter: Payer: Self-pay | Admitting: Hematology and Oncology

## 2015-09-25 ENCOUNTER — Encounter: Payer: Self-pay | Admitting: *Deleted

## 2015-09-25 MED ORDER — TAMOXIFEN CITRATE 20 MG PO TABS: 20.0000 mg | ORAL_TABLET | Freq: Every day | ORAL | Status: DC

## 2015-09-25 NOTE — Progress Notes (Signed)
Received path report from [facility], reviewed by Dr. Lindi Adie, sent to scan.

## 2015-09-26 ENCOUNTER — Telehealth: Payer: Self-pay | Admitting: Hematology and Oncology

## 2015-09-26 ENCOUNTER — Other Ambulatory Visit: Payer: Self-pay

## 2015-09-26 DIAGNOSIS — C50511 Malignant neoplasm of lower-outer quadrant of right female breast: Secondary | ICD-10-CM

## 2015-09-26 MED ORDER — TAMOXIFEN CITRATE 20 MG PO TABS: ORAL_TABLET | ORAL | Status: DC

## 2015-09-26 NOTE — Telephone Encounter (Signed)
Patient called c/o a possible reaction to taking tamoxifen for the first time.  Patient states she took the tamoxifen last night between 9 and 1030, went to bed.  At 2:00 am patient describes an "itchy feeling in her shoulder area" .  Within 30 minutes she was itchy "all over her back from her shoulder blades to the small of her back".  She states that she took a zyrtec and woke her husband up who told her that her back was "Production designer, theatre/television/film red".  She had him rub OTC hydrocortisone cream all over her back.  Within 30- 45 minutes her back no longer was itching and the redness had gone away.  Patient is wanting to know what Dr. Lindi Adie would like to do. Writer spoke with Dr. Lindi Adie who would like her to try a different drug company brand.  Patient had researched the medication and found out that [facility] carries a different brand name from a different drug company then [employer].  [facility] is where she purchased the tamoxifen she took last night.  Per Dr. Lindi Adie writer eprescribed a prescription over to the [facility].  Patient is requesting only #30 tabs in case she reacts again.  Dr. Lindi Adie would like patient to take 1/2 tablets once a day for seven days and if she is tolerating the medication to increase the dose to one tablet daily.  Patient will update Dr. Lindi Adie.

## 2015-09-26 NOTE — Telephone Encounter (Signed)
Left vm for patient to inform her of July appt date/time per 3/14 pof. Left message for Dr. Scharlene Gloss schedulers to set appt

## 2015-09-27 ENCOUNTER — Telehealth: Payer: Self-pay | Admitting: *Deleted

## 2015-09-27 NOTE — Telephone Encounter (Signed)
Received order per Dr. Lindi Adie for oncotype testing. Requisition sent to pathology. Received by Tammy. PAC sent to [facility].

## 2015-10-03 ENCOUNTER — Telehealth: Payer: Self-pay

## 2015-10-03 NOTE — Telephone Encounter (Signed)
Returned call to patient re: allergic reaction to tamoxifen referenced in note on 09/26/15.  Per Dr. Lindi Adie, if zyrtec manages the reaction, she can try taking prior to tamoxifen.  If that does not work or symptoms worsen, patient should stop tamoxifen.  I advised patient there is not an alternative to the tamoxifen at this time due to her pre-menopausal status.  Pt voiced understanding and report she planning hysterectomy in a few months.

## 2015-10-04 ENCOUNTER — Encounter: Payer: BLUE CROSS/BLUE SHIELD | Admitting: Genetic Counselor

## 2015-10-04 ENCOUNTER — Other Ambulatory Visit: Payer: BLUE CROSS/BLUE SHIELD

## 2015-10-12 ENCOUNTER — Telehealth: Payer: Self-pay | Admitting: *Deleted

## 2015-10-12 ENCOUNTER — Encounter (HOSPITAL_COMMUNITY): Payer: Self-pay

## 2015-10-12 NOTE — Telephone Encounter (Signed)
Spoke with patient to give her oncotype results of 6.  She will not need chemo.  Discussed survivorship.  She would prefer her care plan to be mailed.

## 2015-10-15 ENCOUNTER — Encounter: Payer: Self-pay | Admitting: Genetic Counselor

## 2015-10-15 ENCOUNTER — Other Ambulatory Visit: Payer: Self-pay | Admitting: *Deleted

## 2015-10-15 ENCOUNTER — Telehealth: Payer: Self-pay | Admitting: *Deleted

## 2015-10-15 DIAGNOSIS — C50511 Malignant neoplasm of lower-outer quadrant of right female breast: Secondary | ICD-10-CM

## 2015-10-15 MED ORDER — CETIRIZINE HCL 5 MG PO TABS: 5.0000 mg | ORAL_TABLET | Freq: Every day | ORAL | Status: DC

## 2015-10-15 MED ORDER — LEVOCETIRIZINE DIHYDROCHLORIDE 5 MG PO TABS: 5.0000 mg | ORAL_TABLET | Freq: Every evening | ORAL | Status: DC

## 2015-10-15 NOTE — Telephone Encounter (Signed)
Spoke with patient to inform her I will mail the prescriptions she has requested for Zyrtec and Xyzal.  She has some questions regarding her initial biopsy and the IDC component.  She states pathology was supposed to look at the slides from her biopsy. Dr. Lindi Adie will call to verify.

## 2015-10-16 ENCOUNTER — Encounter (HOSPITAL_COMMUNITY): Payer: Self-pay | Admitting: Internal Medicine

## 2015-10-16 ENCOUNTER — Ambulatory Visit (HOSPITAL_COMMUNITY)
Admission: RE | Admit: 2015-10-16 | Discharge: 2015-10-16 | Disposition: A | Discharge status: Home / Self Care | Payer: BLUE CROSS/BLUE SHIELD | Source: Ambulatory Visit | Attending: Internal Medicine | Admitting: Internal Medicine

## 2015-10-16 ENCOUNTER — Ambulatory Visit (HOSPITAL_BASED_OUTPATIENT_CLINIC_OR_DEPARTMENT_OTHER)
Admission: RE | Admit: 2015-10-16 | Discharge: 2015-10-16 | Disposition: A | Discharge status: Home / Self Care | Payer: BLUE CROSS/BLUE SHIELD | Source: Ambulatory Visit | Attending: Internal Medicine | Admitting: Internal Medicine

## 2015-10-16 VITALS — BP 120/72 | HR 75 | Wt 169.8 lb

## 2015-10-16 DIAGNOSIS — C50511 Malignant neoplasm of lower-outer quadrant of right female breast: Secondary | ICD-10-CM | POA: Diagnosis not present

## 2015-10-16 NOTE — Progress Notes (Signed)
Patient ID: Stacy Chung, female   DOB: November 07, 1970, 45 y.o.   MRN: [id number]    Advanced Heart Failure/Cardio-Oncology Clinic Note   Referring Physician: Dr. Lindi Adie Primary Care: Dr. Osborne Casco Primary Oncologist: Dr. Lindi Adie Primary Cardiologist: Dr Mare Ferrari (only saw a few times)  HPI: Stacy Chung is a 45 y.o. with diagnosis of right breast Cancer after abnormalities were detected on breast MRI 07/18/15. Initial biopsy showed epithelial atypia. But it also showed invasive ductal carcinoma on the repeat biopsy in 07/25/2015. There was DCIS with ER PR positive HER-2 negative and Ki-67 5%. 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 Patient underwent bilateral mastectomies with reconstruction in [place].  She has more plastic surgery appointments coming up in June. At that time she plans to undergo bilateral salpingo-oophorectomy with hysterectomy.   Pt also has a history of Hodgkin's lymphoma and underwent extensive chemotherapy with ABVD and MOPP and underwent radiation to the chest and the mantle formation in addition to that radiation to the retroperitoneal abdominal lymph nodes. Over the years she had melanoma and thyroid cancer which were both resected. (basicaly radiation from neck to hips per patient)  Breast CA profile :  Rt Breast mass 07/18/15: MRI breast: 0.9 x 0.8 x 0.8 cm, with clumped NME 4 cm, 2 sites of Bx proven flat epithelial atypia, T1bN0 (stage 1A) Rt Breast Biopsy 07/25/15: IDC with calcs and DCIS and ALH, Er 100%, PR 100%, Ki 67 5%, Her 2 Neg Ratio 1.35 Bilateral mastectomies 08/28/2015: In [place], 1.5 cm residual high-grade DCIS Tis N0 stage 0 Final pathologic staging: T1a N0 stage IA  Her oncotype results were 6. She will NOT need chemo.   She presents today to establish in the cardio-oncology clinic. She will not need chemo for her breast cancer, but was previously treated as a child for Hodgkins lymphoma and had chemo with ABVD and MOPP. She  states that she does have a distant history of paroxysmal afib, seen by Dr Mare Ferrari several years ago. Still occasionally goes into it, feels like tachypalpitations.  Usually only 1-2 beats, but sometimes lasts up to 20 seconds. She does feel like she has it several times a week. Denies DOE, orthopnea, bendopnea, CP, or PND. Denies peripheral edema.   Echo today (10/16/15) shows 60-65% with grade 1 DD, Lat S' 13.6, GLS -23.5  Lives in [place]. 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 Travels a lot. Lives at home with husband and son.    Review of Systems: [y] = yes, [ ]  = no   General: Weight gain [ ] ; Weight loss [ ] ; Anorexia [ ] ; Fatigue [ ] ; Fever [ ] ; Chills [ ] ; Weakness [ ]   Cardiac: Chest pain/pressure [ ] ; Resting SOB [ ] ; Exertional SOB [ ] ; Orthopnea [ ] ; Pedal Edema [ ] ; Palpitations [ ] ; Syncope [ ] ; Presyncope [ ] ; Paroxysmal nocturnal dyspnea[ ]   Pulmonary: Cough [ ] ; Wheezing[ ] ; Hemoptysis[ ] ; Sputum [ ] ; Snoring [ ]   GI: Vomiting[ ] ; Dysphagia[ ] ; Melena[ ] ; Hematochezia [ ] ; Heartburn[ ] ; Abdominal pain [ ] ; Constipation [ ] ; Diarrhea [ ] ; BRBPR [ ]   GU: Hematuria[ ] ; Dysuria [ ] ; Nocturia[ ]   Vascular: Pain in legs with walking [ ] ; Pain in feet with lying flat [ ] ; Non-healing sores [ ] ; Stroke [ ] ; TIA [ ] ; Slurred speech [ ] ;  Neuro: Headaches[ ] ; Vertigo[ ] ; Seizures[ ] ; Paresthesias[ ] ;Blurred vision [ ] ; Diplopia [ ] ; Vision changes [ ]   Ortho/Skin: Arthritis [ ] ; Joint pain [ ] ; Muscle pain [ ] ; Joint swelling [ ] ; Back Pain [ ] ; Rash [ ]   Psych: Depression[ ] ; Anxiety[ ]   Heme: Bleeding problems [ ] ; Clotting disorders [ ] ; Anemia [ ]   Endocrine: Diabetes [ ] ; Thyroid dysfunction[y]  Past Medical History  Diagnosis Date  . Increased frequency of headaches   . Migraines   . Thyroid disease   . Anxiety   . Hx: UTI (urinary tract infection)   . Allergy   . Carcinoma of thyroid ([place])   . History of chicken pox   . Hodgkin's disease   . PONV (postoperative nausea and  vomiting)   . Depression   . GERD (gastroesophageal reflux disease)   . Left breast mass     Current Outpatient Prescriptions  Medication Sig Dispense Refill  . buPROPion (WELLBUTRIN SR) 150 MG 12 hr tablet Take 150 mg by mouth 2 (two) times daily.      . cetirizine (ZYRTEC) 5 MG tablet Take 1 tablet (5 mg total) by mouth daily. 30 tablet 3  . Cholecalciferol (VITAMIN D3) 10000 UNITS capsule Take 10,000 Units by mouth daily. Only 5 days a week     . levocetirizine (XYZAL) 5 MG tablet Take 1 tablet (5 mg total) by mouth every evening. 30 tablet 3  . levothyroxine (SYNTHROID, LEVOTHROID) 75 MCG tablet Take 75 mcg by mouth daily.      . pantoprazole (PROTONIX) 20 MG tablet Take 20 mg by mouth daily.      . sertraline (ZOLOFT) 50 MG tablet Take 50 mg by mouth daily.    . tamoxifen (NOLVADEX) 20 MG tablet Take 1/2 tablet for one week, then one tablet daily as directed. 30 tablet 3  . ZOLMitriptan (ZOMIG) 2.5 MG tablet Take 2.5 mg by mouth as needed.       No current facility-administered medications for this encounter.    Allergies  Allergen Reactions  . Other Rash    "bandaides"  . Macrodantin   . Pertussis Vaccines   . Tape Rash    "plastic tape"      Social History   Social History  . Marital Status: Married    Spouse Name: N/A  . Number of Children: 2  . Years of Education: 16   Occupational History  . Chief Strategy Officer    Social History Main Topics  . Smoking status: Never Smoker   . Smokeless tobacco: Not on file  . Alcohol Use: Yes     Comment: social  . Drug Use: No  . Sexual Activity: Yes    Birth Control/ Protection: IUD   Other Topics Concern  . Not on file   Social History Narrative   Regular exercise-no      Family History  Problem Relation Age of Onset  . Alcohol abuse Maternal Aunt   . Cancer Maternal Uncle     Prostate Cancer  . Cancer Maternal Grandmother     Colon Cancer  . Cancer Maternal Grandfather     Prostate Cancer  . Cancer  Other     FH of Breast Cancer, Ovarian/Uterine Cancer    Filed Vitals:   10/16/15 1216  BP: 120/72  Pulse: 75  Weight: 169 lb 12 oz (76.998 kg)  SpO2: 100%    PHYSICAL EXAM: General:  Well appearing. No respiratory difficulty HEENT: normal Neck: supple. no JVD. Carotids 2+ bilat; no bruits. No lymphadenopathy or thyromegaly appreciated. Cor: PMI nondisplaced. Regular rate & rhythm. No  rubs, gallops or murmurs. Lungs: clear Abdomen: soft, nontender, nondistended. No hepatosplenomegaly. No bruits or masses. Good bowel sounds. Extremities: no cyanosis, clubbing, rash, edema Neuro: alert & oriented x 3, cranial nerves grossly intact. moves all 4 extremities w/o difficulty. Affect pleasant.  ECG: NSR 80 bpm  ASSESSMENT & PLAN:  1. Breast cancer of lower-outer quadrant of right female breast - On tamoxifen. Will not need chemo. 2. Hx of Hodgkins Lymphoma - Treated with ABVD and MOPP.   - Echo today shows 60-65% with grade 1 DD, Lat S' 13.6, GLS -23.5 3. Atrial fibrillation, paroxysmal  - Distant history per patient.  - NSR by EKG and exam. ? If she just had palpitations and PVCs/PACs.   Legrand Como [address]" [place], PA-C 10/16/2015 12:19 PM   Patient seen and examined with Oda Kilts, PA-C. We discussed all aspects of the encounter. I agree with the assessment and plan as stated above.   32 y/o with h/o childhood Hodgkin's treated with Adriamycin. Now diagnosed with breast CA. Treated with surgical resection and tamoxifen only. I reviewed echo personally and is completely normal except grade 1 DD. Will not need further monitoring at this point. We did discuss the small risk of very delayed Adriamycin toxicity but currently no evidence of any cardiac sequelae.   Miranda Garber,MD 6:16 PM

## 2015-10-16 NOTE — Progress Notes (Signed)
  Echocardiogram 2D Echocardiogram has been performed.  Stacy Chung 10/16/2015, 12:05 PM

## 2015-10-16 NOTE — Patient Instructions (Signed)
Follow up as needed

## 2015-10-31 ENCOUNTER — Encounter: Payer: Self-pay | Admitting: Nurse Practitioner

## 2015-10-31 DIAGNOSIS — C50511 Malignant neoplasm of lower-outer quadrant of right female breast: Secondary | ICD-10-CM

## 2015-10-31 NOTE — Progress Notes (Signed)
The Survivorship Care Plan was mailed to Stacy Chung as she reported not being able to come in to the Survivorship Clinic for an in-person visit at this time. A letter was mailed to her outlining the purpose of the content of the care plan, as well as encouraging her to reach out to me with any questions or concerns.  My business card was included in the correspondence to the patient as well.  A copy of the care plan was also routed/faxed/mailed to [name], MD, the patient's PCP.  I will not be placing any follow-up appointments to the Survivorship Clinic for Stacy Chung, but I am happy to see her at any time in the future for any survivorship concerns that may arise. Thank you for allowing me to participate in her care!  Kenn File, [place] [phone]

## 2015-11-11 ENCOUNTER — Telehealth: Payer: Self-pay | Admitting: Hematology and Oncology

## 2015-11-11 NOTE — Telephone Encounter (Signed)
s.w. pt and advised on 7.13 moved to 7.18 due to MD on call....pt ok and aware of new d.t

## 2015-11-19 ENCOUNTER — Ambulatory Visit (INDEPENDENT_AMBULATORY_CARE_PROVIDER_SITE_OTHER): Payer: BLUE CROSS/BLUE SHIELD | Admitting: Sports Medicine

## 2015-11-19 VITALS — BP 120/80 | HR 80 | Wt 166.0 lb

## 2015-11-19 DIAGNOSIS — M67442 Ganglion, left hand: Secondary | ICD-10-CM | POA: Diagnosis not present

## 2015-11-19 NOTE — Assessment & Plan Note (Signed)
Left index finger, injection and percutaneous fenestration as above, return as needed.

## 2015-11-19 NOTE — Progress Notes (Signed)
   Subjective:    I'm seeing this patient as a consultation for:  Dr. Osborne Casco  CC: Left finger nodule  HPI: This is a pleasant 45 year old female, there has been a change in her medical history, she is post bilateral mastectomy with reconstruction for stage I cancer. For the past several weeks she's having a nodule under her left index finger when driving at the proximal interphalangeal joint from a volar aspect. Pain is moderate, persistent.   Past medical history, Surgical history, Family history not pertinant except as noted below, Social history, Allergies, and medications have been entered into the medical record, reviewed, and no changes needed.   Review of Systems: No headache, visual changes, nausea, vomiting, diarrhea, constipation, dizziness, abdominal pain, skin rash, fevers, chills, night sweats, weight loss, swollen lymph nodes, body aches, joint swelling, muscle aches, chest pain, shortness of breath, mood changes, visual or auditory hallucinations.   Objective:   General: Well Developed, well nourished, and in no acute distress.  Neuro/Psych: Alert and oriented x3, extra-ocular muscles intact, able to move all 4 extremities, sensation grossly intact. Skin: Warm and dry, no rashes noted.  Respiratory: Not using accessory muscles, speaking in full sentences, trachea midline.  Cardiovascular: Pulses palpable, no extremity edema. Abdomen: Does not appear distended. Left index finger: Palpable ganglion cyst at the volar PIP. Minimally tender.  Procedure: Real-time Ultrasound Guided Injection of left volar index finger ganglion cyst Device: GE Logiq E  Verbal informed consent obtained.  Time-out conducted.  Noted no overlying erythema, induration, or other signs of local infection.  Skin prepped in a sterile fashion.  Local anesthesia: Topical Ethyl chloride.  With sterile technique and under real time ultrasound guidance:  25-gauge needle advanced into the flexor tendon  sheath, a bit of medication was injected, I then redirected into the ganglion cyst visible just deep to the flexor digitorum profundus tendon. Additional medication was injected for a total of 1/2 mL lidocaine, 1/2 mL kenalog 40. Completed without difficulty  Pain immediately resolved suggesting accurate placement of the medication.  Advised to call if fevers/chills, erythema, induration, drainage, or persistent bleeding.  Images permanently stored and available for review in the ultrasound unit.  Impression: Technically successful ultrasound guided injection.  Impression and Recommendations:   This case required medical decision making of moderate complexity.

## 2015-11-20 ENCOUNTER — Encounter: Payer: Self-pay | Admitting: Genetic Counselor

## 2015-11-20 DIAGNOSIS — Z1379 Encounter for other screening for genetic and chromosomal anomalies: Secondary | ICD-10-CM | POA: Insufficient documentation

## 2015-11-21 ENCOUNTER — Ambulatory Visit (HOSPITAL_BASED_OUTPATIENT_CLINIC_OR_DEPARTMENT_OTHER): Payer: BLUE CROSS/BLUE SHIELD | Admitting: Genetic Counselor

## 2015-11-21 ENCOUNTER — Other Ambulatory Visit: Payer: BLUE CROSS/BLUE SHIELD

## 2015-11-21 ENCOUNTER — Encounter: Payer: Self-pay | Admitting: Genetic Counselor

## 2015-11-21 DIAGNOSIS — C50511 Malignant neoplasm of lower-outer quadrant of right female breast: Secondary | ICD-10-CM

## 2015-11-21 DIAGNOSIS — Z85820 Personal history of malignant melanoma of skin: Secondary | ICD-10-CM

## 2015-11-21 DIAGNOSIS — Z8571 Personal history of Hodgkin lymphoma: Secondary | ICD-10-CM | POA: Diagnosis not present

## 2015-11-21 DIAGNOSIS — Z8041 Family history of malignant neoplasm of ovary: Secondary | ICD-10-CM

## 2015-11-21 DIAGNOSIS — C4490 Unspecified malignant neoplasm of skin, unspecified: Secondary | ICD-10-CM | POA: Insufficient documentation

## 2015-11-21 DIAGNOSIS — Z803 Family history of malignant neoplasm of breast: Secondary | ICD-10-CM

## 2015-11-21 DIAGNOSIS — Z8042 Family history of malignant neoplasm of prostate: Secondary | ICD-10-CM

## 2015-11-21 DIAGNOSIS — Z1379 Encounter for other screening for genetic and chromosomal anomalies: Secondary | ICD-10-CM

## 2015-11-21 DIAGNOSIS — C4359 Malignant melanoma of other part of trunk: Secondary | ICD-10-CM

## 2015-11-21 DIAGNOSIS — Z85850 Personal history of malignant neoplasm of thyroid: Secondary | ICD-10-CM

## 2015-11-21 DIAGNOSIS — C801 Malignant (primary) neoplasm, unspecified: Secondary | ICD-10-CM | POA: Insufficient documentation

## 2015-11-21 DIAGNOSIS — C73 Malignant neoplasm of thyroid gland: Secondary | ICD-10-CM

## 2015-11-21 DIAGNOSIS — C8173 Other classical Hodgkin lymphoma, intra-abdominal lymph nodes: Secondary | ICD-10-CM

## 2015-11-21 DIAGNOSIS — Z800 Family history of malignant neoplasm of digestive organs: Secondary | ICD-10-CM

## 2015-11-21 NOTE — Progress Notes (Signed)
GENETIC TEST RESULTS   Patient Name: Stacy Chung Patient Age: 45 y.o. Encounter Date: 11/21/2015  Referring Provider: [name], MD Stacy Salina, MD    Stacy Chung was seen in the [facility] on Nov 21, 2015 due to a personal and family history of cancer and concern regarding a hereditary predisposition to cancer in the family. Stacy Chung was diagnosed with breast cancer and was seen in the oncology clinic on September 14, 2015.  Stacy Chung referred Stacy Chung to  [facility] based on her genetic test result that was performed through her GYN MD.  FAMILY HISTORY:  We obtained a detailed, 4-generation family history.  Significant diagnoses are listed below: Family History  Problem Relation Age of Onset  . Alcohol abuse Maternal Aunt   . Prostate cancer Maternal Uncle 2  . Colon cancer Maternal Grandmother     dx in her 23s  . Prostate cancer Maternal Grandfather     dx in his 73s  . Cancer Other     FH of Breast Cancer, Ovarian/Uterine Cancer  . Heart disease Maternal Uncle     66s  . Breast cancer Other     MGFs sister - reportedly BRCA+  . Ovarian cancer Other     PGFs mother  . Ovarian cancer Other     PGFs maternal aunt  . Ovarian cancer Other     PGFs maternal grandmohter  . Breast cancer Other     MGMs sister  . Breast cancer Other     MGFs mother    The patient has two boys who are healthy and cancer free.  She has one sister and one brother.  Her sister has been diagnosed with a BRIP1 mutation as well, and she is cancer free.  The patient's parents are both healthy, her mother is 28 and her father is 23.  Her mother has three brothers and one sister.  One brother died at 73 from a MVA. Another brother died from heart disease, and the third brother has prostate cancer.  The patient's maternal grandmother died of colon cancer and her grandfather died of prostate cancer in his 27s.  Her MGMs sister had breast cancer and her MGFs mother had breast  cancer.  The patient's father has one maternal half sister and a full brother and sister.  None of these individuals have cancer.  Her paternal grandmother died in her 82s and her grandfather is alive in his 35s.  He has a sister who had breast cancer and reportedly has a BRCA mutation.  The patient's MGFs mother, his maternal aunt and his maternal grandmother were all were diagnosed with ovarian cancer.   Patient's maternal ancestors are of [place], [place] and [place] descent, and paternal ancestors are of [place], [place] and [place] descent. There is no reported Ashkenazi Jewish ancestry. There is no known consanguinity.  GENETIC TESTING:  Stacy Chung had the Inherited Cancer Screen through [employer]. The Inherited cancer gene panel offered by [facility] includes sequencing and rearrangement analysis for the following 36 genes:   APC, ATM, BARD1, BMPR1A, BRCA1, BRCA2, BRIP1, CDH1, CDK4, CDKN2A, CHEK2, EPCAM, GREM1, MEN1, MLH1, MRE11A, MSH2, MSH6, MUTYH, NBN, PALB2, PMS2, POLD1, POLE, PTEN, RAD50, RAD51C, RAD51D, RET, SDHA, SDHB, SDHC, SMAD4, STK11, TP53 and VHL.  The report date is 08/31/2015. 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  Variants of unknown significance were not reported, as these were asked to be concealed at the time of testing.  We are in the process of requesting that they be revealed.  Any updates will be noted in the Problem List under Genetic testing.  Clinical condition Women who are carriers of a single pathogenic BRIP1 variant have an increased risk of ovarian cancer. Studies suggest the risk of ovarian cancer is approximately 8% and may be up to 15% (PMID: 53664403, 47425956, 38756433, 29518841). An individual with a BRIP1 pathogenic variant will not necessarily develop cancer in their lifetime, but the risk for cancer is increased over the general population risk.   Inheritance Hereditary predisposition to cancer due to a single  pathogenic variant in the BRIP1 gene has autosomal dominant inheritance. This means that an individual with a pathogenic variant has a 50% chance of passing the condition on to their offspring. Once such a variant is detected in an individual, it is possible to identify at-risk relatives who can pursue testing for this specific familial variant. Many cases are inherited from a parent, but some cases can occur spontaneously (i.e., an individual with a pathogenic variant has parents who do not have it). Interestingly, Stacy Chung's mutation is considered to be a [place] founder mutation, found most commonly in English/Irish/Australian ancestry.  We discussed that Stacy Chung's breast cancer is unlikely due to the BRIP1 mutation, and most likely due to her previous exposure to radiation.  When Stacy Chung's was 36, she developed Hodgkin's lymphoma and had radiation from neck to hip.  All of her cancers, excepting her melanoma, are within this exposure area.  Additionally, individuals with a pathogenic variant in BRIP1 are carriers of Fanconi anemia type J. Fanconi anemia is an autosomal recessive disorder that is characterized by bone marrow failure and variable presentation of anomalies, including short stature, abnormal skin pigmentation, abnormal thumbs, malformations of the skeletal and central nervous systems, and developmental delay (PMID: 6606301, 60109323). Risks for leukemia and early onset solid tumors are significantly elevated (PMID: 55732202, 54270623, 76283151). For there to be a risk of Fanconi anemia in offspring, both parents would each have to have a single pathogenic variant in [place]; in such a case, the risk of having an affected child is 25%.  Management The [place] (NCCN) recommends consideration of prophylactic salpingo-oophorectomy (surgical removal of the ovaries and fallopian tubes) for women with a pathogenic variant in [place] after childbearing is complete  Naval architect. Breast and Ovarian Management Based on Genetic Test Results. Version 2.2016).  This is suggested to occur between the ages of 75-50.  Women electing to defer prophylactic oophorectomy can consider screening for serum CA-125 and transvaginal ultrasound; however, data do not support such screening and it should not be a substitute for preventive surgery. The current NCCN guidelines do not recommend additional breast cancer screening for individuals with a single pathogenic BRIP1 variant beyond what is recommended for the general population. However, they caution that cancer screening should ultimately be guided by personal and family history (Artist. Breast and Ovarian Management Based on Genetic Test Results. Version 2.2016).   An individual's cancer risk and medical management are not determined by genetic test results alone. Overall cancer risk assessment incorporates additional factors, including personal medical history, family history, and any available genetic information that may result in a personalized plan for cancer prevention and surveillance.  Even though data regarding BRIP1 is preliminary, knowing if a pathogenic variant is present is advantageous. At-risk relatives can be identified, enabling pursuit of a diagnostic evaluation. Further, the available information regarding hereditary  cancer susceptibility genes is constantly evolving and more clinically relevant data regarding BRIP1 are likely to become available in the near future. Awareness of this cancer predisposition encourages patients and their providers to inform at-risk family members, to diligently follow standard screening protocols, and to be vigilant in maintaining close and regular contact with their local genetics clinic in anticipation of new information.   RISK REDUCTION: There are several things that can be offered to individuals who are carriers for BRIP1  mutations that will reduce the risk for getting cancer.   The use of oral contraceptives to lower ovarian cancer has mostly been performed in the BRCA population.  OCPs can lower the risk for ovarian cancer, and, per case control studies, does not significantly increase the risk for breast cancer in BRCA patients. Case control studies have shown that oral contraceptives can lower the risk for ovarian cancer in women with BRCA mutations. Additionally, a more recent meta-analysis, including one corhort (n=3,181) and one case control study (1,096 cases and 2,878 controls) also showed an inverse correlation between ovarian cancer and ever having used oral contraceptives (OR, 0.58; 95% CI = 0.46-0.73). Studies on oral contraceptives and breast cancer have been conflicting, with some studies suggesting that there is not an increased risk for breast cancer in BRCA mutation carriers, while others suggest that there could be a risk. That said, two meta-analysis studies have shown that there is not an increased risk for breast cancer with oral contraceptive use in BRCA1 and BRCA2 carriers.   FAMILY MEMBERS: It is important that all of Ms. Dam's relatives (both men and women) know of the presence of this gene mutation. Site-specific genetic testing can sort out who in the family is at risk and who is not.   Ms. Esch children and siblings have a 50% chance to have inherited this mutation. One son is under 71, so we would not recommend testing for him until after he is the age of 16.  For her son Heath Lark, as well as her brother and parents, se recommend they have genetic testing for this same mutation, as identifying the presence of this mutation would allow them to also take advantage of risk-reducing measures. We discussed that her brother has limited resources in regards to genetic testing.  We discussed that there are options for low cost genetic testing.  She will try to set up an appointment for  him the end of May in our clinic.   We also discussed offering testing to her parents in order to more fully understand which side of the family the BRIP1 mutation is coming from.  We discussed that it appears that maybe it could be coming from her father's side, however, there is a reportedly BRCA mutation that could also explain the ovarian cancer.    SUPPORT AND RESOURCES: If Ms. Zelaya is interested in BRIP1-specific information and support, there are a group, Facing Our Risk (www.facingourrisk.com) which some people have found useful. They provide opportunities to speak with other individuals from high-risk families. To locate genetic counselors in other cities, visit the website of the [employer] (ArtistMovie.se) and Secretary/administrator for a Social worker by zip code.  Additionally we provided information on how to participate in the [place] through [facility].  We discussed the benefit of participating in a cancer registry, including being able to participate in research, and receive updated information in the form of newsletters.  We encouraged Ms. Freeman to remain in contact with [place]  on an annual basis so we can update her personal and family histories, and let her know of advances in cancer genetics that may benefit the family. Our contact number was provided. Ms. Pint questions were answered to her satisfaction today, and she knows she is welcome to call anytime with additional questions.   Kamee Bobst P. Florene Glen, [place], [facility] Certified Genetic Counselor [email or web address] phone: [phone]

## 2015-11-29 DIAGNOSIS — I2699 Other pulmonary embolism without acute cor pulmonale: Secondary | ICD-10-CM | POA: Diagnosis not present

## 2015-11-29 DIAGNOSIS — M79662 Pain in left lower leg: Secondary | ICD-10-CM | POA: Diagnosis not present

## 2015-11-29 DIAGNOSIS — R0602 Shortness of breath: Secondary | ICD-10-CM | POA: Diagnosis not present

## 2015-11-29 DIAGNOSIS — Z853 Personal history of malignant neoplasm of breast: Secondary | ICD-10-CM | POA: Diagnosis not present

## 2015-11-29 DIAGNOSIS — K769 Liver disease, unspecified: Secondary | ICD-10-CM | POA: Diagnosis not present

## 2015-11-29 DIAGNOSIS — Z79899 Other long term (current) drug therapy: Secondary | ICD-10-CM | POA: Diagnosis not present

## 2015-11-30 ENCOUNTER — Telehealth: Payer: Self-pay | Admitting: Hematology and Oncology

## 2015-11-30 ENCOUNTER — Telehealth: Payer: Self-pay

## 2015-11-30 DIAGNOSIS — I2699 Other pulmonary embolism without acute cor pulmonale: Secondary | ICD-10-CM

## 2015-11-30 DIAGNOSIS — R0602 Shortness of breath: Secondary | ICD-10-CM | POA: Diagnosis not present

## 2015-11-30 DIAGNOSIS — M79662 Pain in left lower leg: Secondary | ICD-10-CM | POA: Diagnosis not present

## 2015-11-30 DIAGNOSIS — C50511 Malignant neoplasm of lower-outer quadrant of right female breast: Secondary | ICD-10-CM

## 2015-11-30 HISTORY — DX: Other pulmonary embolism without acute cor pulmonale: I26.99

## 2015-11-30 NOTE — Telephone Encounter (Signed)
s.w. pt and advised on next week appt.Marland KitchenMarland KitchenMarland KitchenMarland Kitchenpt ok and aware of d.t

## 2015-11-30 NOTE — Telephone Encounter (Signed)
Received [facility] fax at 10:35.  Pt called triage line for advice on 11/29/15 at 10:04pm.  Pt complaining of new onset of trouble breathing.  Pt reporting difficulty taking deep breath with pain to left side near rib cage.  Triage line informed pt to go to ED.  Called pt to follow up on this encounter.  Pt states she went to ED and is currently admitted at [facility].  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  She states a chest CT was done which she reports "showed something suspicious" on her liver.  Pt says she was started on Lovenox and the plan is for the hospitalist to start her on Xarelto today.  Pt reports currently she is breathing better and has minimal pain.  Pt denies any other new symptoms or concerns.  Pt currently on Tamoxifen and curious about what to do regarding this.  No further questions at time of call.  All information gathered discussed with Lindi Adie, MD.  Recommendation from Dr. Lindi Adie is for pt to have PET CT once discharged from facility and for him to see pt the following day to review results.  Called pt back after speaking with MD to relay all of this information to her.  Pt verbalized understanding to stop tamoxifen and plan for PET.  She is without further questions or concerns at time of call and voiced understanding to contact us with any other changes, questions, or concerns.  Order entered for PET scan and POF for pt to be seen next day.

## 2015-12-04 ENCOUNTER — Telehealth: Payer: Self-pay | Admitting: Genetic Counselor

## 2015-12-04 NOTE — Telephone Encounter (Signed)
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  Patient forwarded me a copy of the report.  Discussed that VUS are changes in genes that are seen, but that there is not enough information about the change to understand if this is disease causing change or benign.  Discussed that laboratories can have differences in experience with testing and therefore may classify variants differently.  For example, laboratories that have been around for a longer time and/or have large amounts of testing performed, may have seen particular changes more frequently and have internal data on the variant as well as informaiton in the literature.  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  Looking in the [place], larger laboratories call this a benign change.  Therefore, [facility] will most likely gain more information about this change and reclassify it as benign as well.  The NBN gene has an increased risk for breast and prostate cancer in a heterozygous state, and will cause Nijegmen Breakage Syndrome in the homozygous state.  Discussed that in clinvar this change is classified as benign or likely benign.  In both of these cases, if the patient was tested through a larger lab, these variants would not have been reported out.  The POLD1 gene is associated with colon cancer, and has a similar presentation as Lynch syndrome. This variant is not reported in Clin var, and when I asked our reps from different labs, one got back to me and they have not seen it.  Therefore this particular gene variant is a VUS by at least one additional laboratory. Discussed that over time this will be reclassified.  We will continue to follow.  The patient asked clarifying questions about her BRIP1 VUS.  Specifically why her dad needs testing.  We discussed that her father has a family history of ovarian cancer.   While we are not worried about BRIP1 causing cancer in her father, testing him would allow [place] to understand if the Brip1 mutation is coming from his side.  Additionally, he has a reported FH of a BRCA mutation in his paternal aunt.  It would be good to have him undergo genetic testing to determine if this mutation can be found in him as well.  Therefore he could be at increased risk for cancer if he did have a BRCA mutation.  We also discussed that in theory, testing him would not change his medical management and therefore his insurance may feel that it is not medically necessary.  We could have him undergo testing through [facility], which would cost him ~$60, and would do a 30 gene panel.  Similarly, her brother should undergo genetic testing to determine if his son should have testing.  While we would not test his son when he is a child, if his son is a carrier he could be at increased risk for having a child with Fanconi Anemia (BRIP1 causes Fanconi Anemia in the homozygous state) or possibly have a daughter he could pass it on to and she would be at increased risk for ovarian cancer.  He may not have insurance, so testing through [facility] could be an option as well.  Ms. Martes will call back if she has more questions.

## 2015-12-06 ENCOUNTER — Other Ambulatory Visit: Payer: Self-pay | Admitting: *Deleted

## 2015-12-06 ENCOUNTER — Encounter: Payer: Self-pay | Admitting: Hematology and Oncology

## 2015-12-06 ENCOUNTER — Ambulatory Visit (HOSPITAL_BASED_OUTPATIENT_CLINIC_OR_DEPARTMENT_OTHER): Payer: BLUE CROSS/BLUE SHIELD | Admitting: Hematology and Oncology

## 2015-12-06 ENCOUNTER — Encounter (HOSPITAL_COMMUNITY)
Admission: RE | Admit: 2015-12-06 | Discharge: 2015-12-06 | Disposition: A | Discharge status: Home / Self Care | Payer: BLUE CROSS/BLUE SHIELD | Source: Ambulatory Visit | Attending: Hematology and Oncology | Admitting: Hematology and Oncology

## 2015-12-06 VITALS — BP 82/57 | HR 87 | Temp 98.1°F | Resp 18 | Wt 171.4 lb

## 2015-12-06 DIAGNOSIS — I2699 Other pulmonary embolism without acute cor pulmonale: Secondary | ICD-10-CM

## 2015-12-06 DIAGNOSIS — K769 Liver disease, unspecified: Secondary | ICD-10-CM

## 2015-12-06 DIAGNOSIS — J90 Pleural effusion, not elsewhere classified: Secondary | ICD-10-CM

## 2015-12-06 DIAGNOSIS — C50511 Malignant neoplasm of lower-outer quadrant of right female breast: Secondary | ICD-10-CM

## 2015-12-06 DIAGNOSIS — C50919 Malignant neoplasm of unspecified site of unspecified female breast: Secondary | ICD-10-CM | POA: Diagnosis not present

## 2015-12-06 DIAGNOSIS — Z8571 Personal history of Hodgkin lymphoma: Secondary | ICD-10-CM

## 2015-12-06 LAB — GLUCOSE, CAPILLARY: GLUCOSE-CAPILLARY: 79 mg/dL (ref 65–99)

## 2015-12-06 MED ORDER — RIVAROXABAN 15 MG PO TABS: 15.0000 mg | ORAL_TABLET | Freq: Every day | ORAL | Status: DC

## 2015-12-06 MED ORDER — FLUDEOXYGLUCOSE F - 18 (FDG) INJECTION
8.4100 | Freq: Once | INTRAVENOUS | Status: AC | PRN
  Administered 2015-12-06: 8.41 via INTRAVENOUS

## 2015-12-06 MED ORDER — ENOXAPARIN SODIUM 80 MG/0.8ML ~~LOC~~ SOLN: 80.0000 mg | Freq: Two times a day (BID) | SUBCUTANEOUS | Status: DC

## 2015-12-06 MED ORDER — RIVAROXABAN 20 MG PO TABS: 20.0000 mg | ORAL_TABLET | Freq: Every day | ORAL | Status: DC

## 2015-12-06 NOTE — Progress Notes (Signed)
Patient Care Team: [name], MD as PCP - General (Internal Medicine) Arvella Nigh, MD (Obstetrics and Gynecology) [name], NP as Nurse Practitioner (Obstetrics and Gynecology) [name], MD as Consulting Physician (Sports Medicine) [name], MD as Consulting Physician (Hematology and Oncology) [name], MD as Consulting Physician (General Surgery) [name], NP as Nurse Practitioner (Hematology and Oncology)  DIAGNOSIS: Breast cancer of lower-outer quadrant of right female breast [facility])   Staging form: Breast, AJCC 7th Edition     Clinical stage from 07/25/2015: Stage IA (T1b, N0, M0) - Unsigned     Pathologic stage from 08/28/2015: Stage IA (T1a, N0, cM0) - Unsigned   SUMMARY OF ONCOLOGIC HISTORY:   Hodgkin's lymphoma ([place])   06/21/1979 Initial Diagnosis Stage III Hodgkin's disease with cervical, retroperitoneal lymph nodes, treated with ABVD-MOPP (alternating) followed by chest and abdominal radiation at [facility] with complete response   09/23/2001 Pathology Results Melanoma stage I on the back treated with resection   09/27/2002 Pathology Results Thyroid cancer treated with thyroidectomy    Breast cancer of lower-outer quadrant of right female breast ([place])   07/18/2015 Breast MRI Rt breast mass 0.9 x 0.8 x 0.8 cm, with clumped NME 4 cm, 2 sites of Bx proven flat epithelial atypia   07/25/2015 Initial Diagnosis Rt Breast Biopsy: IDC with calcs and DCIS and ALH, Er 100%, PR 100%, Ki 67 5%, Her 2 Neg Ratio 1.35   07/25/2015 Clinical Stage Stage IA: T1b N0   07/25/2015 Oncotype testing RS 6 (5% ROR)   07/26/2015 Procedure Genetic testing revealed BRIP-1   08/28/2015 Surgery Bilateral mastectomies with reconstruction in [place] at [place] for restorative surgery (Dr.Delacroce): 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 Plans to transition to AI following oopherectomy  (June 2017)   10/31/2015 Survivorship Care plan completed and mailed to patient in lieu of in person visit at her request    CHIEF COMPLIANT: Recent hospitalization for pulmonary embolism, recent PET/CT scan  INTERVAL HISTORY: Stacy Chung is a 45 year old with above-mentioned history of Hodgkin lymphoma as well as right breast cancer. She underwent bilateral mastectomies and reconstruction in [place]. She had high-grade DCIS. She was started on tamoxifen therapy but she was admitted the hospital recently with blood clots in the lung and tamoxifen has been discontinued. She had a PET CT scan to evaluate the liver lesion and she is here today to discuss his Results. She Is Planning to Go to [place].  REVIEW OF SYSTEMS:   Constitutional: Denies fevers, chills or abnormal weight loss Eyes: Denies blurriness of vision Ears, nose, mouth, throat, and face: Denies mucositis or sore throat Respiratory: Recent pulmonary embolism Cardiovascular: Denies palpitation, chest discomfort Gastrointestinal:  Denies nausea, heartburn or change in bowel habits Skin: Denies abnormal skin rashes Lymphatics: Denies new lymphadenopathy or easy bruising Neurological:Denies numbness, tingling or new weaknesses Behavioral/Psych: Mood is stable, no new changes  Extremities: No lower extremity edema  All other systems were reviewed with the patient and are negative.  I have reviewed the past medical history, past surgical history, social history and family history with the patient and they are unchanged from previous note.  ALLERGIES:  is allergic to other; macrodantin; pertussis vaccines; and tape.  MEDICATIONS:  Current Outpatient Prescriptions  Medication Sig Dispense Refill  . cetirizine (ZYRTEC) 5 MG tablet Take 1 tablet (5 mg total) by mouth daily. 30 tablet  3  . Cholecalciferol (VITAMIN D3) 10000 UNITS capsule Take 10,000 Units by mouth daily. Only 5 days a week     . enoxaparin (LOVENOX) 80  MG/0.8ML injection Inject 0.8 mLs (80 mg total) into the skin every 12 (twelve) hours. 8 Syringe 0  . levocetirizine (XYZAL) 5 MG tablet Take 1 tablet (5 mg total) by mouth every evening. 30 tablet 3  . levothyroxine (SYNTHROID, LEVOTHROID) 75 MCG tablet Take 75 mcg by mouth daily.      . Rivaroxaban (XARELTO) 15 MG TABS tablet Take 1 tablet (15 mg total) by mouth daily with supper. 14 tablet 0  . rivaroxaban (XARELTO) 20 MG TABS tablet Take 1 tablet (20 mg total) by mouth daily with supper. 30 tablet 6  . ZOLMitriptan (ZOMIG) 2.5 MG tablet Take 2.5 mg by mouth as needed.       No current facility-administered medications for this visit.    PHYSICAL EXAMINATION: ECOG PERFORMANCE STATUS: 1 - Symptomatic but completely ambulatory  Filed Vitals:   12/06/15 1527  BP: 82/57  Pulse: 87  Temp: 98.1 F (36.7 C)  Resp: 18   Filed Weights   12/06/15 1527  Weight: 171 lb 6.4 oz (77.747 kg)    GENERAL:alert, no distress and comfortable SKIN: skin color, texture, turgor are normal, no rashes or significant lesions EYES: normal, Conjunctiva are pink and non-injected, sclera clear OROPHARYNX:no exudate, no erythema and lips, buccal mucosa, and tongue normal  NECK: supple, thyroid normal size, non-tender, without nodularity LYMPH:  no palpable lymphadenopathy in the cervical, axillary or inguinal LUNGS: clear to auscultation and percussion with normal breathing effort HEART: regular rate & rhythm and no murmurs and no lower extremity edema ABDOMEN:abdomen soft, non-tender and normal bowel sounds MUSCULOSKELETAL:no cyanosis of digits and no clubbing  NEURO: alert & oriented x 3 with fluent speech, no focal motor/sensory deficits EXTREMITIES: No lower extremity edema  LABORATORY DATA:  I have reviewed the data as listed   Chemistry      Component Value Date/Time   NA 134* 09/06/2013 1551   K 4.1 09/06/2013 1551   CL 103 09/06/2013 1551   CO2 22 09/06/2013 1551   BUN 12 09/06/2013 1551     CREATININE 0.77 09/06/2013 1551      Component Value Date/Time   CALCIUM 9.3 09/06/2013 1551       No results found for: WBC, HGB, HCT, MCV, PLT, NEUTROABS   ASSESSMENT & PLAN:  Breast cancer of lower-outer quadrant of right female breast (HCC) Rt Breast mass 07/18/15: MRI breast: 0.9 x 0.8 x 0.8 cm, with clumped NME 4 cm, 2 sites of Bx proven flat epithelial atypia, T1bN0 (stage 1A) Rt Breast Biopsy 07/25/15: IDC with calcs and DCIS and ALH, Er 100%, PR 100%, Ki 67 5%, Her 2 Neg Ratio 1.35 Bilateral mastectomies 08/28/2015: In [place], 1.5 cm residual high-grade DCIS Tis N0 stage 0 Final pathologic staging: T1a N0 stage IA DX recurrence score 6, 5% risk of recurrence with tamoxifen  Current treatment: Tamoxifen 20 mg daily stopped due to pulmonary embolism.  Once she has oophorectomy, we will start anastrozole.  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 (Patient to have oophorectomy in [place] and of June) Pulmonary embolism: Most likely due to tamoxifen therapy. We stopped tamoxifen. Patient has pleural effusion most likely related to pulmonary embolism. I do not believe there is any need to do a thoracentesis. I sent a prescription for Xarelto.  PET CT scan: 12/06/2015: Right  lobe of the liver lesion hypermetabolic on PET scan although it is relatively unchanged from July 2015. I will contact interventional radiology to obtain a liver biopsy.  Anticoagulation perioperative instructions: Patient has scheduled for oophorectomy on July 20. I instructed her to stop Xarelto on July 17 and start Lovenox 80 mg subcutaneous twice a day until July 19. She will resume Xarelto on July 21  General cancer surveillance: 1. Recommended colonoscopy every 5 years because of abdominal radiation 2. Patient plans to undergo hysterectomy with bilateral salpingo-oophorectomy (in June 2017). 3. No imaging studies required for the breast because she had bilateral  mastectomies.  Return to clinic in 2 months for follow-up  No orders of the defined types were placed in this encounter.   The patient has a good understanding of the overall plan. she agrees with it. she will call with any problems that may develop before the next visit here.   Rulon Eisenmenger, MD 12/06/2015

## 2015-12-06 NOTE — Assessment & Plan Note (Signed)
Rt Breast mass 07/18/15: MRI breast: 0.9 x 0.8 x 0.8 cm, with clumped NME 4 cm, 2 sites of Bx proven flat epithelial atypia, T1bN0 (stage 1A) Rt Breast Biopsy 07/25/15: IDC with calcs and DCIS and ALH, Er 100%, PR 100%, Ki 67 5%, Her 2 Neg Ratio 1.35 Bilateral mastectomies 08/28/2015: In [place], 1.5 cm residual high-grade DCIS Tis N0 stage 0 Final pathologic staging: T1a N0 stage IA DX recurrence score 6, 5% risk of recurrence with tamoxifen  Current treatment: Tamoxifen 20 mg daily but once she has oophorectomy, we will switch her to anastrozole.  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  General cancer surveillance: 1. Recommended colonoscopy every 5 years because of abdominal radiation 2. Patient plans to undergo hysterectomy with bilateral salpingo-oophorectomy (in June 2017). 3. No imaging studies required for the breast because she had bilateral mastectomies.  Return to clinic in 3 months for follow-up

## 2015-12-11 ENCOUNTER — Other Ambulatory Visit: Payer: Self-pay

## 2015-12-11 DIAGNOSIS — C50511 Malignant neoplasm of lower-outer quadrant of right female breast: Secondary | ICD-10-CM

## 2015-12-19 ENCOUNTER — Other Ambulatory Visit: Payer: Self-pay | Admitting: Radiology

## 2015-12-20 ENCOUNTER — Ambulatory Visit (HOSPITAL_COMMUNITY)
Admission: RE | Admit: 2015-12-20 | Discharge: 2015-12-20 | Disposition: A | Discharge status: Home / Self Care | Payer: BLUE CROSS/BLUE SHIELD | Source: Ambulatory Visit | Attending: Hematology and Oncology | Admitting: Hematology and Oncology

## 2015-12-20 ENCOUNTER — Encounter (HOSPITAL_COMMUNITY): Payer: Self-pay

## 2015-12-20 DIAGNOSIS — K769 Liver disease, unspecified: Secondary | ICD-10-CM | POA: Diagnosis not present

## 2015-12-20 DIAGNOSIS — C50511 Malignant neoplasm of lower-outer quadrant of right female breast: Secondary | ICD-10-CM

## 2015-12-20 DIAGNOSIS — Z85850 Personal history of malignant neoplasm of thyroid: Secondary | ICD-10-CM | POA: Diagnosis not present

## 2015-12-20 DIAGNOSIS — K7689 Other specified diseases of liver: Secondary | ICD-10-CM | POA: Diagnosis not present

## 2015-12-20 DIAGNOSIS — Z86718 Personal history of other venous thrombosis and embolism: Secondary | ICD-10-CM | POA: Insufficient documentation

## 2015-12-20 DIAGNOSIS — Z86711 Personal history of pulmonary embolism: Secondary | ICD-10-CM | POA: Insufficient documentation

## 2015-12-20 DIAGNOSIS — E079 Disorder of thyroid, unspecified: Secondary | ICD-10-CM | POA: Insufficient documentation

## 2015-12-20 DIAGNOSIS — Z8571 Personal history of Hodgkin lymphoma: Secondary | ICD-10-CM | POA: Diagnosis not present

## 2015-12-20 DIAGNOSIS — Z888 Allergy status to other drugs, medicaments and biological substances status: Secondary | ICD-10-CM | POA: Insufficient documentation

## 2015-12-20 DIAGNOSIS — Z79899 Other long term (current) drug therapy: Secondary | ICD-10-CM | POA: Diagnosis not present

## 2015-12-20 DIAGNOSIS — Z853 Personal history of malignant neoplasm of breast: Secondary | ICD-10-CM | POA: Insufficient documentation

## 2015-12-20 DIAGNOSIS — Z85820 Personal history of malignant melanoma of skin: Secondary | ICD-10-CM | POA: Insufficient documentation

## 2015-12-20 DIAGNOSIS — Z7901 Long term (current) use of anticoagulants: Secondary | ICD-10-CM | POA: Insufficient documentation

## 2015-12-20 HISTORY — DX: Other pulmonary embolism without acute cor pulmonale: I26.99

## 2015-12-20 LAB — PROTIME-INR
INR: 1.1 (ref 0.00–1.49)
PROTHROMBIN TIME: 14.3 s (ref 11.6–15.2)

## 2015-12-20 LAB — COMPREHENSIVE METABOLIC PANEL
ALK PHOS: 65 U/L (ref 38–126)
ALT: 41 U/L (ref 14–54)
AST: 30 U/L (ref 15–41)
Albumin: 4.1 g/dL (ref 3.5–5.0)
Anion gap: 7 (ref 5–15)
BILIRUBIN TOTAL: 0.5 mg/dL (ref 0.3–1.2)
BUN: 14 mg/dL (ref 6–20)
CALCIUM: 8.8 mg/dL — AB (ref 8.9–10.3)
CO2: 21 mmol/L — ABNORMAL LOW (ref 22–32)
CREATININE: 0.72 mg/dL (ref 0.44–1.00)
Chloride: 109 mmol/L (ref 101–111)
Glucose, Bld: 108 mg/dL — ABNORMAL HIGH (ref 65–99)
Potassium: 3.7 mmol/L (ref 3.5–5.1)
Sodium: 137 mmol/L (ref 135–145)
Total Protein: 7.6 g/dL (ref 6.5–8.1)

## 2015-12-20 LAB — CBC WITH DIFFERENTIAL/PLATELET
Basophils Absolute: 0.1 10*3/uL (ref 0.0–0.1)
Basophils Relative: 1 %
EOS PCT: 2 %
Eosinophils Absolute: 0.1 10*3/uL (ref 0.0–0.7)
HCT: 37.3 % (ref 36.0–46.0)
HEMOGLOBIN: 12.8 g/dL (ref 12.0–15.0)
LYMPHS ABS: 2.2 10*3/uL (ref 0.7–4.0)
LYMPHS PCT: 31 %
MCH: 30.6 pg (ref 26.0–34.0)
MCHC: 34.3 g/dL (ref 30.0–36.0)
MCV: 89.2 fL (ref 78.0–100.0)
Monocytes Absolute: 0.7 10*3/uL (ref 0.1–1.0)
Monocytes Relative: 11 %
Neutro Abs: 3.8 10*3/uL (ref 1.7–7.7)
Neutrophils Relative %: 55 %
PLATELETS: 477 10*3/uL — AB (ref 150–400)
RBC: 4.18 MIL/uL (ref 3.87–5.11)
RDW: 14.1 % (ref 11.5–15.5)
WBC: 6.9 10*3/uL (ref 4.0–10.5)

## 2015-12-20 MED ORDER — FLUMAZENIL 0.5 MG/5ML IV SOLN
INTRAVENOUS | Status: DC
Start: 2015-12-20 — End: 2015-12-20
  Filled 2015-12-20: qty 5

## 2015-12-20 MED ORDER — FENTANYL CITRATE (PF) 100 MCG/2ML IJ SOLN
INTRAMUSCULAR | Status: AC
  Filled 2015-12-20: qty 2

## 2015-12-20 MED ORDER — NALOXONE HCL 0.4 MG/ML IJ SOLN
INTRAMUSCULAR | Status: AC
  Filled 2015-12-20: qty 1

## 2015-12-20 MED ORDER — SODIUM CHLORIDE 0.9 % IV SOLN
INTRAVENOUS | Status: DC
  Administered 2015-12-20: 11:00:00 via INTRAVENOUS

## 2015-12-20 MED ORDER — HYDROCODONE-ACETAMINOPHEN 5-325 MG PO TABS
1.0000 | ORAL_TABLET | ORAL | Status: DC | PRN
  Filled 2015-12-20: qty 2

## 2015-12-20 MED ORDER — FENTANYL CITRATE (PF) 100 MCG/2ML IJ SOLN
INTRAMUSCULAR | Status: AC | PRN
  Administered 2015-12-20: 25 ug via INTRAVENOUS
  Administered 2015-12-20: 50 ug via INTRAVENOUS
  Administered 2015-12-20: 25 ug via INTRAVENOUS

## 2015-12-20 MED ORDER — MIDAZOLAM HCL 2 MG/2ML IJ SOLN
INTRAMUSCULAR | Status: AC | PRN
  Administered 2015-12-20 (×2): 0.5 mg via INTRAVENOUS
  Administered 2015-12-20 (×2): 1 mg via INTRAVENOUS

## 2015-12-20 MED ORDER — MIDAZOLAM HCL 2 MG/2ML IJ SOLN
INTRAMUSCULAR | Status: DC
Start: 2015-12-20 — End: 2015-12-21
  Filled 2015-12-20: qty 4

## 2015-12-20 NOTE — Sedation Documentation (Signed)
Patient denies pain and is resting comfortably.  

## 2015-12-20 NOTE — Discharge Instructions (Signed)
Liver Biopsy, Care After °Refer to this sheet in the next few weeks. These instructions provide you with information on caring for yourself after your procedure. Your health care provider may also give you more specific instructions. Your treatment has been planned according to current medical practices, but problems sometimes occur. Call your health care provider if you have any problems or questions after your procedure. °WHAT TO EXPECT AFTER THE PROCEDURE °After your procedure, it is typical to have the following: °· A small amount of discomfort in the area where the biopsy was done and in the right shoulder or shoulder blade. °· A small amount of bruising around the area where the biopsy was done and on the skin over the liver. °· Sleepiness and fatigue for the rest of the day. °HOME CARE INSTRUCTIONS  °· Rest at home for 1-2 days or as directed by your health care provider. °· Have a friend or family member stay with you for at least 24 hours. °· Because of the medicines used during the procedure, you should not do the following things in the first 24 hours: °¨ Drive. °¨ Use machinery. °¨ Be responsible for the care of other people. °¨ Sign legal documents. °¨ Take a bath or shower. °· There are many different ways to close and cover an incision, including stitches, skin glue, and adhesive strips. Follow your health care provider's instructions on: °¨ Incision care. °¨ Bandage (dressing) changes and removal. °¨ Incision closure removal. °· Do not drink alcohol in the first week. °· Do not lift more than 5 pounds or play contact sports for 2 weeks after this test. °· Take medicines only as directed by your health care provider. Do not take medicine containing aspirin or non-steroidal anti-inflammatory medicines such as ibuprofen for 1 week after this test. °· It is your responsibility to get your test results. °SEEK MEDICAL CARE IF:  °· You have increased bleeding from an incision that results in more than a  small spot of blood. °· You have redness, swelling, or increasing pain in any incisions. °· You notice a discharge or a bad smell coming from any of your incisions. °· You have a fever or chills. °SEEK IMMEDIATE MEDICAL CARE IF:  °· You develop swelling, bloating, or pain in your abdomen. °· You become dizzy or faint. °· You develop a rash. °· You are nauseous or vomit. °· You have difficulty breathing, feel short of breath, or feel faint. °· You develop chest pain. °· You have problems with your speech or vision. °· You have trouble balancing or moving your arms or legs. °  °This information is not intended to replace advice given to you by your health care provider. Make sure you discuss any questions you have with your health care provider. °  °Document Released: 01/17/2005 Document Revised: 07/21/2014 Document Reviewed: 08/26/2013 °Elsevier Interactive Patient Education ©2016 Elsevier Inc[employer] ° °Moderate Conscious Sedation, Adult, Care After °Refer to this sheet in the next few weeks. These instructions provide you with information on caring for yourself after your procedure. Your health care provider may also give you more specific instructions. Your treatment has been planned according to current medical practices, but problems sometimes occur. Call your health care provider if you have any problems or questions after your procedure. °WHAT TO EXPECT AFTER THE PROCEDURE  °After your procedure: °· You may feel sleepy, clumsy, and have poor balance for several hours. °· Vomiting may occur if you eat too soon after the procedure. °  HOME CARE INSTRUCTIONS °· Do not participate in any activities where you could become injured for at least 24 hours. Do not: °¨ Drive. °¨ Swim. °¨ Ride a bicycle. °¨ Operate heavy machinery. °¨ Cook. °¨ Use power tools. °¨ Climb ladders. °¨ Work from a high place. °· Do not make important decisions or sign legal documents until you are improved. °· If you vomit, drink water, juice, or soup  when you can drink without vomiting. Make sure you have little or no nausea before eating solid foods. °· Only take over-the-counter or prescription medicines for pain, discomfort, or fever as directed by your health care provider. °· Make sure you and your family fully understand everything about the medicines given to you, including what side effects may occur. °· You should not drink alcohol, take sleeping pills, or take medicines that cause drowsiness for at least 24 hours. °· If you smoke, do not smoke without supervision. °· If you are feeling better, you may resume normal activities 24 hours after you were sedated. °· Keep all appointments with your health care provider. °SEEK MEDICAL CARE IF: °· Your skin is pale or bluish in color. °· You continue to feel nauseous or vomit. °· Your pain is getting worse and is not helped by medicine. °· You have bleeding or swelling. °· You are still sleepy or feeling clumsy after 24 hours. °SEEK IMMEDIATE MEDICAL CARE IF: °· You develop a rash. °· You have difficulty breathing. °· You develop any type of allergic problem. °· You have a fever. °MAKE SURE YOU: °· Understand these instructions. °· Will watch your condition. °· Will get help right away if you are not doing well or get worse. °  °This information is not intended to replace advice given to you by your health care provider. Make sure you discuss any questions you have with your health care provider. °  °Document Released: 04/20/2013 Document Revised: 07/21/2014 Document Reviewed: 04/20/2013 °Elsevier Interactive Patient Education ©2016 Elsevier Inc. ° ° °

## 2015-12-20 NOTE — Procedures (Signed)
Interventional Radiology Procedure Note  Procedure:  US guided core biopsy of left lobe liver lesion  Complications:  None  Estimated Blood Loss: < 10 mL  1.5 cm hyperechoic liver lesion localized with [place].  18 G core biopsy x 3 via 17 G needle.  No immediate complications.  Venetia Night. Kathlene Cote, M.D Pager:  [phone]

## 2015-12-20 NOTE — Consult Note (Signed)
Chief Complaint: Patient was seen in consultation today for image guided liver lesion biopsy  Referring Physician(s): [place]  Supervising Physician: Aletta Edouard  Patient Status:  Out-pt  History of Present Illness: Stacy Chung is a 45 y.o. female with prior history of Hodgkin's disease in 1980, [place] 2003, thyroid cancer 2004, and most recently diagnosed right breast cancer in January 2017. Patient also has history of lower extremity DVT and bilateral PE last month, currently on Xarelto and Lovenox. Follow-up PET scan on 12/06/15 revealed a medium sized focus of intense uptake within the medial segment of the left lobe of the liver concerning for liver metastasis or primary liver neoplasm. She presents today for image guided liver lesion biopsy for further evaluation.  Past Medical History  Diagnosis Date  . Increased frequency of headaches   . Migraines   . Thyroid disease   . Anxiety   . Hx: UTI (urinary tract infection)   . Allergy   . Carcinoma of thyroid ([place])   . History of chicken pox   . Hodgkin's disease   . PONV (postoperative nausea and vomiting)   . Depression   . GERD (gastroesophageal reflux disease)   . Left breast mass   . Family history of breast cancer   . Family history of ovarian cancer   . Family history of prostate cancer   . Family history of colon cancer   . Breast cancer ([place])   . Skin cancer 2003    Melanoma  . 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 Cancer ([place]) 1986    Hodgkins lymphoma  . Pulmonary embolism ([place]) 11/30/2015    Past Surgical History  Procedure Laterality Date  . Ureteroplasty  1977    malformed ureter  . Thoracotomy  1991    collasped lung, spontaneous  . Thyroidectomy  2004    nodules, carcinoma  . Laparoscopic splenectomy  1987    Hodgkins Disease  . Foot tendon surgery Left     Allergies: Other; Macrodantin; Pertussis vaccines; and Tape  Medications: Prior to Admission  medications   Medication Sig Start Date End Date Taking? Authorizing Provider  Cholecalciferol (VITAMIN D3) 10000 UNITS capsule Take 10,000 Units by mouth daily. Only 5 days a week    Yes Historical Provider, MD  enoxaparin (LOVENOX) 80 MG/0.8ML injection Inject 0.8 mLs (80 mg total) into the skin every 12 (twelve) hours. 12/06/15  Yes [name], MD  fexofenadine-pseudoephedrine (ALLEGRA-D 24) 180-240 MG 24 hr tablet Take 1 tablet by mouth daily.   Yes Historical Provider, MD  levocetirizine (XYZAL) 5 MG tablet Take 1 tablet (5 mg total) by mouth every evening. 10/15/15  Yes [name], MD  levothyroxine (SYNTHROID, LEVOTHROID) 75 MCG tablet Take 75 mcg by mouth daily.     Yes Historical Provider, MD  Rivaroxaban (XARELTO) 15 MG TABS tablet Take 1 tablet (15 mg total) by mouth daily with supper. 12/06/15  Yes [name], MD  cetirizine (ZYRTEC) 5 MG tablet Take 1 tablet (5 mg total) by mouth daily. 10/15/15   [name], MD  rivaroxaban (XARELTO) 20 MG TABS tablet Take 1 tablet (20 mg total) by mouth daily with supper. 12/06/15   [name], MD  ZOLMitriptan (ZOMIG) 2.5 MG tablet Take 2.5 mg by mouth as needed.      Historical Provider, MD     Family History  Problem Relation Age of Onset  . Alcohol abuse Maternal Aunt   . Prostate cancer  Maternal Uncle 55  . Colon cancer Maternal Grandmother     dx in her 60s  . Prostate cancer Maternal Grandfather     dx in his 71s  . Cancer Other     FH of Breast Cancer, Ovarian/Uterine Cancer  . Heart disease Maternal Uncle     55s  . Breast cancer Other     MGFs sister - reportedly BRCA+  . Ovarian cancer Other     PGFs mother  . Ovarian cancer Other     PGFs maternal aunt  . Ovarian cancer Other     PGFs maternal grandmohter  . Breast cancer Other     MGMs sister  . Breast cancer Other     MGFs mother    Social History   Social History  . Marital Status: Married    Spouse Name: N/A  . Number of Children: 2  . Years of Education:  16   Occupational History  . Chief Strategy Officer    Social History Main Topics  . Smoking status: Never Smoker   . Smokeless tobacco: None  . Alcohol Use: Yes     Comment: social  . Drug Use: No  . Sexual Activity: Yes    Birth Control/ Protection: IUD   Other Topics Concern  . None   Social History Narrative   Regular exercise-no      Review of Systems  patient currently denies fever, headache, chest pain, dyspnea, significant abdominal pain, back pain, nausea, vomiting or abnormal bleeding. She does have occasional cough.  Vital Signs: BP 105/70 mmHg  Pulse 86  Temp(Src) 98.2 F (36.8 C) (Oral)  Resp 16  Wt 171 lb 12.8 oz (77.928 kg)  SpO2 100%  Physical Exam patient awake, alert. Chest with clear to auscultation bilaterally. Heart with regular rate and rhythm. Abdomen soft, positive bowel sounds, mild epigastric tenderness to palpation. Lower extremities no significant edema.  Mallampati Score:     Imaging: Nm Pet Image Initial (pi) Skull Base To Thigh  12/06/2015  CLINICAL DATA:  Subsequent treatment strategy for breast cancer. EXAM: NUCLEAR MEDICINE PET SKULL BASE TO THIGH TECHNIQUE: 8.4 mCi F-18 FDG was injected intravenously. Full-ring PET imaging was performed from the skull base to thigh after the radiotracer. CT data was obtained and used for attenuation correction and anatomic localization. FASTING BLOOD GLUCOSE:  Value: 79 mg/dl COMPARISON:  11/30/2015 FINDINGS: NECK There is symmetric increased uptake within both tonsillar regions. There is also increased uptake within the posterior nasopharynx. No hypermetabolic cervical lymph nodes are identified. CHEST No hypermetabolic supraclavicular or axillary lymph nodes. Status post bilateral mastectomy and breast reconstruction. The heart size is normal. There is no pericardial effusion. No enlarged or hypermetabolic mediastinal or hilar lymph nodes identified. A moderate left pleural effusion is identified. No  suspicious nodules identified. ABDOMEN/PELVIS Within the medial segment of left lobe of liver there is a hypermetabolic lesion measuring 1.6 cm. This has an SUV max equal to 8.98 and is suspicious. In retrospect there was a low-attenuation structure in this area measuring the same. Splenule is identified within the left upper quadrant of the abdomen. The adrenal glands are normal. No hypermetabolic lymph nodes within the upper abdomen. SKELETON No focal hypermetabolic activity to suggest skeletal metastasis. IMPRESSION: 1. There is a medium size focus of intense uptake within the medial segment of left lobe of liver. Despite there being a similar size lesion in this location on study from 01/21/2014, this remains worrisome for either liver metastasis or primary  liver neoplasm. Findings may represent a collision tumor with previous benign liver lesion. Correlation with tissue sampling is recommended. 2. No additional foci of increased uptake identified. 3. Moderate left pleural effusion. In a patient who has a history of breast cancer consider further evaluation with diagnostic thoracentesis to exclude the possibility of malignant effusion. Electronically Signed   By: Kerby Moors M.D.   On: 12/06/2015 14:37    Labs:  CBC:  Recent Labs  12/20/15 1120  WBC 6.9  HGB 12.8  HCT 37.3  PLT 477*    COAGS:  Recent Labs  12/20/15 1120  INR 1.10    BMP: No results for input(s): NA, K, CL, CO2, GLUCOSE, BUN, CALCIUM, CREATININE, GFRNONAA, GFRAA in the last 8760 hours.  Invalid input(s): CMP  LIVER FUNCTION TESTS: No results for input(s): BILITOT, AST, ALT, ALKPHOS, PROT, ALBUMIN in the last 8760 hours.  TUMOR MARKERS: No results for input(s): AFPTM, CEA, CA199, CHROMGRNA in the last 8760 hours.  Assessment and Plan: 45 y.o. female with prior history of Hodgkin's disease in 1980, [place] 2003, thyroid cancer 2004, and most recently diagnosed right breast cancer in January 2017. Patient also  has history of lower extremity DVT and bilateral PE last month, currently on Xarelto and Lovenox. Follow-up PET scan on 12/06/15 revealed a medium sized focus of intense uptake within the medial segment of the left lobe of the liver concerning for liver metastasis or primary liver neoplasm. She presents today for image guided liver lesion biopsy for further evaluation.Risks and benefits discussed with the patient including, but not limited to bleeding, infection, damage to adjacent structures or low yield requiring additional tests. All of the patient's questions were answered, patient is agreeable to proceed. Consent signed and in chart.     Thank you for this interesting consult.  I greatly enjoyed meeting Stacy Chung and look forward to participating in their care.  A copy of this report was sent to the requesting provider on this date.  Electronically Signed: D. Rowe Robert 12/20/2015, 11:57 AM   I spent a total of  25 minutes   in face to face in clinical consultation, greater than 50% of which was counseling/coordinating care for image guided liver lesion biopsy

## 2015-12-21 ENCOUNTER — Encounter: Payer: Self-pay | Admitting: Hematology and Oncology

## 2016-01-03 ENCOUNTER — Other Ambulatory Visit: Payer: Self-pay | Admitting: Hematology and Oncology

## 2016-01-03 ENCOUNTER — Other Ambulatory Visit: Payer: Self-pay

## 2016-01-03 DIAGNOSIS — K769 Liver disease, unspecified: Secondary | ICD-10-CM | POA: Insufficient documentation

## 2016-01-14 ENCOUNTER — Ambulatory Visit
Admission: RE | Admit: 2016-01-14 | Discharge: 2016-01-14 | Disposition: A | Discharge status: Home / Self Care | Payer: BLUE CROSS/BLUE SHIELD | Source: Ambulatory Visit | Attending: Hematology and Oncology | Admitting: Hematology and Oncology

## 2016-01-14 DIAGNOSIS — K769 Liver disease, unspecified: Secondary | ICD-10-CM

## 2016-01-14 DIAGNOSIS — K7689 Other specified diseases of liver: Secondary | ICD-10-CM | POA: Diagnosis not present

## 2016-01-14 MED ORDER — GADOXETATE DISODIUM 0.25 MMOL/ML IV SOLN
6.0000 mL | Freq: Once | INTRAVENOUS | Status: AC | PRN
  Administered 2016-01-14: 6 mL via INTRAVENOUS

## 2016-01-16 ENCOUNTER — Telehealth: Payer: Self-pay | Admitting: Hematology and Oncology

## 2016-01-16 NOTE — Telephone Encounter (Signed)
Received forwarded message from Dr. [name] via Dr. Lindi Adie requesting that Dr. Garwin Brothers be added as patient's ob-gyn and receive updates for patient. Per message somehow Dr. Ophelia Charter is listed and Dr. Garwin Brothers has received no updates on patient. Spoke with patient today and confirmed Dr. Garwin Brothers as her ob-gyn. Per patient Dr. Garwin Brothers should be added and Dr. Radene Knee should be removed. Added Dr. Garwin Brothers to care team members and removed Dr. Ophelia Charter - patient aware. Also sent confirming to Dr. Garwin Brothers and Dr. Lindi Adie via staff message.

## 2016-01-21 DIAGNOSIS — Z111 Encounter for screening for respiratory tuberculosis: Secondary | ICD-10-CM | POA: Diagnosis not present

## 2016-01-24 ENCOUNTER — Ambulatory Visit: Payer: BLUE CROSS/BLUE SHIELD | Admitting: Hematology and Oncology

## 2016-01-29 ENCOUNTER — Ambulatory Visit (HOSPITAL_BASED_OUTPATIENT_CLINIC_OR_DEPARTMENT_OTHER): Payer: BLUE CROSS/BLUE SHIELD | Admitting: Hematology and Oncology

## 2016-01-29 ENCOUNTER — Encounter: Payer: Self-pay | Admitting: Hematology and Oncology

## 2016-01-29 VITALS — BP 132/73 | HR 87 | Temp 98.3°F | Resp 18 | Wt 178.1 lb

## 2016-01-29 DIAGNOSIS — L709 Acne, unspecified: Secondary | ICD-10-CM

## 2016-01-29 DIAGNOSIS — N920 Excessive and frequent menstruation with regular cycle: Secondary | ICD-10-CM | POA: Diagnosis not present

## 2016-01-29 DIAGNOSIS — Z853 Personal history of malignant neoplasm of breast: Secondary | ICD-10-CM | POA: Diagnosis not present

## 2016-01-29 DIAGNOSIS — I2699 Other pulmonary embolism without acute cor pulmonale: Secondary | ICD-10-CM

## 2016-01-29 MED ORDER — CLINDAMYCIN PHOSPHATE 1 % EX GEL: Freq: Two times a day (BID) | CUTANEOUS | Status: DC

## 2016-01-29 NOTE — Progress Notes (Signed)
Patient Care Team: [name], MD as PCP - General (Internal Medicine) [name], NP as Nurse Practitioner (Obstetrics and Gynecology) [name], MD as Consulting Physician (Sports Medicine) [name], MD as Consulting Physician (Hematology and Oncology) [name], MD as Consulting Physician (General Surgery) [name], NP as Nurse Practitioner (Hematology and Oncology) [name], MD as Consulting Physician (Obstetrics and Gynecology)  DIAGNOSIS: Breast cancer of lower-outer quadrant of right female breast [facility])   Staging form: Breast, AJCC 7th Edition     Clinical stage from 07/25/2015: Stage IA (T1b, N0, M0) - Unsigned     Pathologic stage from 08/28/2015: Stage IA (T1a, N0, cM0) - Unsigned   SUMMARY OF ONCOLOGIC HISTORY:   Hodgkin's lymphoma ([place])   06/21/1979 Initial Diagnosis Stage III Hodgkin's disease with cervical, retroperitoneal lymph nodes, treated with ABVD-MOPP (alternating) followed by chest and abdominal radiation at [facility] with complete response   09/23/2001 Pathology Results Melanoma stage I on the back treated with resection   09/27/2002 Pathology Results Thyroid cancer treated with thyroidectomy    Breast cancer of lower-outer quadrant of right female breast ([place])   07/18/2015 Breast MRI Rt breast mass 0.9 x 0.8 x 0.8 cm, with clumped NME 4 cm, 2 sites of Bx proven flat epithelial atypia   07/25/2015 Initial Diagnosis Rt Breast Biopsy: IDC with calcs and DCIS and ALH, Er 100%, PR 100%, Ki 67 5%, Her 2 Neg Ratio 1.35   07/25/2015 Clinical Stage Stage IA: T1b N0   07/25/2015 Oncotype testing RS 6 (5% ROR)   07/26/2015 Procedure 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 Stopped  May 2017 for pulmonary embolism   10/31/2015 Survivorship Care plan completed and mailed to patient in lieu of in person visit at her request   11/20/2015 - 11/23/2015 Hospital Admission Hospitalization for pulmonary embolism    CHIEF COMPLIANT: Multiple problems including postponement of surgery for pulmonary embolism, recent heavy menstrual bleeding, acne, abdominal fullness  INTERVAL HISTORY: Stacy Chung is a 45 year old with above-mentioned history of right breast cancer who was on tamoxifen and developed pulmonary embolism and the treatment was discontinued in May 2017. She had planned to undergo oophorectomy and hysterectomy but the surgery was postponed because of recent blood clot. She was encouraged to see me for follow-up to discuss when would be an ideal time for her to undergo surgery. Patient is tolerating Xarelto extremely well. She reports no signs or symptoms of bruising or bleeding. Her other major complaint is that over the past month she has had 3 episodes of heavy menstrual bleeding. She previously had once every quarter a slight amount of bleeding but now for the past month she had 3 episodes of heavy periods. She has not informed Dr. cousins about this yet. She is also complaining of acne on her face which appears to be persistent.  REVIEW OF SYSTEMS:   Constitutional: Denies fevers, chills or abnormal weight loss Eyes: Denies blurriness of vision Ears, nose, mouth, throat, and face: Denies mucositis or sore throat Respiratory: Denies cough, dyspnea or wheezes Cardiovascular: Denies palpitation, chest discomfort Gastrointestinal:  Abdominal fullness and abdominal wall weakness Skin: Denies abnormal skin rashes Lymphatics: Denies new lymphadenopathy or easy bruising Neurological:Denies numbness, tingling or new weaknesses Behavioral/Psych: Mood is stable, no new changes  Extremities: No lower extremity edema  All other systems were reviewed with the patient and are  negative.  I have reviewed the past medical history, past surgical history, social history and family history with the patient and they are unchanged from previous note.  ALLERGIES:  is allergic to other; macrodantin; pertussis vaccines; and tape.  MEDICATIONS:  Current Outpatient Prescriptions  Medication Sig Dispense Refill  . cetirizine (ZYRTEC) 5 MG tablet Take 1 tablet (5 mg total) by mouth daily. 30 tablet 3  . Cholecalciferol (VITAMIN D3) 10000 UNITS capsule Take 10,000 Units by mouth daily. Only 5 days a week     . clindamycin (CLINDAGEL) 1 % gel Apply topically 2 (two) times daily. 30 g 0  . enoxaparin (LOVENOX) 80 MG/0.8ML injection Inject 0.8 mLs (80 mg total) into the skin every 12 (twelve) hours. 8 Syringe 0  . fexofenadine-pseudoephedrine (ALLEGRA-D 24) 180-240 MG 24 hr tablet Take 1 tablet by mouth daily.    Marland Kitchen levocetirizine (XYZAL) 5 MG tablet Take 1 tablet (5 mg total) by mouth every evening. 30 tablet 3  . levothyroxine (SYNTHROID, LEVOTHROID) 75 MCG tablet Take 75 mcg by mouth daily.      . Rivaroxaban (XARELTO) 15 MG TABS tablet Take 1 tablet (15 mg total) by mouth daily with supper. 14 tablet 0  . rivaroxaban (XARELTO) 20 MG TABS tablet Take 1 tablet (20 mg total) by mouth daily with supper. 30 tablet 6  . ZOLMitriptan (ZOMIG) 2.5 MG tablet Take 2.5 mg by mouth as needed.       No current facility-administered medications for this visit.    PHYSICAL EXAMINATION: ECOG PERFORMANCE STATUS: 1 - Symptomatic but completely ambulatory  Filed Vitals:   01/29/16 1422  BP: 132/73  Pulse: 87  Temp: 98.3 F (36.8 C)  Resp: 18   Filed Weights   01/29/16 1422  Weight: 178 lb 1.6 oz (80.786 kg)    GENERAL:alert, no distress and comfortable SKIN: skin color, texture, turgor are normal, no rashes or significant lesions EYES: normal, Conjunctiva are pink and non-injected, sclera clear OROPHARYNX:no exudate, no erythema and lips, buccal mucosa, and tongue normal  NECK:  supple, thyroid normal size, non-tender, without nodularity LYMPH:  no palpable lymphadenopathy in the cervical, axillary or inguinal LUNGS: clear to auscultation and percussion with normal breathing effort HEART: regular rate & rhythm and no murmurs and no lower extremity edema ABDOMEN:abdomen soft, non-tender and normal bowel sounds MUSCULOSKELETAL:no cyanosis of digits and no clubbing  NEURO: alert & oriented x 3 with fluent speech, no focal motor/sensory deficits EXTREMITIES: No lower extremity edema  LABORATORY DATA:  I have reviewed the data as listed   Chemistry      Component Value Date/Time   NA 137 12/20/2015 1120   K 3.7 12/20/2015 1120   CL 109 12/20/2015 1120   CO2 21* 12/20/2015 1120   BUN 14 12/20/2015 1120   CREATININE 0.72 12/20/2015 1120   CREATININE 0.77 09/06/2013 1551      Component Value Date/Time   CALCIUM 8.8* 12/20/2015 1120   ALKPHOS 65 12/20/2015 1120   AST 30 12/20/2015 1120   ALT 41 12/20/2015 1120   BILITOT 0.5 12/20/2015 1120       Lab Results  Component Value Date   WBC 6.9 12/20/2015   HGB 12.8 12/20/2015   HCT 37.3 12/20/2015   MCV 89.2 12/20/2015   PLT 477* 12/20/2015   NEUTROABS 3.8 12/20/2015     ASSESSMENT & PLAN:  1. Pulmonary embolism: She has  been on Xarelto for the past 2 months. Our plan is to obtain a CT of the chest in one month and if it shows resolution of blood clot, she may then proceed with surgery. I will also obtain d-dimer testing. 2. Breast cancer: Off tamoxifen. Patient has Mirena.  3. Heavy menstrual bleeding lately: I encouraged her to speak with Dr. Garwin Brothers regarding this issue. She may need further evaluation/biopsy part they may decide to proceed with hysterectomy and salpingo-oophorectomy as being considered currently. 4. Severe acne: I prescribed her topical clindamycin 1% gel. 5. Return to clinic in one month with CT scan and follow-up.  Orders Placed This Encounter  Procedures  . CT ANGIO CHEST PE W OR  WO CONTRAST    Standing Status: Future     Number of Occurrences:      Standing Expiration Date: 03/04/2017    Order Specific Question:  Reason for exam:    Answer:  Pulmonary embolism reevaluation    Order Specific Question:  Is the patient pregnant?    Answer:  No    Order Specific Question:  Preferred imaging location?    Answer:  [facility]  . D-dimer, quantitative    Standing Status: Future     Number of Occurrences:      Standing Expiration Date: 01/28/2017   The patient has a good understanding of the overall plan. she agrees with it. she will call with any problems that may develop before the next visit here.   Rulon Eisenmenger, MD 01/29/2016

## 2016-02-19 DIAGNOSIS — Z85850 Personal history of malignant neoplasm of thyroid: Secondary | ICD-10-CM | POA: Diagnosis not present

## 2016-02-20 DIAGNOSIS — Z7901 Long term (current) use of anticoagulants: Secondary | ICD-10-CM | POA: Diagnosis not present

## 2016-02-20 DIAGNOSIS — K529 Noninfective gastroenteritis and colitis, unspecified: Secondary | ICD-10-CM | POA: Diagnosis not present

## 2016-02-20 DIAGNOSIS — I2699 Other pulmonary embolism without acute cor pulmonale: Secondary | ICD-10-CM | POA: Diagnosis not present

## 2016-02-20 DIAGNOSIS — Z6828 Body mass index (BMI) 28.0-28.9, adult: Secondary | ICD-10-CM | POA: Diagnosis not present

## 2016-02-20 DIAGNOSIS — C50919 Malignant neoplasm of unspecified site of unspecified female breast: Secondary | ICD-10-CM | POA: Diagnosis not present

## 2016-02-20 DIAGNOSIS — E038 Other specified hypothyroidism: Secondary | ICD-10-CM | POA: Diagnosis not present

## 2016-02-22 ENCOUNTER — Telehealth: Payer: Self-pay | Admitting: Hematology and Oncology

## 2016-02-22 NOTE — Telephone Encounter (Signed)
pt cld to r/s appt-gave pt time per pt req-she is aware of times

## 2016-02-29 ENCOUNTER — Encounter (HOSPITAL_COMMUNITY): Payer: Self-pay

## 2016-02-29 ENCOUNTER — Ambulatory Visit (HOSPITAL_COMMUNITY)
Admission: RE | Admit: 2016-02-29 | Discharge: 2016-02-29 | Disposition: A | Discharge status: Home / Self Care | Payer: BLUE CROSS/BLUE SHIELD | Source: Ambulatory Visit | Attending: Hematology and Oncology | Admitting: Hematology and Oncology

## 2016-02-29 ENCOUNTER — Other Ambulatory Visit: Payer: Self-pay

## 2016-02-29 DIAGNOSIS — R160 Hepatomegaly, not elsewhere classified: Secondary | ICD-10-CM | POA: Insufficient documentation

## 2016-02-29 DIAGNOSIS — Z9889 Other specified postprocedural states: Secondary | ICD-10-CM | POA: Diagnosis not present

## 2016-02-29 DIAGNOSIS — I2699 Other pulmonary embolism without acute cor pulmonale: Secondary | ICD-10-CM | POA: Diagnosis not present

## 2016-02-29 DIAGNOSIS — Z86711 Personal history of pulmonary embolism: Secondary | ICD-10-CM | POA: Diagnosis not present

## 2016-02-29 MED ORDER — IOPAMIDOL (ISOVUE-370) INJECTION 76%
100.0000 mL | Freq: Once | INTRAVENOUS | Status: AC | PRN
  Administered 2016-02-29: 100 mL via INTRAVENOUS

## 2016-03-03 ENCOUNTER — Ambulatory Visit: Payer: BLUE CROSS/BLUE SHIELD | Admitting: Hematology and Oncology

## 2016-03-03 ENCOUNTER — Other Ambulatory Visit: Payer: BLUE CROSS/BLUE SHIELD

## 2016-03-03 ENCOUNTER — Ambulatory Visit (HOSPITAL_BASED_OUTPATIENT_CLINIC_OR_DEPARTMENT_OTHER): Payer: BLUE CROSS/BLUE SHIELD | Admitting: Hematology and Oncology

## 2016-03-03 ENCOUNTER — Other Ambulatory Visit (HOSPITAL_COMMUNITY)
Admission: RE | Admit: 2016-03-03 | Discharge: 2016-03-03 | Disposition: A | Discharge status: Home / Self Care | Payer: BLUE CROSS/BLUE SHIELD | Source: Ambulatory Visit | Attending: Hematology and Oncology | Admitting: Hematology and Oncology

## 2016-03-03 ENCOUNTER — Encounter: Payer: Self-pay | Admitting: Hematology and Oncology

## 2016-03-03 VITALS — BP 112/48 | HR 82 | Temp 98.4°F | Resp 17 | Wt 178.4 lb

## 2016-03-03 DIAGNOSIS — I2699 Other pulmonary embolism without acute cor pulmonale: Secondary | ICD-10-CM | POA: Insufficient documentation

## 2016-03-03 DIAGNOSIS — C50511 Malignant neoplasm of lower-outer quadrant of right female breast: Secondary | ICD-10-CM

## 2016-03-03 DIAGNOSIS — K7689 Other specified diseases of liver: Secondary | ICD-10-CM

## 2016-03-03 DIAGNOSIS — L709 Acne, unspecified: Secondary | ICD-10-CM

## 2016-03-03 DIAGNOSIS — K769 Liver disease, unspecified: Secondary | ICD-10-CM

## 2016-03-03 LAB — D-DIMER, QUANTITATIVE: D-Dimer, Quant: <0.27 ug/mL-FEU (ref 0.00–0.50)

## 2016-03-03 MED ORDER — RIVAROXABAN 20 MG PO TABS: 20.0000 mg | ORAL_TABLET | Freq: Every day | ORAL | 0 refills | Status: DC

## 2016-03-03 MED ORDER — CLINDAMYCIN PHOSPHATE 1 % EX GEL: Freq: Two times a day (BID) | CUTANEOUS | 0 refills | Status: DC

## 2016-03-03 NOTE — Progress Notes (Signed)
Patient Care Team: [name], MD as PCP - General (Internal Medicine) [name], NP as Nurse Practitioner (Obstetrics and Gynecology) [name], MD as Consulting Physician (Sports Medicine) [name], MD as Consulting Physician (Hematology and Oncology) [name], MD as Consulting Physician (General Surgery) [name], NP as Nurse Practitioner (Hematology and Oncology) [name], MD as Consulting Physician (Obstetrics and Gynecology)  DIAGNOSIS: Breast cancer of lower-outer quadrant of right female breast [name])   Staging form: Breast, AJCC 7th Edition   - Clinical stage from 07/25/2015: Stage IA (T1b, N0, M0) - Unsigned   - Pathologic stage from 08/28/2015: Stage IA (T1a, N0, cM0) - Unsigned  SUMMARY OF ONCOLOGIC HISTORY:   Hodgkin's lymphoma ([place])   06/21/1979 Initial Diagnosis    Stage III Hodgkin's disease with cervical, retroperitoneal lymph nodes, treated with ABVD-MOPP (alternating) followed by chest and abdominal radiation at [facility] with complete response      09/23/2001 Pathology Results    Melanoma stage I on the back treated with resection      09/27/2002 Pathology Results    Thyroid cancer treated with thyroidectomy       Breast cancer of lower-outer quadrant of right female breast ([name])   07/18/2015 Breast MRI    Rt breast mass 0.9 x 0.8 x 0.8 cm, with clumped NME 4 cm, 2 sites of Bx proven flat epithelial atypia      07/25/2015 Initial Diagnosis    Rt Breast Biopsy: IDC with calcs and DCIS and ALH, Er 100%, PR 100%, Ki 67 5%, Her 2 Neg Ratio 1.35      07/25/2015 Clinical Stage    Stage IA: T1b N0      07/25/2015 Oncotype testing    RS 6 (5% ROR)      07/26/2015 Procedure    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 Stopped May 2017 for pulmonary embolism      10/31/2015 Survivorship    Care plan completed and mailed to patient in lieu of in person visit at her request      11/20/2015 - 11/23/2015 Hospital Admission    Hospitalization for pulmonary embolism       CHIEF COMPLIANT: Follow-up discuss results of the CT chest and d-dimer  INTERVAL HISTORY: Stacy Chung is a 45 year old with above-mentioned history of multiple cancers including the right breast cancer currently on surveillance. She was diagnosed with pulmonary embolism and tamoxifen was discontinued in May 2017. She has been on xarelto for the past 3 months. She had repeat CT of the chest and blood work with d-dimer and is here to discuss the results. She expressed significant concern regarding her liver. Previous biopsies PET scan sliver MRIs have suggested that this may be a benign disease and even the most recent CT scan suggests that the liver lesion stable. She will be getting hysterectomy with bilateral salpingectomy oophorectomy in [place].   REVIEW OF SYSTEMS:   Constitutional: Denies fevers, chills or abnormal weight loss Eyes: Denies blurriness of vision Ears, nose, mouth, throat, and face: Denies mucositis or sore throat Respiratory: Denies cough, dyspnea or wheezes Cardiovascular: Denies palpitation, chest discomfort Gastrointestinal:  Denies nausea, heartburn or change in bowel habits Skin: Denies  abnormal skin rashes Lymphatics: Denies new lymphadenopathy or easy bruising Neurological:Denies numbness, tingling or new weaknesses Behavioral/Psych: Mood is stable, no new changes  Extremities: No lower extremity edema  All other systems were reviewed with the patient and are negative.  I have reviewed the past medical history, past surgical history, social history and family history with the patient and they are unchanged from previous  note.  ALLERGIES:  is allergic to other; macrodantin; pertussis vaccines; and tape.  MEDICATIONS:  Current Outpatient Prescriptions  Medication Sig Dispense Refill  . cetirizine (ZYRTEC) 5 MG tablet Take 1 tablet (5 mg total) by mouth daily. 30 tablet 3  . Cholecalciferol (VITAMIN D3) 10000 UNITS capsule Take 10,000 Units by mouth daily. Only 5 days a week     . clindamycin (CLINDAGEL) 1 % gel Apply topically 2 (two) times daily. 30 g 0  . fexofenadine-pseudoephedrine (ALLEGRA-D 24) 180-240 MG 24 hr tablet Take 1 tablet by mouth daily.    Marland Kitchen levocetirizine (XYZAL) 5 MG tablet Take 1 tablet (5 mg total) by mouth every evening. 30 tablet 3  . levothyroxine (SYNTHROID, LEVOTHROID) 75 MCG tablet Take 75 mcg by mouth daily.      . rivaroxaban (XARELTO) 20 MG TABS tablet Take 1 tablet (20 mg total) by mouth daily with supper. 90 tablet 0  . ZOLMitriptan (ZOMIG) 2.5 MG tablet Take 2.5 mg by mouth as needed.       No current facility-administered medications for this visit.     PHYSICAL EXAMINATION: ECOG PERFORMANCE STATUS: 1 - Symptomatic but completely ambulatory  Vitals:   03/03/16 1532  BP: (!) 112/48  Pulse: 82  Resp: 17  Temp: 98.4 F (36.9 C)   Filed Weights   03/03/16 1532  Weight: 178 lb 6.4 oz (80.9 kg)    GENERAL:alert, no distress and comfortable SKIN: skin color, texture, turgor are normal, no rashes or significant lesions EYES: normal, Conjunctiva are pink and non-injected, sclera clear OROPHARYNX:no exudate, no erythema and lips, buccal mucosa, and tongue normal  NECK: supple, thyroid normal size, non-tender, without nodularity LYMPH:  no palpable lymphadenopathy in the cervical, axillary or inguinal LUNGS: clear to auscultation and percussion with normal breathing effort HEART: regular rate & rhythm and no murmurs and no lower extremity edema ABDOMEN:abdomen soft, non-tender and normal bowel sounds MUSCULOSKELETAL:no cyanosis of digits and no clubbing  NEURO: alert  & oriented x 3 with fluent speech, no focal motor/sensory deficits EXTREMITIES: No lower extremity edema  LABORATORY DATA:  I have reviewed the data as listed   Chemistry      Component Value Date/Time   NA 137 12/20/2015 1120   K 3.7 12/20/2015 1120   CL 109 12/20/2015 1120   CO2 21 (L) 12/20/2015 1120   BUN 14 12/20/2015 1120   CREATININE 0.72 12/20/2015 1120   CREATININE 0.77 09/06/2013 1551      Component Value Date/Time   CALCIUM 8.8 (L) 12/20/2015 1120   ALKPHOS 65 12/20/2015 1120   AST 30 12/20/2015 1120   ALT 41 12/20/2015 1120   BILITOT 0.5 12/20/2015 1120       Lab Results  Component Value Date   WBC 6.9 12/20/2015   HGB 12.8 12/20/2015   HCT 37.3 12/20/2015   MCV 89.2 12/20/2015   PLT 477 (H) 12/20/2015   NEUTROABS 3.8 12/20/2015     ASSESSMENT & PLAN:  Breast cancer of lower-outer quadrant of right female breast ([place]) 1. Pulmonary embolism: She has been on Xarelto for the past 2  months.  CT chest 02/29/2016: No acute or significant pulmonary embolus, previous small bilateral emboli have completely resolved. Since a CT scan showed resolution of blood clot, she may then proceed with surgery with hysterectomy and bilateral salpingo-oophorectomy.  Recommendation to her surgeon Dr.Delacroce: Patient could come on Xarelto 2 days prior to surgery without a Lovenox bridge and then to resume the Xarelto the day after surgery. Patient would most likely get her surgery in the next few months.  2. Breast cancer: Off tamoxifen because of blood clots. Patient will soon undergo hysterectomy with bilateral BSO.  3. Heavy menstrual bleeding lately: Hysterectomy will resolve most of her problems, the bleeding itself has improved recently  4. Severe acne: Topical clindamycin 1% gel. 5. 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 She had a biopsy of one of the lesions most likely the fatty tumor lesion which came back negative for  malignancy. We would like to obtain at liver MRI at the end of this year to evaluate this further.   Return to clinic in 6 months for follow-up After the liver MRI.    Orders Placed This Encounter  Procedures  . MR Liver W Contrast    Standing Status:   Future    Standing Expiration Date:   03/03/2017    Order Specific Question:   If indicated for the ordered procedure, I authorize the administration of contrast media per Radiology protocol    Answer:   Yes    Order Specific Question:   Reason for Exam (SYMPTOM  OR DIAGNOSIS REQUIRED)    Answer:   lIVER LESIONS EVALUATE FOR STABILITY WITH h/o BREAST CANCER    Order Specific Question:   Preferred imaging location?    Answer:   [facility] (table limit-550lbs)    Order Specific Question:   What is the patient's sedation requirement?    Answer:   No Sedation    Order Specific Question:   Does the patient have a pacemaker or implanted devices?    Answer:   No   The patient has a good understanding of the overall plan. she agrees with it. she will call with any problems that may develop before the next visit here.   Rulon Eisenmenger, MD 03/03/16

## 2016-03-03 NOTE — Assessment & Plan Note (Addendum)
1. Pulmonary embolism: She has been on Xarelto for the past 2 months.  CT chest 02/29/2016: No acute or significant pulmonary embolus, previous small bilateral emboli have completely resolved. Since a CT scan showed resolution of blood clot, she may then proceed with surgery with hysterectomy and bilateral salpingo-oophorectomy.  2. Breast cancer: Off tamoxifen because of blood clots. Patient has Mirena. Patient will soon undergo hysterectomy.  3. Heavy menstrual bleeding lately: Hysterectomy will resolve most of her problems  4. Severe acne: Topical clindamycin 1% gel.  Return to clinic in 6 months for follow-up

## 2016-03-06 ENCOUNTER — Other Ambulatory Visit: Payer: Self-pay

## 2016-03-06 DIAGNOSIS — K769 Liver disease, unspecified: Secondary | ICD-10-CM

## 2016-03-06 DIAGNOSIS — C50511 Malignant neoplasm of lower-outer quadrant of right female breast: Secondary | ICD-10-CM

## 2016-03-07 ENCOUNTER — Encounter: Payer: Self-pay | Admitting: Hematology and Oncology

## 2016-03-12 DIAGNOSIS — M5413 Radiculopathy, cervicothoracic region: Secondary | ICD-10-CM | POA: Diagnosis not present

## 2016-03-12 DIAGNOSIS — M62830 Muscle spasm of back: Secondary | ICD-10-CM | POA: Diagnosis not present

## 2016-03-12 DIAGNOSIS — M546 Pain in thoracic spine: Secondary | ICD-10-CM | POA: Diagnosis not present

## 2016-03-12 DIAGNOSIS — R51 Headache: Secondary | ICD-10-CM | POA: Diagnosis not present

## 2016-03-14 DIAGNOSIS — B9689 Other specified bacterial agents as the cause of diseases classified elsewhere: Secondary | ICD-10-CM | POA: Diagnosis not present

## 2016-03-14 DIAGNOSIS — J069 Acute upper respiratory infection, unspecified: Secondary | ICD-10-CM | POA: Diagnosis not present

## 2016-03-14 DIAGNOSIS — R0602 Shortness of breath: Secondary | ICD-10-CM | POA: Diagnosis not present

## 2016-03-14 DIAGNOSIS — J209 Acute bronchitis, unspecified: Secondary | ICD-10-CM | POA: Diagnosis not present

## 2016-04-30 ENCOUNTER — Encounter: Payer: Self-pay | Admitting: Hematology and Oncology

## 2016-05-05 DIAGNOSIS — I447 Left bundle-branch block, unspecified: Secondary | ICD-10-CM | POA: Diagnosis not present

## 2016-05-05 DIAGNOSIS — Z01818 Encounter for other preprocedural examination: Secondary | ICD-10-CM | POA: Diagnosis not present

## 2016-05-05 DIAGNOSIS — R9431 Abnormal electrocardiogram [ECG] [EKG]: Secondary | ICD-10-CM | POA: Diagnosis not present

## 2016-05-05 DIAGNOSIS — Z23 Encounter for immunization: Secondary | ICD-10-CM | POA: Diagnosis not present

## 2016-05-05 DIAGNOSIS — R8299 Other abnormal findings in urine: Secondary | ICD-10-CM | POA: Diagnosis not present

## 2016-05-05 DIAGNOSIS — C50919 Malignant neoplasm of unspecified site of unspecified female breast: Secondary | ICD-10-CM | POA: Diagnosis not present

## 2016-05-05 DIAGNOSIS — R358 Other polyuria: Secondary | ICD-10-CM | POA: Diagnosis not present

## 2016-05-08 DIAGNOSIS — R0602 Shortness of breath: Secondary | ICD-10-CM | POA: Diagnosis not present

## 2016-05-13 ENCOUNTER — Other Ambulatory Visit (HOSPITAL_COMMUNITY): Payer: Self-pay | Admitting: Internal Medicine

## 2016-05-13 ENCOUNTER — Ambulatory Visit (HOSPITAL_COMMUNITY): Payer: BLUE CROSS/BLUE SHIELD | Attending: Cardiology

## 2016-05-13 DIAGNOSIS — I447 Left bundle-branch block, unspecified: Secondary | ICD-10-CM | POA: Diagnosis not present

## 2016-05-13 DIAGNOSIS — R9431 Abnormal electrocardiogram [ECG] [EKG]: Secondary | ICD-10-CM

## 2016-05-13 DIAGNOSIS — I10 Essential (primary) hypertension: Secondary | ICD-10-CM | POA: Diagnosis not present

## 2016-05-13 LAB — MYOCARDIAL PERFUSION IMAGING
CHL CUP NUCLEAR SDS: 7
CHL CUP NUCLEAR SRS: 1
CHL CUP RESTING HR STRESS: 77 {beats}/min
CSEPPHR: 111 {beats}/min
LV dias vol: 82 mL (ref 46–106)
LVSYSVOL: 34 mL
RATE: 0.39
SSS: 18
TID: 0.85

## 2016-05-13 MED ORDER — TECHNETIUM TC 99M TETROFOSMIN IV KIT
10.2000 | PACK | Freq: Once | INTRAVENOUS | Status: AC | PRN
  Administered 2016-05-13: 10.2 via INTRAVENOUS
  Filled 2016-05-13: qty 11

## 2016-05-13 MED ORDER — REGADENOSON 0.4 MG/5ML IV SOLN
0.4000 mg | Freq: Once | INTRAVENOUS | Status: AC
  Administered 2016-05-13: 0.4 mg via INTRAVENOUS

## 2016-05-13 MED ORDER — TECHNETIUM TC 99M TETROFOSMIN IV KIT
33.0000 | PACK | Freq: Once | INTRAVENOUS | Status: AC | PRN
  Administered 2016-05-13: 33 via INTRAVENOUS
  Filled 2016-05-13: qty 33

## 2016-05-14 ENCOUNTER — Encounter (HOSPITAL_COMMUNITY): Payer: BLUE CROSS/BLUE SHIELD

## 2016-06-18 DIAGNOSIS — D72829 Elevated white blood cell count, unspecified: Secondary | ICD-10-CM | POA: Diagnosis not present

## 2016-06-18 DIAGNOSIS — R740 Nonspecific elevation of levels of transaminase and lactic acid dehydrogenase [LDH]: Secondary | ICD-10-CM | POA: Diagnosis not present

## 2016-06-23 ENCOUNTER — Ambulatory Visit
Admission: RE | Admit: 2016-06-23 | Discharge: 2016-06-23 | Disposition: A | Discharge status: Home / Self Care | Payer: BLUE CROSS/BLUE SHIELD | Source: Ambulatory Visit | Attending: Hematology and Oncology | Admitting: Hematology and Oncology

## 2016-06-23 ENCOUNTER — Other Ambulatory Visit: Payer: Self-pay | Admitting: Hematology and Oncology

## 2016-06-23 DIAGNOSIS — N941 Unspecified dyspareunia: Secondary | ICD-10-CM | POA: Diagnosis not present

## 2016-06-23 DIAGNOSIS — C50511 Malignant neoplasm of lower-outer quadrant of right female breast: Secondary | ICD-10-CM

## 2016-06-23 DIAGNOSIS — Z90710 Acquired absence of both cervix and uterus: Secondary | ICD-10-CM | POA: Diagnosis not present

## 2016-06-23 DIAGNOSIS — K769 Liver disease, unspecified: Secondary | ICD-10-CM

## 2016-06-23 DIAGNOSIS — Z1502 Genetic susceptibility to malignant neoplasm of ovary: Secondary | ICD-10-CM | POA: Diagnosis not present

## 2016-06-25 ENCOUNTER — Ambulatory Visit
Admission: RE | Admit: 2016-06-25 | Discharge: 2016-06-25 | Disposition: A | Discharge status: Home / Self Care | Payer: BLUE CROSS/BLUE SHIELD | Source: Ambulatory Visit | Attending: Hematology and Oncology | Admitting: Hematology and Oncology

## 2016-06-25 DIAGNOSIS — K769 Liver disease, unspecified: Secondary | ICD-10-CM

## 2016-06-25 DIAGNOSIS — C50511 Malignant neoplasm of lower-outer quadrant of right female breast: Secondary | ICD-10-CM

## 2016-06-25 DIAGNOSIS — K7689 Other specified diseases of liver: Secondary | ICD-10-CM | POA: Diagnosis not present

## 2016-06-25 MED ORDER — GADOXETATE DISODIUM 0.25 MMOL/ML IV SOLN
8.0000 mL | Freq: Once | INTRAVENOUS | Status: AC | PRN
  Administered 2016-06-25: 8 mL via INTRAVENOUS

## 2016-06-25 NOTE — Assessment & Plan Note (Signed)
Pulmonary embolism: She has been on Xarelto for the past 2 months.  CT chest 02/29/2016: No acute or significant pulmonary embolus, previous small bilateral emboli have completely resolved. Since a CT scan showed resolution of blood clot, she may then proceed with surgery with hysterectomy and bilateral salpingo-oophorectomy.  Recommendation to her surgeon Dr.Delacroce: Patient could come on Xarelto 2 days prior to surgery without a Lovenox bridge and then to resume the Xarelto the day after surgery. Patient would most likely get her surgery in the next few months.  2. Breast cancer: Off tamoxifen because of blood clots. Patient will soon undergo hysterectomy with bilateral BSO.  3. Heavy menstrual bleeding lately: Hysterectomy will resolve most of her problems, the bleeding itself has improved recently  4. Severe acne: Topical clindamycin 1% gel. 5. Liver lesions: Liver MRI 06/25/16: Stable findings  Return to clinic in 6 months for follow-up

## 2016-06-25 NOTE — Assessment & Plan Note (Signed)
Rt Breast mass 07/18/15: MRI breast: 0.9 x 0.8 x 0.8 cm, with clumped NME 4 cm, 2 sites of Bx proven flat epithelial atypia, T1bN0 (stage 1A) Rt Breast Biopsy 07/25/15: IDC with calcs and DCIS and ALH, Er 100%, PR 100%, Ki 67 5%, Her 2 Neg Ratio 1.35 Bilateral mastectomies 08/28/2015: In [place], 1.5 cm residual high-grade DCIS Tis N0 stage 0 Final pathologic staging: T1a N0 stage IA DX recurrence score 6, 5% risk of recurrence with tamoxifen  Current treatment: Tamoxifen 20 mg daily stopped due to pulmonary embolism.  Once she has oophorectomy, we will start anastrozole.  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 (Patient to have oophorectomy in [place] and of June) Pulmonary embolism: Most likely due to tamoxifen therapy. We stopped tamoxifen. Patient has pleural effusion most likely related to pulmonary embolism. I do not believe there is any need to do a thoracentesis. I sent a prescription for Xarelto.  PET CT scan: 12/06/2015: Right lobe of the liver lesion hypermetabolic on PET scan although it is relatively unchanged from July 2015. I will contact interventional radiology to obtain a liver biopsy.  Anticoagulation perioperative instructions: Patient has scheduled for oophorectomy on July 20. I instructed her to stop Xarelto on July 17 and start Lovenox 80 mg subcutaneous twice a day until July 19. She will resume Xarelto on July 21  General cancer surveillance: 1. Recommended colonoscopy every 5 years because of abdominal radiation 2. Patient plans to undergo hysterectomy with bilateral salpingo-oophorectomy (in June 2017). 3. No imaging studies required for the breast because she had bilateral mastectomies

## 2016-06-26 ENCOUNTER — Encounter: Payer: Self-pay | Admitting: Hematology and Oncology

## 2016-06-26 ENCOUNTER — Ambulatory Visit (HOSPITAL_BASED_OUTPATIENT_CLINIC_OR_DEPARTMENT_OTHER): Payer: BLUE CROSS/BLUE SHIELD | Admitting: Hematology and Oncology

## 2016-06-26 VITALS — BP 115/54 | HR 81 | Temp 97.8°F | Resp 20 | Ht 65.0 in | Wt 169.8 lb

## 2016-06-26 DIAGNOSIS — C50511 Malignant neoplasm of lower-outer quadrant of right female breast: Secondary | ICD-10-CM

## 2016-06-26 DIAGNOSIS — K769 Liver disease, unspecified: Secondary | ICD-10-CM

## 2016-06-26 DIAGNOSIS — I2699 Other pulmonary embolism without acute cor pulmonale: Secondary | ICD-10-CM

## 2016-06-26 DIAGNOSIS — Z170 Estrogen receptor positive status [ER+]: Secondary | ICD-10-CM

## 2016-06-26 MED ORDER — BIOTIN 2500 MCG PO CAPS: 2500.0000 ug | ORAL_CAPSULE | Freq: Every day | ORAL | Status: DC

## 2016-06-26 MED ORDER — LETROZOLE 2.5 MG PO TABS: 2.5000 mg | ORAL_TABLET | Freq: Every day | ORAL | 3 refills | Status: DC

## 2016-06-26 MED ORDER — ESOMEPRAZOLE MAGNESIUM 40 MG PO CPDR: 40.0000 mg | DELAYED_RELEASE_CAPSULE | Freq: Every day | ORAL | Status: DC

## 2016-06-26 MED ORDER — CIPROFLOXACIN HCL 500 MG PO TABS: 500.0000 mg | ORAL_TABLET | Freq: Two times a day (BID) | ORAL | 0 refills | Status: DC

## 2016-06-26 NOTE — Progress Notes (Signed)
Patient Care Team: [name], MD as PCP - General (Internal Medicine) [name], NP as Nurse Practitioner (Obstetrics and Gynecology) [name], MD as Consulting Physician (Sports Medicine) [name], MD as Consulting Physician (Hematology and Oncology) [name], MD as Consulting Physician (General Surgery) [name], NP as Nurse Practitioner (Hematology and Oncology) [name], MD as Consulting Physician (Obstetrics and Gynecology)  DIAGNOSIS:  Encounter Diagnoses  Name Primary?  . Malignant neoplasm of lower-outer quadrant of right breast of female, estrogen receptor positive ([place])   . Other acute pulmonary embolism without acute cor pulmonale ([place]) Yes    SUMMARY OF ONCOLOGIC HISTORY:   Hodgkin's lymphoma ([place])   06/21/1979 Initial Diagnosis    Stage III Hodgkin's disease with cervical, retroperitoneal lymph nodes, treated with ABVD-MOPP (alternating) followed by chest and abdominal radiation at [facility] with complete response      09/23/2001 Pathology Results    Melanoma stage I on the back treated with resection      09/27/2002 Pathology Results    Thyroid cancer treated with thyroidectomy       Breast cancer of lower-outer quadrant of right female breast ([place])   07/18/2015 Breast MRI    Rt breast mass 0.9 x 0.8 x 0.8 cm, with clumped NME 4 cm, 2 sites of Bx proven flat epithelial atypia      07/25/2015 Initial Diagnosis    Rt Breast Biopsy: IDC with calcs and DCIS and ALH, Er 100%, PR 100%, Ki 67 5%, Her 2 Neg Ratio 1.35      07/25/2015 Clinical Stage    Stage IA: T1b N0      07/25/2015 Oncotype testing    RS 6 (5% ROR)      07/26/2015 Procedure    Genetic testing revealed BRIP-1      08/28/2015 Surgery    Bilateral mastectomies with reconstruction in [place] at [place] for restorative surgery (Dr.Delacroce): Residual high-grade DCIS 1.5 cm, 0/2 sentinel nodes      09/24/2015 - 11/29/2015  Anti-estrogen oral therapy    Tamoxifen 20 mg daily. Stopped May 2017 for pulmonary embolism      10/31/2015 Survivorship    Care plan completed and mailed to patient in lieu of in person visit at her request      11/20/2015 - 11/23/2015 Hospital Admission    Hospitalization for pulmonary embolism      05/15/2016 Surgery    Hysterectomy and BSO       CHIEF COMPLIANT: Follow-up to discuss the adjuvant treatment plan  INTERVAL HISTORY: Stacy Chung is a 45 year old with above-mentioned history of bilateral mastectomies for right breast cancer. She had pulmonary embolism and hence tamoxifen was stopped. She completed breast reconstruction surgery. She underwent hysterectomy and bilateral salpingo-oophorectomy. She is here to commence treatment with antiestrogen treatment with aromatase inhibitors. She had a liver MRI which showed stable findings in the liver as before.  REVIEW OF SYSTEMS:   Constitutional: Denies fevers, chills or abnormal weight loss Eyes: Denies blurriness of vision Ears, nose, mouth, throat, and face: Denies mucositis or sore throat Respiratory: Denies cough, dyspnea or wheezes Cardiovascular: Denies palpitation, chest discomfort Gastrointestinal:  Denies nausea, heartburn or change in bowel habits Skin: Denies abnormal skin rashes Lymphatics: Denies new lymphadenopathy or easy bruising Neurological:Denies numbness, tingling or new weaknesses Behavioral/Psych: Mood is stable, no new changes  Extremities: No lower extremity edema Breast:  Bilateral breast reconstruction All other systems were reviewed with the patient and are negative.  I have reviewed the past medical history, past surgical history, social history and family history with the patient and they are unchanged from previous note.  ALLERGIES:  is allergic to other; macrodantin; pertussis vaccines; and tape.  MEDICATIONS:  Current Outpatient Prescriptions  Medication Sig Dispense Refill  . Biotin  2500 MCG CAPS Take 2,500 mcg by mouth daily. 30 capsule   . cetirizine (ZYRTEC) 5 MG tablet Take 1 tablet (5 mg total) by mouth daily. 30 tablet 3  . Cholecalciferol (VITAMIN D3) 10000 UNITS capsule Take 10,000 Units by mouth daily. Only 5 days a week     . ciprofloxacin (CIPRO) 500 MG tablet Take 1 tablet (500 mg total) by mouth 2 (two) times daily. 14 tablet 0  . clindamycin (CLINDAGEL) 1 % gel Apply topically 2 (two) times daily. 30 g 0  . esomeprazole (NEXIUM) 40 MG capsule Take 1 capsule (40 mg total) by mouth daily at 12 noon.    . fexofenadine-pseudoephedrine (ALLEGRA-D 24) 180-240 MG 24 hr tablet Take 1 tablet by mouth daily.    Marland Kitchen letrozole (FEMARA) 2.5 MG tablet Take 1 tablet (2.5 mg total) by mouth daily. 90 tablet 3  . levothyroxine (SYNTHROID, LEVOTHROID) 75 MCG tablet Take 75 mcg by mouth daily.      Marland Kitchen ZOLMitriptan (ZOMIG) 2.5 MG tablet Take 2.5 mg by mouth as needed.       No current facility-administered medications for this visit.     PHYSICAL EXAMINATION: ECOG PERFORMANCE STATUS: 1 - Symptomatic but completely ambulatory  Vitals:   06/26/16 1158  BP: (!) 115/54  Pulse: 81  Resp: 20  Temp: 97.8 F (36.6 C)   Filed Weights   06/26/16 1158  Weight: 169 lb 12.8 oz (77 kg)    GENERAL:alert, no distress and comfortable SKIN: skin color, texture, turgor are normal, no rashes or significant lesions EYES: normal, Conjunctiva are pink and non-injected, sclera clear OROPHARYNX:no exudate, no erythema and lips, buccal mucosa, and tongue normal  NECK: supple, thyroid normal size, non-tender, without nodularity LYMPH:  no palpable lymphadenopathy in the cervical, axillary or inguinal LUNGS: clear to auscultation and percussion with normal breathing effort HEART: regular rate & rhythm and no murmurs and no lower extremity edema ABDOMEN:abdomen soft, non-tender and normal bowel sounds MUSCULOSKELETAL:no cyanosis of digits and no clubbing  NEURO: alert & oriented x 3 with  fluent speech, no focal motor/sensory deficits EXTREMITIES: No lower extremity edema  LABORATORY DATA:  I have reviewed the data as listed   Chemistry      Component Value Date/Time   NA 137 12/20/2015 1120   K 3.7 12/20/2015 1120   CL 109 12/20/2015 1120   CO2 21 (L) 12/20/2015 1120   BUN 14 12/20/2015 1120   CREATININE 0.72 12/20/2015 1120   CREATININE 0.77 09/06/2013 1551      Component Value Date/Time   CALCIUM 8.8 (L) 12/20/2015 1120   ALKPHOS 65 12/20/2015 1120   AST 30 12/20/2015 1120   ALT 41 12/20/2015 1120   BILITOT 0.5 12/20/2015 1120       Lab Results  Component Value Date   WBC 6.9 12/20/2015   HGB 12.8 12/20/2015   HCT 37.3 12/20/2015   MCV 89.2 12/20/2015   PLT 477 (H) 12/20/2015   NEUTROABS 3.8 12/20/2015    ASSESSMENT & PLAN:  Breast cancer of lower-outer quadrant of right female breast ([place]) Rt Breast mass 07/18/15: MRI breast: 0.9 x 0.8 x 0.8 cm, with clumped NME 4 cm, 2 sites of  Bx proven flat epithelial atypia, T1bN0 (stage 1A) Rt Breast Biopsy 07/25/15: IDC with calcs and DCIS and ALH, Er 100%, PR 100%, Ki 67 5%, Her 2 Neg Ratio 1.35 Bilateral mastectomies 08/28/2015: In [place], 1.5 cm residual high-grade DCIS Tis N0 stage 0 Final pathologic staging: T1a N0 stage IA DX recurrence score 6, 5% risk of recurrence with tamoxifen  Current treatment: Tamoxifen 20 mg daily stopped due to pulmonary embolism.  Started letrozole 06/26/2016 Letrozole counseling:We discussed the risks and benefits of anti-estrogen therapy with aromatase inhibitors. These include but not limited to insomnia, hot flashes, mood changes, vaginal dryness, bone density loss, and weight gain. We strongly believe that the benefits far outweigh the risks. Patient understands these risks and consented to starting treatment. Planned treatment duration is 5 years.  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 (Patient to have oophorectomy in [place] and of  June) Pulmonary embolism: Most likely due to tamoxifen therapy. We stopped tamoxifen. Patient has pleural effusion most likely related to pulmonary embolism. I do not believe there is any need to do a thoracentesis. I sent a prescription for Xarelto.  PET CT scan: 12/06/2015: Right lobe of the liver lesion hypermetabolic on PET scan although it is relatively unchanged from July 2015. I will contact interventional radiology to obtain a liver biopsy.  General cancer surveillance: 1. Recommended colonoscopy every 5 years because of abdominal radiation 2. Patient plans to undergo hysterectomy with bilateral salpingo-oophorectomy (in June 2017). 3. No imaging studies required for the breast because she had bilateral mastectomies  Pulmonary embolism ([place]) Pulmonary embolism: I instructed her to stop Xarelto at this time. CT chest 02/29/2016: No acute or significant pulmonary embolus, previous small bilateral emboli have completely resolved.  Liver lesions: Liver MRI 06/25/16: Stable findings  Return to clinic in 3 months for follow-up on letrozole therapy   Orders Placed This Encounter  Procedures  . DG Bone Density    Standing Status:   Future    Standing Expiration Date:   06/26/2017    Order Specific Question:   Reason for Exam (SYMPTOM  OR DIAGNOSIS REQUIRED)    Answer:   Evaluation for osteoporosis on antiestrogen therapy    Order Specific Question:   Is the patient pregnant?    Answer:   No    Order Specific Question:   Preferred imaging location?    Answer:   [facility]   The patient has a good understanding of the overall plan. she agrees with it. she will call with any problems that may develop before the next visit here.   Rulon Eisenmenger, MD 06/26/16

## 2016-08-18 DIAGNOSIS — M546 Pain in thoracic spine: Secondary | ICD-10-CM | POA: Diagnosis not present

## 2016-08-18 DIAGNOSIS — M5413 Radiculopathy, cervicothoracic region: Secondary | ICD-10-CM | POA: Diagnosis not present

## 2016-08-18 DIAGNOSIS — M62830 Muscle spasm of back: Secondary | ICD-10-CM | POA: Diagnosis not present

## 2016-08-18 DIAGNOSIS — R51 Headache: Secondary | ICD-10-CM | POA: Diagnosis not present

## 2016-08-26 DIAGNOSIS — E559 Vitamin D deficiency, unspecified: Secondary | ICD-10-CM | POA: Diagnosis not present

## 2016-08-26 DIAGNOSIS — E038 Other specified hypothyroidism: Secondary | ICD-10-CM | POA: Diagnosis not present

## 2016-08-26 DIAGNOSIS — Z0000 Encounter for general adult medical examination without abnormal findings: Secondary | ICD-10-CM | POA: Diagnosis not present

## 2016-09-03 DIAGNOSIS — Z0000 Encounter for general adult medical examination without abnormal findings: Secondary | ICD-10-CM | POA: Diagnosis not present

## 2016-09-03 DIAGNOSIS — I2699 Other pulmonary embolism without acute cor pulmonale: Secondary | ICD-10-CM | POA: Diagnosis not present

## 2016-09-03 DIAGNOSIS — Z7901 Long term (current) use of anticoagulants: Secondary | ICD-10-CM | POA: Diagnosis not present

## 2016-09-03 DIAGNOSIS — F418 Other specified anxiety disorders: Secondary | ICD-10-CM | POA: Diagnosis not present

## 2016-09-03 DIAGNOSIS — C50919 Malignant neoplasm of unspecified site of unspecified female breast: Secondary | ICD-10-CM | POA: Diagnosis not present

## 2016-09-03 DIAGNOSIS — I447 Left bundle-branch block, unspecified: Secondary | ICD-10-CM | POA: Diagnosis not present

## 2016-09-24 ENCOUNTER — Ambulatory Visit: Payer: BLUE CROSS/BLUE SHIELD | Admitting: Hematology and Oncology

## 2016-09-24 NOTE — Assessment & Plan Note (Deleted)
Rt Breast mass 07/18/15: MRI breast: 0.9 x 0.8 x 0.8 cm, with clumped NME 4 cm, 2 sites of Bx proven flat epithelial atypia, T1bN0 (stage 1A) Rt Breast Biopsy 07/25/15: IDC with calcs and DCIS and ALH, Er 100%, PR 100%, Ki 67 5%, Her 2 Neg Ratio 1.35 Bilateral mastectomies 08/28/2015: In [place], 1.5 cm residual high-grade DCIS Tis N0 stage 0 Final pathologic staging: T1a N0 stage IA DX recurrence score 6, 5% risk of recurrence with tamoxifen  Current treatment: Tamoxifen 20 mg daily stopped due to pulmonary embolism. Started letrozole 06/26/2016 Letrozole toxicities:  BRIP-1: This increases her risk of breast and ovarian cancer. (Patient to have oophorectomy in [place] and of June)  General cancer surveillance: 1. Recommended colonoscopy every 5 years because of abdominal radiation 2. Patient plans to undergo hysterectomy with bilateral salpingo-oophorectomy (in June 2017). 3. No imaging studies required for the breast because she had bilateral mastectomies  Liver biopsy: Benign Pulmonary embolism: Of Xarelto CT 02/29/2016 showed no PE  Return to clinic in 6 months for follow-up

## 2016-12-25 ENCOUNTER — Encounter (HOSPITAL_COMMUNITY): Payer: Self-pay

## 2017-03-02 ENCOUNTER — Encounter: Payer: Self-pay | Admitting: Hematology and Oncology

## 2017-03-03 ENCOUNTER — Other Ambulatory Visit: Payer: Self-pay

## 2017-03-03 DIAGNOSIS — C50511 Malignant neoplasm of lower-outer quadrant of right female breast: Secondary | ICD-10-CM

## 2017-03-03 DIAGNOSIS — Z170 Estrogen receptor positive status [ER+]: Principal | ICD-10-CM

## 2017-03-05 ENCOUNTER — Other Ambulatory Visit: Payer: Self-pay

## 2017-03-05 ENCOUNTER — Telehealth: Payer: Self-pay

## 2017-03-05 DIAGNOSIS — C50511 Malignant neoplasm of lower-outer quadrant of right female breast: Secondary | ICD-10-CM

## 2017-03-05 DIAGNOSIS — C8173 Other classical Hodgkin lymphoma, intra-abdominal lymph nodes: Secondary | ICD-10-CM

## 2017-03-05 DIAGNOSIS — Z170 Estrogen receptor positive status [ER+]: Secondary | ICD-10-CM

## 2017-03-05 DIAGNOSIS — C801 Malignant (primary) neoplasm, unspecified: Secondary | ICD-10-CM

## 2017-03-05 NOTE — Telephone Encounter (Signed)
Called pt to provide her with information regarding scheduling her PET scan and MRI Breast. Pt thankful for the information and will call to schedule for follow up with Dr.Gudena.

## 2017-03-06 ENCOUNTER — Other Ambulatory Visit: Payer: Self-pay

## 2017-03-06 DIAGNOSIS — C50511 Malignant neoplasm of lower-outer quadrant of right female breast: Secondary | ICD-10-CM

## 2017-03-06 DIAGNOSIS — Z170 Estrogen receptor positive status [ER+]: Secondary | ICD-10-CM

## 2017-03-06 DIAGNOSIS — C73 Malignant neoplasm of thyroid gland: Secondary | ICD-10-CM

## 2017-03-10 ENCOUNTER — Other Ambulatory Visit: Payer: Self-pay

## 2017-03-10 ENCOUNTER — Encounter: Payer: Self-pay | Admitting: Hematology and Oncology

## 2017-03-10 DIAGNOSIS — M5413 Radiculopathy, cervicothoracic region: Secondary | ICD-10-CM | POA: Diagnosis not present

## 2017-03-10 DIAGNOSIS — C8173 Other classical Hodgkin lymphoma, intra-abdominal lymph nodes: Secondary | ICD-10-CM

## 2017-03-10 DIAGNOSIS — M546 Pain in thoracic spine: Secondary | ICD-10-CM | POA: Diagnosis not present

## 2017-03-10 DIAGNOSIS — M62830 Muscle spasm of back: Secondary | ICD-10-CM | POA: Diagnosis not present

## 2017-03-10 DIAGNOSIS — Z170 Estrogen receptor positive status [ER+]: Secondary | ICD-10-CM

## 2017-03-10 DIAGNOSIS — R51 Headache: Secondary | ICD-10-CM | POA: Diagnosis not present

## 2017-03-10 DIAGNOSIS — C50511 Malignant neoplasm of lower-outer quadrant of right female breast: Secondary | ICD-10-CM

## 2017-03-12 DIAGNOSIS — M5413 Radiculopathy, cervicothoracic region: Secondary | ICD-10-CM | POA: Diagnosis not present

## 2017-03-12 DIAGNOSIS — M546 Pain in thoracic spine: Secondary | ICD-10-CM | POA: Diagnosis not present

## 2017-03-12 DIAGNOSIS — M62830 Muscle spasm of back: Secondary | ICD-10-CM | POA: Diagnosis not present

## 2017-03-12 DIAGNOSIS — R51 Headache: Secondary | ICD-10-CM | POA: Diagnosis not present

## 2017-03-17 ENCOUNTER — Ambulatory Visit: Payer: BLUE CROSS/BLUE SHIELD

## 2017-03-17 ENCOUNTER — Ambulatory Visit (HOSPITAL_BASED_OUTPATIENT_CLINIC_OR_DEPARTMENT_OTHER): Payer: BLUE CROSS/BLUE SHIELD

## 2017-03-17 ENCOUNTER — Other Ambulatory Visit: Payer: Self-pay

## 2017-03-17 ENCOUNTER — Ambulatory Visit (HOSPITAL_COMMUNITY)
Admission: RE | Admit: 2017-03-17 | Discharge: 2017-03-17 | Disposition: A | Discharge status: Home / Self Care | Payer: BLUE CROSS/BLUE SHIELD | Source: Ambulatory Visit | Attending: Hematology and Oncology | Admitting: Hematology and Oncology

## 2017-03-17 DIAGNOSIS — Z170 Estrogen receptor positive status [ER+]: Principal | ICD-10-CM

## 2017-03-17 DIAGNOSIS — C50511 Malignant neoplasm of lower-outer quadrant of right female breast: Secondary | ICD-10-CM

## 2017-03-17 DIAGNOSIS — Z853 Personal history of malignant neoplasm of breast: Secondary | ICD-10-CM | POA: Diagnosis not present

## 2017-03-17 DIAGNOSIS — C50919 Malignant neoplasm of unspecified site of unspecified female breast: Secondary | ICD-10-CM | POA: Diagnosis not present

## 2017-03-17 DIAGNOSIS — C8173 Other classical Hodgkin lymphoma, intra-abdominal lymph nodes: Secondary | ICD-10-CM

## 2017-03-17 DIAGNOSIS — Z1231 Encounter for screening mammogram for malignant neoplasm of breast: Secondary | ICD-10-CM | POA: Diagnosis not present

## 2017-03-17 LAB — CBC WITH DIFFERENTIAL/PLATELET
BASO%: 1.2 % (ref 0.0–2.0)
Basophils Absolute: 0.1 10*3/uL (ref 0.0–0.1)
EOS%: 2.4 % (ref 0.0–7.0)
Eosinophils Absolute: 0.2 10*3/uL (ref 0.0–0.5)
HEMATOCRIT: 36.3 % (ref 34.8–46.6)
HGB: 12.3 g/dL (ref 11.6–15.9)
LYMPH%: 32.6 % (ref 14.0–49.7)
MCH: 30.8 pg (ref 25.1–34.0)
MCHC: 34 g/dL (ref 31.5–36.0)
MCV: 90.6 fL (ref 79.5–101.0)
MONO#: 0.7 10*3/uL (ref 0.1–0.9)
MONO%: 9.9 % (ref 0.0–14.0)
NEUT%: 53.9 % (ref 38.4–76.8)
NEUTROS ABS: 4.1 10*3/uL (ref 1.5–6.5)
PLATELETS: 394 10*3/uL (ref 145–400)
RBC: 4.01 10*6/uL (ref 3.70–5.45)
RDW: 13.6 % (ref 11.2–14.5)
WBC: 7.6 10*3/uL (ref 3.9–10.3)
lymph#: 2.5 10*3/uL (ref 0.9–3.3)

## 2017-03-17 LAB — COMPREHENSIVE METABOLIC PANEL
ALK PHOS: 92 U/L (ref 40–150)
ALT: 23 U/L (ref 0–55)
ANION GAP: 9 meq/L (ref 3–11)
AST: 20 U/L (ref 5–34)
Albumin: 3.8 g/dL (ref 3.5–5.0)
BUN: 15.9 mg/dL (ref 7.0–26.0)
CALCIUM: 9.7 mg/dL (ref 8.4–10.4)
CHLORIDE: 107 meq/L (ref 98–109)
CO2: 25 mEq/L (ref 22–29)
Creatinine: 0.8 mg/dL (ref 0.6–1.1)
Glucose: 91 mg/dl (ref 70–140)
POTASSIUM: 3.6 meq/L (ref 3.5–5.1)
Sodium: 141 mEq/L (ref 136–145)
Total Bilirubin: <0.22 mg/dL (ref 0.20–1.20)
Total Protein: 7.6 g/dL (ref 6.4–8.3)

## 2017-03-17 LAB — GLUCOSE, CAPILLARY: GLUCOSE-CAPILLARY: 81 mg/dL (ref 65–99)

## 2017-03-17 MED ORDER — GADOBENATE DIMEGLUMINE 529 MG/ML IV SOLN
20.0000 mL | Freq: Once | INTRAVENOUS | Status: AC | PRN
  Administered 2017-03-17: 15 mL via INTRAVENOUS

## 2017-03-17 MED ORDER — FLUDEOXYGLUCOSE F - 18 (FDG) INJECTION
7.2000 | Freq: Once | INTRAVENOUS | Status: AC | PRN
  Administered 2017-03-17: 7.2 via INTRAVENOUS

## 2017-03-18 ENCOUNTER — Telehealth: Payer: Self-pay | Admitting: Hematology and Oncology

## 2017-03-18 NOTE — Telephone Encounter (Signed)
Spoke with patient confirming their appt on 9/10,

## 2017-03-20 ENCOUNTER — Ambulatory Visit (HOSPITAL_COMMUNITY)
Admission: RE | Admit: 2017-03-20 | Discharge: 2017-03-20 | Disposition: A | Discharge status: Home / Self Care | Payer: BLUE CROSS/BLUE SHIELD | Source: Ambulatory Visit | Attending: Hematology and Oncology | Admitting: Hematology and Oncology

## 2017-03-20 DIAGNOSIS — K7689 Other specified diseases of liver: Secondary | ICD-10-CM | POA: Diagnosis not present

## 2017-03-20 DIAGNOSIS — C50511 Malignant neoplasm of lower-outer quadrant of right female breast: Secondary | ICD-10-CM | POA: Diagnosis not present

## 2017-03-20 DIAGNOSIS — K8020 Calculus of gallbladder without cholecystitis without obstruction: Secondary | ICD-10-CM | POA: Insufficient documentation

## 2017-03-20 DIAGNOSIS — Z170 Estrogen receptor positive status [ER+]: Secondary | ICD-10-CM | POA: Diagnosis not present

## 2017-03-20 DIAGNOSIS — C801 Malignant (primary) neoplasm, unspecified: Secondary | ICD-10-CM

## 2017-03-20 DIAGNOSIS — C8173 Other classical Hodgkin lymphoma, intra-abdominal lymph nodes: Secondary | ICD-10-CM

## 2017-03-20 MED ORDER — GADOBENATE DIMEGLUMINE 529 MG/ML IV SOLN
15.0000 mL | Freq: Once | INTRAVENOUS | Status: AC | PRN
  Administered 2017-03-20: 15 mL via INTRAVENOUS

## 2017-03-23 ENCOUNTER — Ambulatory Visit (HOSPITAL_BASED_OUTPATIENT_CLINIC_OR_DEPARTMENT_OTHER): Payer: BLUE CROSS/BLUE SHIELD | Admitting: Hematology and Oncology

## 2017-03-23 ENCOUNTER — Encounter: Payer: Self-pay | Admitting: Hematology and Oncology

## 2017-03-23 DIAGNOSIS — Z170 Estrogen receptor positive status [ER+]: Secondary | ICD-10-CM | POA: Diagnosis not present

## 2017-03-23 DIAGNOSIS — M791 Myalgia: Secondary | ICD-10-CM | POA: Diagnosis not present

## 2017-03-23 DIAGNOSIS — C50511 Malignant neoplasm of lower-outer quadrant of right female breast: Secondary | ICD-10-CM | POA: Diagnosis not present

## 2017-03-23 DIAGNOSIS — M2550 Pain in unspecified joint: Secondary | ICD-10-CM

## 2017-03-23 DIAGNOSIS — R6882 Decreased libido: Secondary | ICD-10-CM

## 2017-03-23 DIAGNOSIS — N951 Menopausal and female climacteric states: Secondary | ICD-10-CM

## 2017-03-23 MED ORDER — ANASTROZOLE 1 MG PO TABS: 1.0000 mg | ORAL_TABLET | Freq: Every day | ORAL | 0 refills | Status: DC

## 2017-03-23 MED ORDER — BIOTIN 2500 MCG PO CAPS: 5000.0000 ug | ORAL_CAPSULE | Freq: Every day | ORAL | Status: DC

## 2017-03-23 MED ORDER — BUPROPION HCL ER (XL) 300 MG PO TB24: 300.0000 mg | ORAL_TABLET | Freq: Every day | ORAL | 6 refills | Status: DC

## 2017-03-23 NOTE — Progress Notes (Signed)
Patient Care Team: Tisovec, [name], MD as PCP - General (Internal Medicine) [name], NP as Nurse Practitioner (Obstetrics and Gynecology) [name], MD as Consulting Physician (Sports Medicine) [name], MD as Consulting Physician (Hematology and Oncology) Jovita Kussmaul, MD as Consulting Physician (General Surgery) [name], NP as Nurse Practitioner (Hematology and Oncology) [name], MD as Consulting Physician (Obstetrics and Gynecology)  DIAGNOSIS:  Encounter Diagnosis  Name Primary?  . Malignant neoplasm of lower-outer quadrant of right breast of female, estrogen receptor positive ([place])     SUMMARY OF ONCOLOGIC HISTORY:   Hodgkin's lymphoma ([place])   06/21/1979 Initial Diagnosis    Stage III Hodgkin's disease with cervical, retroperitoneal lymph nodes, treated with ABVD-MOPP (alternating) followed by chest and abdominal radiation at [facility] with complete response      09/23/2001 Pathology Results    Melanoma stage I on the back treated with resection      09/27/2002 Pathology Results    Thyroid cancer treated with thyroidectomy       Breast cancer of lower-outer quadrant of right female breast ([place])   07/18/2015 Breast MRI    Rt breast mass 0.9 x 0.8 x 0.8 cm, with clumped NME 4 cm, 2 sites of Bx proven flat epithelial atypia      07/25/2015 Initial Diagnosis    Rt Breast Biopsy: IDC with calcs and DCIS and ALH, Er 100%, PR 100%, Ki 67 5%, Her 2 Neg Ratio 1.35      07/25/2015 Clinical Stage    Stage IA: T1b N0      07/25/2015 Oncotype testing    RS 6 (5% ROR)      07/26/2015 Procedure    Genetic testing revealed BRIP-1      08/28/2015 Surgery    Bilateral mastectomies with reconstruction in [place] at [place] for restorative surgery (Dr.Delacroce): Residual high-grade DCIS 1.5 cm, 0/2 sentinel nodes      09/24/2015 - 11/29/2015 Anti-estrogen oral therapy    Tamoxifen 20 mg daily. Stopped May 2017 for  pulmonary embolism      10/31/2015 Survivorship    Care plan completed and mailed to patient in lieu of in person visit at her request      11/20/2015 - 11/23/2015 Hospital Admission    Hospitalization for pulmonary embolism      05/15/2016 Surgery    Hysterectomy and BSO      07/23/2016 -  Anti-estrogen oral therapy    Letrozole 2.5 mg daily      03/17/2017 PET scan    No evidence of hypermetabolic recurrent or metastatic disease. Decrease in left hepatic lobe hypermetabolism, corresponding to a benign abnormality on prior MRI ; likely focal fat; Resolved left-sided pleural effusion       CHIEF COMPLIANT: Follow-up to review scans  INTERVAL HISTORY: Stacy Chung is a 46 year old with above-mentioned history of lymphoma and a right breast cancer who underwent recent PET scans along with liver MRI for evaluation of the liver mass along with the breast MRI. She has a genetic mutation that disposes her to high risk of recurrence. She is here today to review the results of all the scans. Her biggest complaints of related to letrozole therapy. She has 5-6 hot flashes that wake her up at night. She also has diffuse myalgias arthralgias especially when she sits for any length of time. Is also complaining of lack of sex drive. She is currently on Wellbutrin.  REVIEW OF SYSTEMS:   Constitutional: Denies  fevers, chills or abnormal weight loss Eyes: Denies blurriness of vision Ears, nose, mouth, throat, and face: Denies mucositis or sore throat Respiratory: Denies cough, dyspnea or wheezes Cardiovascular: Denies palpitation, chest discomfort Gastrointestinal:  Denies nausea, heartburn or change in bowel habits Skin: Denies abnormal skin rashes Lymphatics: Denies new lymphadenopathy or easy bruising Neurological:Denies numbness, tingling or new weaknesses Behavioral/Psych: Mood is stable, no new changes  Extremities: No lower extremity edema Breast:  denies any pain or lumps or nodules in  either breasts All other systems were reviewed with the patient and are negative.  I have reviewed the past medical history, past surgical history, social history and family history with the patient and they are unchanged from previous note.  ALLERGIES:  is allergic to other; macrodantin; pertussis vaccines; and tape.  MEDICATIONS:  Current Outpatient Prescriptions  Medication Sig Dispense Refill  . anastrozole (ARIMIDEX) 1 MG tablet Take 1 tablet (1 mg total) by mouth daily. 30 tablet 0  . Biotin 2500 MCG CAPS Take 5,000 mcg by mouth daily. 30 capsule   . buPROPion (WELLBUTRIN XL) 300 MG 24 hr tablet Take 1 tablet (300 mg total) by mouth daily. 30 tablet 6  . cetirizine (ZYRTEC) 5 MG tablet Take 1 tablet (5 mg total) by mouth daily. 30 tablet 3  . Cholecalciferol (VITAMIN D3) 10000 UNITS capsule Take 10,000 Units by mouth daily. Only 5 days a week     . esomeprazole (NEXIUM) 40 MG capsule Take 1 capsule (40 mg total) by mouth daily at 12 noon.    . fexofenadine-pseudoephedrine (ALLEGRA-D 24) 180-240 MG 24 hr tablet Take 1 tablet by mouth daily.    Marland Kitchen levothyroxine (SYNTHROID, LEVOTHROID) 75 MCG tablet Take 75 mcg by mouth daily.      Marland Kitchen ZOLMitriptan (ZOMIG) 2.5 MG tablet Take 2.5 mg by mouth as needed.       No current facility-administered medications for this visit.     PHYSICAL EXAMINATION: ECOG PERFORMANCE STATUS: 1 - Symptomatic but completely ambulatory  Vitals:   03/23/17 0854  BP: (!) 107/51  Pulse: 81  Resp: 18  Temp: 98.2 F (36.8 C)  SpO2: 100%   Filed Weights   03/23/17 0854  Weight: 167 lb 11.2 oz (76.1 kg)    GENERAL:alert, no distress and comfortable SKIN: skin color, texture, turgor are normal, no rashes or significant lesions EYES: normal, Conjunctiva are pink and non-injected, sclera clear OROPHARYNX:no exudate, no erythema and lips, buccal mucosa, and tongue normal  NECK: supple, thyroid normal size, non-tender, without nodularity LYMPH:  no palpable  lymphadenopathy in the cervical, axillary or inguinal LUNGS: clear to auscultation and percussion with normal breathing effort HEART: regular rate & rhythm and no murmurs and no lower extremity edema ABDOMEN:abdomen soft, non-tender and normal bowel sounds MUSCULOSKELETAL:no cyanosis of digits and no clubbing  NEURO: alert & oriented x 3 with fluent speech, no focal motor/sensory deficits EXTREMITIES: No lower extremity edema BREAST: No palpable masses or nodules in either right or left breasts. No palpable axillary supraclavicular or infraclavicular adenopathy no breast tenderness or nipple discharge. (exam performed in the presence of a chaperone)  LABORATORY DATA:  I have reviewed the data as listed   Chemistry      Component Value Date/Time   NA 141 03/17/2017 1225   K 3.6 03/17/2017 1225   CL 109 12/20/2015 1120   CO2 25 03/17/2017 1225   BUN 15.9 03/17/2017 1225   CREATININE 0.8 03/17/2017 1225      Component Value Date/Time  CALCIUM 9.7 03/17/2017 1225   ALKPHOS 92 03/17/2017 1225   AST 20 03/17/2017 1225   ALT 23 03/17/2017 1225   BILITOT <0.22 03/17/2017 1225       Lab Results  Component Value Date   WBC 7.6 03/17/2017   HGB 12.3 03/17/2017   HCT 36.3 03/17/2017   MCV 90.6 03/17/2017   PLT 394 03/17/2017   NEUTROABS 4.1 03/17/2017    ASSESSMENT & PLAN:  Breast cancer of lower-outer quadrant of right female breast ([place]) Rt Breast mass 07/18/15: MRI breast: 0.9 x 0.8 x 0.8 cm, with clumped NME 4 cm, 2 sites of Bx proven flat epithelial atypia, T1bN0 (stage 1A) Rt Breast Biopsy 07/25/15: IDC with calcs and DCIS and ALH, Er 100%, PR 100%, Ki 67 5%, Her 2 Neg Ratio 1.35 Bilateral mastectomies 08/28/2015: In [place], 1.5 cm residual high-grade DCIS Tis N0 stage 0 Final pathologic staging: T1a N0 stage IA DX recurrence score 6, 5% risk of recurrence with tamoxifen Liver biopsy: 12/20/15: Benign  Current treatment: Tamoxifen 20 mg daily stopped due to pulmonary  embolism. Started letrozole 06/26/2016 Switched to anastrozole 03/24/2007  Letrozole Toxicities: Severe hot flashes and severe arthralgias and myalgias. Because of this we decided to change her treatment to anastrozole. I sent her a month prescription. She tolerates it well then we can give her 90 day prescription. She will stop it for 2 weeks and then start the anastrozole. We also discussed about the pros and cons of taking gabapentin for the hot flashes. At this time we will see how she tolerates anastrozole before deciding on that.  Decreased sex drive: We discussed different options. She is currently on Wellbutrin 150 mg. We discussed about increasing it to 300 mg  BRIP-1: This increases her risk of breast and ovarian cancer. (Patient to have oophorectomy in [place] and of June)  MRI Abd 03/20/17: Stable benign focal nodular hyperplasia in the right hepatic lobe. No evidence of metastatic disease or other acute findings. MRI Breast 03/17/17: No MR evidence of breast malignancy PET 9/4/18No evidence of hypermetabolic recurrent or metastatic disease. Decrease in left hepatic lobe hypermetabolism, corresponding to a benign abnormality on prior MRI ; likely focal fat; Resolved left-sided pleural effusion   I spent 25 minutes talking to the patient of which more than half was spent in counseling and coordination of care.  No orders of the defined types were placed in this encounter.  The patient has a good understanding of the overall plan. she agrees with it. she will call with any problems that may develop before the next visit here.   Rulon Eisenmenger, MD 03/23/17

## 2017-03-23 NOTE — Assessment & Plan Note (Signed)
Rt Breast mass 07/18/15: MRI breast: 0.9 x 0.8 x 0.8 cm, with clumped NME 4 cm, 2 sites of Bx proven flat epithelial atypia, T1bN0 (stage 1A) Rt Breast Biopsy 07/25/15: IDC with calcs and DCIS and ALH, Er 100%, PR 100%, Ki 67 5%, Her 2 Neg Ratio 1.35 Bilateral mastectomies 08/28/2015: In [place], 1.5 cm residual high-grade DCIS Tis N0 stage 0 Final pathologic staging: T1a N0 stage IA DX recurrence score 6, 5% risk of recurrence with tamoxifen Liver biopsy: 12/20/15: Benign  Current treatment: Tamoxifen 20 mg daily stopped due to pulmonary embolism. Started letrozole 06/26/2016  Letrozole Toxicities:  BRIP-1: This increases her risk of breast and ovarian cancer. (Patient to have oophorectomy in [place] and of June)  MRI Abd 03/20/17: Stable benign focal nodular hyperplasia in the right hepatic lobe. No evidence of metastatic disease or other acute findings. MRI Breast 03/17/17: No MR evidence of breast malignancy PET 9/4/18No evidence of hypermetabolic recurrent or metastatic disease. Decrease in left hepatic lobe hypermetabolism, corresponding to a benign abnormality on prior MRI ; likely focal fat; Resolved left-sided pleural effusion

## 2017-03-24 ENCOUNTER — Ambulatory Visit: Payer: BLUE CROSS/BLUE SHIELD | Admitting: Hematology and Oncology

## 2017-03-24 ENCOUNTER — Other Ambulatory Visit: Payer: BLUE CROSS/BLUE SHIELD | Admitting: Hematology and Oncology

## 2017-04-10 ENCOUNTER — Ambulatory Visit (HOSPITAL_COMMUNITY)
Admission: RE | Admit: 2017-04-10 | Discharge: 2017-04-10 | Disposition: A | Discharge status: Home / Self Care | Payer: BLUE CROSS/BLUE SHIELD | Source: Ambulatory Visit | Attending: Hematology and Oncology | Admitting: Hematology and Oncology

## 2017-04-10 ENCOUNTER — Ambulatory Visit (HOSPITAL_BASED_OUTPATIENT_CLINIC_OR_DEPARTMENT_OTHER)
Admission: RE | Admit: 2017-04-10 | Discharge: 2017-04-10 | Disposition: A | Discharge status: Home / Self Care | Payer: BLUE CROSS/BLUE SHIELD | Source: Ambulatory Visit | Attending: Hematology and Oncology | Admitting: Hematology and Oncology

## 2017-04-10 ENCOUNTER — Telehealth: Payer: Self-pay

## 2017-04-10 DIAGNOSIS — Z86718 Personal history of other venous thrombosis and embolism: Secondary | ICD-10-CM | POA: Diagnosis not present

## 2017-04-10 DIAGNOSIS — C50511 Malignant neoplasm of lower-outer quadrant of right female breast: Secondary | ICD-10-CM | POA: Insufficient documentation

## 2017-04-10 DIAGNOSIS — M79609 Pain in unspecified limb: Secondary | ICD-10-CM | POA: Diagnosis not present

## 2017-04-10 NOTE — Progress Notes (Signed)
**  Preliminary report by tech**  Right upper extremity venous duplex complete. There is no evidence of deep or superficial vein thrombosis involving the right upper extremity. All visualized vessels appear patent and compressible.  Right lower extremity venous duplex complete. There is no evidence of deep or superficial vein thrombosis involving the right lower extremity. All visualized vessels appear patent and compressible. There is no evidence of a Baker's cyst on the right. Results were given to Dr. Lindi Adie.  04/10/17 3:34 PM Stacy Chung RVT

## 2017-04-10 NOTE — Telephone Encounter (Signed)
Pt called with possible sx of blood clot. R forearm is slighty swollen, very tender to the touch, does not remember hitting it at all. A little redness. She did pick up a heavy suitcase. 1/2 to 3/4 inch swath, about 2 inches long that cuts across the arm at an angle.  R leg is crampy calf and lower 1/4 of thigh. Especially behind knee. No swelling seen. Feels tight. Not very tender.  The leg started late last night and the arm this morning. Flew in from [place] 4 hr flight, last night. Not felt on the flight but after got home.    S/W Dr Lindi Adie and pt needs [place] of arm and leg. S/w [place] and set up for 3 pm at [facility]. Called pt back with time and place.

## 2017-04-13 ENCOUNTER — Telehealth: Payer: Self-pay

## 2017-04-13 NOTE — Telephone Encounter (Signed)
Called pt and lvm with call back number to notify her that her BLE ultrasound were negative for DVT. Also wanted to follow up on her symptoms, if there is anything else she needs.

## 2017-04-27 DIAGNOSIS — Z6828 Body mass index (BMI) 28.0-28.9, adult: Secondary | ICD-10-CM | POA: Diagnosis not present

## 2017-04-27 DIAGNOSIS — Z01419 Encounter for gynecological examination (general) (routine) without abnormal findings: Secondary | ICD-10-CM | POA: Diagnosis not present

## 2017-05-14 DIAGNOSIS — Z23 Encounter for immunization: Secondary | ICD-10-CM | POA: Diagnosis not present

## 2017-05-21 ENCOUNTER — Other Ambulatory Visit: Payer: Self-pay | Admitting: Hematology and Oncology

## 2017-05-25 DIAGNOSIS — M5413 Radiculopathy, cervicothoracic region: Secondary | ICD-10-CM | POA: Diagnosis not present

## 2017-05-25 DIAGNOSIS — M62830 Muscle spasm of back: Secondary | ICD-10-CM | POA: Diagnosis not present

## 2017-05-25 DIAGNOSIS — R51 Headache: Secondary | ICD-10-CM | POA: Diagnosis not present

## 2017-05-25 DIAGNOSIS — M546 Pain in thoracic spine: Secondary | ICD-10-CM | POA: Diagnosis not present

## 2017-07-28 ENCOUNTER — Ambulatory Visit (INDEPENDENT_AMBULATORY_CARE_PROVIDER_SITE_OTHER): Payer: BLUE CROSS/BLUE SHIELD

## 2017-07-28 ENCOUNTER — Ambulatory Visit (INDEPENDENT_AMBULATORY_CARE_PROVIDER_SITE_OTHER): Payer: BLUE CROSS/BLUE SHIELD | Admitting: Sports Medicine

## 2017-07-28 ENCOUNTER — Encounter: Payer: Self-pay | Admitting: Sports Medicine

## 2017-07-28 DIAGNOSIS — M75101 Unspecified rotator cuff tear or rupture of right shoulder, not specified as traumatic: Secondary | ICD-10-CM

## 2017-07-28 DIAGNOSIS — M75102 Unspecified rotator cuff tear or rupture of left shoulder, not specified as traumatic: Secondary | ICD-10-CM

## 2017-07-28 DIAGNOSIS — M25511 Pain in right shoulder: Secondary | ICD-10-CM

## 2017-07-28 DIAGNOSIS — M25512 Pain in left shoulder: Secondary | ICD-10-CM | POA: Diagnosis not present

## 2017-07-28 NOTE — Assessment & Plan Note (Signed)
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 Right side is worse than left, subcoracoid impingement so we did a subcoracoid injection. Adding formal physical therapy, continue ibuprofen 800 mg 3 times per day. Return in 1 month. Bilateral x-rays.

## 2017-07-28 NOTE — Progress Notes (Signed)
Subjective:    I'm seeing this patient as a consultation for: Dr. 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  She does have a history of non-Hodgkin's lymphoma as well as melanoma.  Unfortunately she has developed pain over the past several months over both shoulders, deltoids, worse with overhead activities.  She did recently have a PET scan as a follow-up for her breast cancer that did not show increased activity over the shoulders.  Pain is moderate, persistent, localized without radiation.  I reviewed the past medical history, family history, social history, surgical history, and allergies today and no changes were needed.  Please see the problem list section below in epic for further details.  Past Medical History: Past Medical History:  Diagnosis Date  . Allergy   . Anxiety   . Breast cancer ([place])   . 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 Cancer ([place]) 1986   Hodgkins lymphoma  . Carcinoma of thyroid ([place])   . Depression   . Family history of breast cancer   . Family history of colon cancer   . Family history of ovarian cancer   . Family history of prostate cancer   . GERD (gastroesophageal reflux disease)   . History of chicken pox   . Hodgkin's disease   . Hx: UTI (urinary tract infection)   . Increased frequency of headaches   . Left breast mass   . Migraines   . PONV (postoperative nausea and vomiting)   . Pulmonary embolism ([place]) 11/30/2015  . Skin cancer 2003   Melanoma  . Thyroid disease    Past Surgical History: Past Surgical History:  Procedure Laterality Date  . FOOT TENDON SURGERY Left   . LAPAROSCOPIC SPLENECTOMY  1987   Hodgkins Disease  . THORACOTOMY  1991   collasped lung, spontaneous  . THYROIDECTOMY  2004   nodules,  carcinoma  . URETEROPLASTY  1977   malformed ureter   Social History: Social History   Socioeconomic History  . Marital status: Married    Spouse name: None  . Number of children: 2  . Years of education: 45  . Highest education level: None  Social Needs  . Financial resource strain: None  . Food insecurity - worry: None  . Food insecurity - inability: None  . Transportation needs - medical: None  . Transportation needs - non-medical: None  Occupational History  . Occupation: Chief Strategy Officer  Tobacco Use  . Smoking status: Never Smoker  . Smokeless tobacco: Never Used  Substance and Sexual Activity  . Alcohol use: Yes    Comment: social  . Drug use: No  . Sexual activity: Yes    Birth control/protection: IUD  Other Topics Concern  . None  Social History Narrative   Regular exercise-no   Family History: Family History  Problem Relation Age of Onset  . Alcohol abuse Maternal Aunt   . Prostate cancer Maternal Uncle 14  . Colon cancer Maternal Grandmother        dx in her 16s  . Prostate cancer Maternal Grandfather        dx in his 69s  . Cancer Other        FH of Breast Cancer, Ovarian/Uterine Cancer  . Heart disease Maternal Uncle  4s  . Breast cancer Other        MGFs sister - reportedly BRCA+  . Ovarian cancer Other        PGFs mother  . Ovarian cancer Other        PGFs maternal aunt  . Ovarian cancer Other        PGFs maternal grandmohter  . Breast cancer Other        MGMs sister  . Breast cancer Other        MGFs mother   Allergies: Allergies  Allergen Reactions  . Other Rash    "bandaides"  . Macrodantin   . Pertussis Vaccines   . Tape Rash    "plastic tape"   Medications: See med rec.  Review of Systems: No headache, visual changes, nausea, vomiting, diarrhea, constipation, dizziness, abdominal pain, skin rash, fevers, chills, night sweats, weight loss, swollen lymph nodes, body aches, joint swelling, muscle aches, chest pain,  shortness of breath, mood changes, visual or auditory hallucinations.   Objective:   General: Well Developed, well nourished, and in no acute distress.  Neuro:  Extra-ocular muscles intact, able to move all 4 extremities, sensation grossly intact.  Deep tendon reflexes tested were normal. Psych: Alert and oriented, mood congruent with affect. ENT:  Ears and nose appear unremarkable.  Hearing grossly normal. Neck: Unremarkable overall appearance, trachea midline.  No visible thyroid enlargement. Eyes: Conjunctivae and lids appear unremarkable.  Pupils equal and round. Skin: Warm and dry, no rashes noted.  Cardiovascular: Pulses palpable, no extremity edema. Bilateral shoulders: Inspection reveals no abnormalities, atrophy or asymmetry. Palpation is normal with no tenderness over AC joint or bicipital groove. ROM is full in all planes. Rotator cuff strength weak to internal rotation particularly on the right Positive Neer and Hawkin's tests, empty can. Speeds and Yergason's tests mildly positive. No labral pathology noted with negative Obrien's, negative crank, negative clunk, and good stability. Normal scapular function observed. No painful arc and no drop arm sign. No apprehension sign  Procedure: Real-time Ultrasound Guided Injection of right biceps tendon sheath Device: GE Logiq E  Verbal informed consent obtained.  Time-out conducted.  Noted no overlying erythema, induration, or other signs of local infection.  Skin prepped in a sterile fashion.  Local anesthesia: Topical Ethyl chloride.  With sterile technique and under real time ultrasound guidance: I advanced a 25-gauge needle into the biceps tendon sheath, noted subluxation of the biceps tendon out of the groove, I injected some medication. Completed without difficulty  Pain immediately resolved suggesting accurate placement of the medication.  Advised to call if fevers/chills, erythema, induration, drainage, or persistent  bleeding.  Images permanently stored and available for review in the ultrasound unit.  Impression: Technically successful ultrasound guided injection.  Procedure: Real-time Ultrasound Guided Injection of right subcoracoid space Device: GE Logiq E  Verbal informed consent obtained.  Time-out conducted.  Noted no overlying erythema, induration, or other signs of local infection.  Skin prepped in a sterile fashion.  Local anesthesia: Topical Ethyl chloride.  With sterile technique and under real time ultrasound guidance: After completing the previous/above injection into the biceps tendon sheath I redirected the needle under the coracoid process but skimming over the subscapularis tendon, and injected the rest of the medication for a total of 1 cc kenalog 40, 1 cc lidocaine, 1 cc bupivacaine. Completed without difficulty  Pain immediately resolved suggesting accurate placement of the medication.  Advised to call if fevers/chills, erythema, induration, drainage, or persistent bleeding.  Images permanently stored and available for review in the ultrasound unit.  Impression: Technically successful ultrasound guided injection.  Impression and Recommendations:   This case required medical decision making of moderate complexity.  Rotator cuff syndrome of both shoulders Suspect bilateral subscapularis dysfunction. Right side is worse than left, subcoracoid impingement so we did a subcoracoid injection. Adding formal physical therapy, continue ibuprofen 800 mg 3 times per day. Return in 1 month. Bilateral x-rays.  ___________________________________________ Gwen Her. Dianah Field, M.D., ABFM., CAQSM. Primary Care and [place] Instructor of [place] [employer] [facility] of [facility]

## 2017-08-17 ENCOUNTER — Encounter: Payer: Self-pay | Admitting: Physical Therapy

## 2017-08-17 ENCOUNTER — Ambulatory Visit: Payer: BLUE CROSS/BLUE SHIELD | Attending: Sports Medicine | Admitting: Physical Therapy

## 2017-08-17 ENCOUNTER — Other Ambulatory Visit: Payer: Self-pay

## 2017-08-17 DIAGNOSIS — R293 Abnormal posture: Secondary | ICD-10-CM | POA: Diagnosis not present

## 2017-08-17 DIAGNOSIS — M25511 Pain in right shoulder: Secondary | ICD-10-CM | POA: Diagnosis not present

## 2017-08-17 DIAGNOSIS — M25611 Stiffness of right shoulder, not elsewhere classified: Secondary | ICD-10-CM | POA: Diagnosis not present

## 2017-08-17 DIAGNOSIS — M25612 Stiffness of left shoulder, not elsewhere classified: Secondary | ICD-10-CM | POA: Diagnosis not present

## 2017-08-17 DIAGNOSIS — G8929 Other chronic pain: Secondary | ICD-10-CM | POA: Diagnosis not present

## 2017-08-17 DIAGNOSIS — M25512 Pain in left shoulder: Secondary | ICD-10-CM | POA: Diagnosis not present

## 2017-08-17 NOTE — Therapy (Signed)
[facility] [address]  [place], [place], [address] Phone: [phone]   Fax:  [phone]  Physical Therapy Evaluation  Patient Details  Name: Stacy Chung MRN: [id number] Date of Birth: 1971/05/19 Referring Provider: Aundria Mems, MD   Encounter Date: 08/17/2017  PT End of Session - 08/17/17 1706    Visit Number  1    Number of Visits  12    Date for PT Re-Evaluation  10/02/17    Authorization Type  BCBS - [place]: [place];  Billing codes not covered: 54008 (Self-care) & 67619 (PPT)    PT Start Time  1620    PT Stop Time  1706    PT Time Calculation (min)  46 min    Activity Tolerance  Patient tolerated treatment well    Behavior During Therapy  [facility] for tasks assessed/performed       Past Medical History:  Diagnosis Date  . Allergy   . Anxiety   . Breast cancer ([place])   . 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 Cancer ([place]) 1986   Hodgkins lymphoma  . Carcinoma of thyroid ([place])   . Depression   . Family history of breast cancer   . Family history of colon cancer   . Family history of ovarian cancer   . Family history of prostate cancer   . GERD (gastroesophageal reflux disease)   . History of chicken pox   . Hodgkin's disease   . Hx: UTI (urinary tract infection)   . Increased frequency of headaches   . Left breast mass   . Migraines   . PONV (postoperative nausea and vomiting)   . Pulmonary embolism ([place]) 11/30/2015  . Skin cancer 2003   Melanoma  . Thyroid disease     Past Surgical History:  Procedure Laterality Date  . FOOT TENDON SURGERY Left   . LAPAROSCOPIC SPLENECTOMY  1987   Hodgkins Disease  . THORACOTOMY  1991   collasped lung, spontaneous  . THYROIDECTOMY  2004   nodules, carcinoma  . URETEROPLASTY  1977   malformed ureter    There were no vitals filed for this visit.   Subjective Assessment - 08/17/17 1624    Subjective  Pt reports she started having problems  with her shoulders in late 2016. In the meantime she was diagnosed with R breast cancer in early 2017 and has been dealing with breast cancer treatments, including B mastectomy with R lymph node resection and B reconstructions, PE in 11/2015, and hysterectomy in 05/2016. B shoulder pain began to worsen around Christmastime 2018. Received injections in MD office ~3 weeks ago with some relief, but now starting to wear off.    Pertinent History  B mastectomies with reconstruction (+ R lymph node resection) s/p R breast cancer 08/2015    Patient Stated Goals  "To be able to use my arms normally"    Currently in Pain?  Yes    Pain Score  3     Pain Location  Shoulder    Pain Orientation  Right    Pain Descriptors / Indicators  Heaviness "drilling through front of shoulder"    Pain Type  Acute pain;Chronic pain    Pain Onset  More than a month ago    Pain Frequency  Constant    Aggravating Factors   overhead activities, repetitive motions, certain motions/positions    Pain Relieving Factors  rest, Advil - 800 mg  Effect of Pain on Daily Activities  avoids overhead activites such as drying hair, has to use L arm more than R    Multiple Pain Sites  Yes    Pain Score  -- 1.5/10    Pain Location  Shoulder    Pain Orientation  Left    Pain Type  Acute pain;Chronic pain    Pain Onset  More than a month ago    Pain Frequency  Constant    Aggravating Factors   overhead activities, repetitive motions, certain motions/positions    Pain Relieving Factors  rest, Advil - 800 mg    Effect of Pain on Daily Activities  avoids overhead activites such as drying hair, has to use L arm more than R         [facility] PT Assessment - 08/17/17 1620      Assessment   Medical Diagnosis  B RTC syndrome    Referring Provider  Aundria Mems, MD    Onset Date/Surgical Date  -- late 2016    Hand Dominance  Right    Next MD Visit  09/08/17    Prior Therapy  none for current condition      Balance Screen   Has the  patient fallen in the past 6 months  No near miss - caught herself with R arm    Has the patient had a decrease in activity level because of a fear of falling?   No    Is the patient reluctant to leave their home because of a fear of falling?   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, lifting cooler in/out car    Leisure  play with grandchildren (46 y/o & 9 m/o & 63 y/o))      Cognition   Overall Cognitive Status  Within Functional Limits for tasks assessed      Observation/Other Assessments   Focus on Therapeutic Outcomes (FOTO)   Shoulder - 55% (45% limitation); predicted 66% (34% limitation)      Posture/Postural Control   Posture/Postural Control  Postural limitations    Postural Limitations  Forward head;Rounded Shoulders    Posture Comments  mildly protracted scapula with slight L scapular winging      ROM / Strength   AROM / PROM / Strength  AROM;Strength      AROM   AROM Assessment Site  Shoulder    Right/Left Shoulder  Right;Left    Right Shoulder Flexion  134 Degrees    Right Shoulder ABduction  129 Degrees    Right Shoulder Internal Rotation  -- FIR to T9    Right Shoulder External Rotation  -- FER to T3    Left Shoulder Flexion  132 Degrees    Left Shoulder ABduction  122 Degrees    Left Shoulder Internal Rotation  -- FIR to T8    Left Shoulder External Rotation  -- FER to T2      Strength   Overall Strength Comments  muscle grades within available ROM    Strength Assessment Site  Shoulder    Right/Left Shoulder  Right;Left    Right Shoulder Flexion  4-/5 pain with resistance    Right Shoulder ABduction  4-/5  pain with resistance    Right Shoulder Internal Rotation  4/5    Right Shoulder External Rotation  4-/5    Left Shoulder Flexion   4-/5    Left Shoulder Extension  4-/5    Left Shoulder Internal Rotation  4/5    Left Shoulder External Rotation  4-/5      Palpation   Palpation comment  increased muscle tension/tightness in B UT & pecs (R > L) with ttp on R             Objective measurements completed on examination: See above findings.      [place] Adult PT Treatment/Exercise - 08/17/17 1620      Self-Care   Self-Care  Posture    Posture  Education provided for neutral spine and shoulder posture      Exercises   Exercises  Shoulder      Shoulder Exercises: Seated   Retraction  Both;12 reps 5" hold    Retraction Limitations  + scapular depression      Shoulder Exercises: Standing   Extension  Both;10 reps;Theraband;Strengthening    Theraband Level (Shoulder Extension)  Level 1 (Yellow)    Row  Both;10 reps;Theraband;Strengthening    Theraband Level (Shoulder Row)  Level 1 (Yellow)      Shoulder Exercises: Stretch   Corner Stretch  30 seconds;1 rep each    Corner Stretch Limitations  3 way doorway stretch - upper position deferred from HEP d/t increased pain             PT Education - 08/17/17 1706    Education provided  Yes    Education Details  PT eval findings, anticipated POC & initial HEP    Person(s) Educated  Patient    Methods  Explanation;Demonstration;Handout    Comprehension  Verbalized understanding;Returned demonstration;Need further instruction       PT Short Term Goals - 08/17/17 1706      PT SHORT TERM GOAL #1   Title  Independent with initial HEP    Status  New    Target Date  08/31/17      PT SHORT TERM GOAL #2   Title  Pt will verbalize/demonstrate understanding of neutral spine and shoulder posture to promote improved glenohumeral & scapular kinematics    Status  New    Target Date  09/07/17        PT Long Term Goals - 08/17/17 1706      PT LONG TERM GOAL #1   Title  Independent with ongoing/advanced HEP    Status  New    Target Date  10/02/17       PT LONG TERM GOAL #2   Title  B shoulder AROM WFL w/o increased pain     Status  New    Target Date  10/02/17      PT LONG TERM GOAL #3   Title  B shoulder strength >/= 4+/5 w/o pain on resistance    Status  New    Target Date  10/02/17      PT LONG TERM GOAL #4   Title  Pt will report ability to complete sustained overhead activity for >/= 7 minutes w/o residual pain or fatigue to allow her to dry her hair with a hair dryer    Status  New    Target Date  10/02/17             Plan - 08/17/17 1706    Clinical Impression Statement  Stacy Chung  is a 47 y/o female who presents to OP PT for B rotator cuff syndrome, R > L. Pt reports onset of shoulder pain in late 2016, but evaluation and treatment for shoulders delayed by diagnosis of breast cancer in early 2017 and subsequent care and comorbidities related to this. B shoulder pain mild at time of eval following injections in MD office ~3 weeks ago, however pt reports benefit from injections seems to be wearing off and shoulder pain increases with sustained overhead activities and results in lasting fatigue and limited use of UEs following such activities. Pt demonstrates postural abnormalities resulting in increased anterior and upper shoulder complex muscle tightness with decreased scapular activation, as well as decreased B shoulder ROM and strength limiting functional use of B UEs.  Pt will benefit from skilled PT to address deficits listed to reduce pain and allow for improved functional UE use.    History and Personal Factors relevant to plan of care:  B mastectomies with reconstruction (+ R lymph node resection) s/p R breast cancer 08/2015; complex medical history as above    Clinical Presentation  Evolving    Clinical Presentation due to:  worsening B shoulder pain with limited resposne to injections; comlpex medical history as above    Clinical Decision Making  Moderate    Rehab Potential  Good    PT Frequency  2x / week    PT Duration  6  weeks    PT Treatment/Interventions  Patient/family education;Neuromuscular re-education;Therapeutic exercise;Therapeutic activities;Manual techniques;Taping;Dry needling;Electrical Stimulation;Moist Heat;Cryotherapy;Iontophoresis 4mg /ml Dexamethasone    Consulted and Agree with Plan of Care  Patient       Patient will benefit from skilled therapeutic intervention in order to improve the following deficits and impairments:  Pain, Postural dysfunction, Impaired flexibility, Decreased range of motion, Decreased strength, Increased muscle spasms, Impaired UE functional use, Decreased activity tolerance, Decreased endurance  Visit Diagnosis: Chronic right shoulder pain  Chronic left shoulder pain  Stiffness of right shoulder, not elsewhere classified  Stiffness of left shoulder, not elsewhere classified  Abnormal posture     Problem List Patient Active Problem List   Diagnosis Date Noted  . Rotator cuff syndrome of both shoulders 07/28/2017  . Pulmonary embolism ([place]) 01/29/2016  . Liver lesion 01/03/2016  . Family history of breast cancer   . Family history of ovarian cancer   . Family history of prostate cancer   . Family history of colon cancer   . Skin cancer   . Cancer ([place])   . Genetic testing 11/20/2015  . Ganglion cyst of flexor tendon sheath of finger of left hand 11/19/2015  . Breast cancer of lower-outer quadrant of right female breast ([place]) 09/22/2015  . 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 Peroneus longus tear 11/24/2013  . Toe swelling 09/06/2013  . Melanoma ([place]) 06/20/2011  . Hodgkin's lymphoma ([place]) 06/20/2011  . Migraine 06/20/2011  . Malignant neoplasm of thyroid gland ([place]) 06/20/2011  . Hyperglycemia 06/20/2011  . Hirsutism 06/20/2011    Percival Spanish, PT, MPT 08/17/2017, 7:08 PM  [facility] [address]  [place], [place], [address] Phone: [phone]    Fax:  [phone]  Name: Stacy Chung MRN: [id number] Date of Birth: [birth year]

## 2017-08-18 DIAGNOSIS — Z111 Encounter for screening for respiratory tuberculosis: Secondary | ICD-10-CM | POA: Diagnosis not present

## 2017-08-19 ENCOUNTER — Ambulatory Visit: Payer: BLUE CROSS/BLUE SHIELD

## 2017-08-19 DIAGNOSIS — M25511 Pain in right shoulder: Principal | ICD-10-CM

## 2017-08-19 DIAGNOSIS — M25611 Stiffness of right shoulder, not elsewhere classified: Secondary | ICD-10-CM | POA: Diagnosis not present

## 2017-08-19 DIAGNOSIS — M25612 Stiffness of left shoulder, not elsewhere classified: Secondary | ICD-10-CM

## 2017-08-19 DIAGNOSIS — R293 Abnormal posture: Secondary | ICD-10-CM

## 2017-08-19 DIAGNOSIS — M25512 Pain in left shoulder: Secondary | ICD-10-CM

## 2017-08-19 DIAGNOSIS — G8929 Other chronic pain: Secondary | ICD-10-CM

## 2017-08-19 NOTE — Therapy (Signed)
[facility] [address]  [place], [place], [address] Phone: [phone]   Fax:  [phone]  Physical Therapy Treatment  Patient Details  Name: Stacy Chung MRN: [id number] Date of Birth: 06-Dec-1970 Referring Provider: Aundria Mems, MD   Encounter Date: 08/19/2017  PT End of Session - 08/19/17 1407    Visit Number  2    Number of Visits  12    Date for PT Re-Evaluation  10/02/17    Authorization Type  BCBS [place];  Billing codes not covered: 26834 (Self-care) & 19622 (PPT)    PT Start Time  1401    PT Stop Time  1445    PT Time Calculation (min)  44 min    Activity Tolerance  Patient tolerated treatment well    Behavior During Therapy  [facility] for tasks assessed/performed       Past Medical History:  Diagnosis Date  . Allergy   . Anxiety   . Breast cancer ([place])   . 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 Cancer ([place]) 1986   Hodgkins lymphoma  . Carcinoma of thyroid ([place])   . Depression   . Family history of breast cancer   . Family history of colon cancer   . Family history of ovarian cancer   . Family history of prostate cancer   . GERD (gastroesophageal reflux disease)   . History of chicken pox   . Hodgkin's disease   . Hx: UTI (urinary tract infection)   . Increased frequency of headaches   . Left breast mass   . Migraines   . PONV (postoperative nausea and vomiting)   . Pulmonary embolism ([place]) 11/30/2015  . Skin cancer 2003   Melanoma  . Thyroid disease     Past Surgical History:  Procedure Laterality Date  . FOOT TENDON SURGERY Left   . LAPAROSCOPIC SPLENECTOMY  1987   Hodgkins Disease  . THORACOTOMY  1991   collasped lung, spontaneous  . THYROIDECTOMY  2004   nodules, carcinoma  . URETEROPLASTY  1977   malformed ureter    There were no vitals filed for this visit.  Subjective Assessment - 08/19/17 1405    Subjective  Pt. reporting daily adherence to HEP with  some muscular fatigue.    Pertinent History  B mastectomies with reconstruction (+ R lymph node resection) s/p R breast cancer 08/2015    Patient Stated Goals  "To be able to use my arms normally"    Currently in Pain?  No/denies    Pain Score  0-No pain 9/10 at worst     Multiple Pain Sites  No                      [facility] Adult PT Treatment/Exercise - 08/19/17 1408      Shoulder Exercises: Supine   Horizontal ABduction  Both;10 reps;Theraband;Strengthening    Theraband Level (Shoulder Horizontal ABduction)  Level 1 (Yellow)    Horizontal ABduction Limitations  Hooklying on pool noodle     External Rotation  Both;10 reps;Theraband;Strengthening    Theraband Level (Shoulder External Rotation)  Level 1 (Yellow)    External Rotation Limitations  Hooklying on poold noodle     Other Supine Exercises  Scapular retraction hooklying on pool noodle 5" x 10 reps       Shoulder Exercises: Standing   External Rotation  Both;10 reps    Theraband Level (Shoulder  External Rotation)  Level 1 (Yellow)    External Rotation Limitations  leaning on doorseal + scap. retraction     Extension  Both;10 reps;Theraband;Strengthening 2 sets     Theraband Level (Shoulder Extension)  Level 1 (Yellow)    Row  Both;10 reps;Theraband;Strengthening 2 sets     Theraband Level (Shoulder Row)  Level 1 (Yellow)      Shoulder Exercises: ROM/Strengthening   UBE (Upper Arm Bike)  UBE: lvl 1.0, 3 min forwards/back each     Cybex Row  15 reps    Cybex Row Limitations  10#; 3 sec scap. squeeze     Wall Pushups  10 reps      Shoulder Exercises: IT sales professional  30 seconds;1 rep    Corner Stretch Limitations  low/mid way doorway stretch     Other Shoulder Stretches  Supine chest stretch hooklying on pool noodle x 30 sec     Other Shoulder Stretches  B UT stretch x 30 sec                PT Short Term Goals - 08/19/17 1407      PT SHORT TERM GOAL #1   Title  Independent with initial HEP     Status  On-going      PT SHORT TERM GOAL #2   Title  Pt will verbalize/demonstrate understanding of neutral spine and shoulder posture to promote improved glenohumeral & scapular kinematics    Status  On-going        PT Long Term Goals - 08/19/17 1407      PT LONG TERM GOAL #1   Title  Independent with ongoing/advanced HEP    Status  On-going      PT LONG TERM GOAL #2   Title  B shoulder AROM WFL w/o increased pain     Status  On-going      PT LONG TERM GOAL #3   Title  B shoulder strength >/= 4+/5 w/o pain on resistance    Status  On-going      PT LONG TERM GOAL #4   Title  Pt will report ability to complete sustained overhead activity for >/= 7 minutes w/o residual pain or fatigue to allow her to dry her hair with a hair dryer    Status  On-going            Plan - 08/19/17 1407    Clinical Impression Statement  Taysia reporting some fatigue with HEP however no issues otherwise.  Pt. tolerated all gentle scapular strengthening and anterior chest stretching well today.  Reported fatigue to end treatment however pain free.  Will continue to progress toward goals in coming visits.        PT Treatment/Interventions  Patient/family education;Neuromuscular re-education;Therapeutic exercise;Therapeutic activities;Manual techniques;Taping;Dry needling;Electrical Stimulation;Moist Heat;Cryotherapy;Iontophoresis 4mg /ml Dexamethasone    Consulted and Agree with Plan of Care  Patient       Patient will benefit from skilled therapeutic intervention in order to improve the following deficits and impairments:  Pain, Postural dysfunction, Impaired flexibility, Decreased range of motion, Decreased strength, Increased muscle spasms, Impaired UE functional use, Decreased activity tolerance, Decreased endurance  Visit Diagnosis: Chronic right shoulder pain  Chronic left shoulder pain  Stiffness of right shoulder, not elsewhere classified  Stiffness of left shoulder, not elsewhere  classified  Abnormal posture     Problem List Patient Active Problem List   Diagnosis Date Noted  . Rotator cuff syndrome of both shoulders 07/28/2017  .  Pulmonary embolism ([place]) 01/29/2016  . Liver lesion 01/03/2016  . Family history of breast cancer   . Family history of ovarian cancer   . Family history of prostate cancer   . Family history of colon cancer   . Skin cancer   . Cancer ([place])   . Genetic testing 11/20/2015  . Ganglion cyst of flexor tendon sheath of finger of left hand 11/19/2015  . Breast cancer of lower-outer quadrant of right female breast ([place]) 09/22/2015  . 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 Peroneus longus tear 11/24/2013  . Toe swelling 09/06/2013  . Melanoma ([place]) 06/20/2011  . Hodgkin's lymphoma ([place]) 06/20/2011  . Migraine 06/20/2011  . Malignant neoplasm of thyroid gland ([place]) 06/20/2011  . Hyperglycemia 06/20/2011  . Hirsutism 06/20/2011    Bess Harvest, PTA 08/19/17 2:52 PM  [facility] [address]  [place], [place], [address] Phone: [phone]   Fax:  [phone]  Name: Stacy Chung MRN: [id number] Date of Birth: [birth year]

## 2017-08-24 ENCOUNTER — Ambulatory Visit: Payer: BLUE CROSS/BLUE SHIELD

## 2017-08-26 ENCOUNTER — Ambulatory Visit: Payer: BLUE CROSS/BLUE SHIELD | Admitting: Physical Therapy

## 2017-08-26 ENCOUNTER — Encounter: Payer: Self-pay | Admitting: Physical Therapy

## 2017-08-26 DIAGNOSIS — M25611 Stiffness of right shoulder, not elsewhere classified: Secondary | ICD-10-CM | POA: Diagnosis not present

## 2017-08-26 DIAGNOSIS — M25511 Pain in right shoulder: Secondary | ICD-10-CM | POA: Diagnosis not present

## 2017-08-26 DIAGNOSIS — G8929 Other chronic pain: Secondary | ICD-10-CM

## 2017-08-26 DIAGNOSIS — R293 Abnormal posture: Secondary | ICD-10-CM

## 2017-08-26 DIAGNOSIS — M25512 Pain in left shoulder: Secondary | ICD-10-CM

## 2017-08-26 DIAGNOSIS — M25612 Stiffness of left shoulder, not elsewhere classified: Secondary | ICD-10-CM | POA: Diagnosis not present

## 2017-08-26 NOTE — Therapy (Signed)
[facility] [address]  [place], [place], [address] Phone: [phone]   Fax:  [phone]  Physical Therapy Treatment  Patient Details  Name: Stacy Chung MRN: [id number] Date of Birth: 04-Sep-1970 Referring Provider: Aundria Mems, MD   Encounter Date: 08/26/2017  PT End of Session - 08/26/17 1151    Visit Number  3    Number of Visits  12    Date for PT Re-Evaluation  10/02/17    Authorization Type  BCBS - [place];  Billing codes not covered: 98921 (Self-care) & 19417 (PPT)    PT Start Time  1151    PT Stop Time  1235    PT Time Calculation (min)  44 min    Activity Tolerance  Patient tolerated treatment well    Behavior During Therapy  [facility] for tasks assessed/performed       Past Medical History:  Diagnosis Date  . Allergy   . Anxiety   . Breast cancer ([place])   . 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 Cancer ([place]) 1986   Hodgkins lymphoma  . Carcinoma of thyroid ([place])   . Depression   . Family history of breast cancer   . Family history of colon cancer   . Family history of ovarian cancer   . Family history of prostate cancer   . GERD (gastroesophageal reflux disease)   . History of chicken pox   . Hodgkin's disease   . Hx: UTI (urinary tract infection)   . Increased frequency of headaches   . Left breast mass   . Migraines   . PONV (postoperative nausea and vomiting)   . Pulmonary embolism ([place]) 11/30/2015  . Skin cancer 2003   Melanoma  . Thyroid disease     Past Surgical History:  Procedure Laterality Date  . FOOT TENDON SURGERY Left   . LAPAROSCOPIC SPLENECTOMY  1987   Hodgkins Disease  . THORACOTOMY  1991   collasped lung, spontaneous  . THYROIDECTOMY  2004   nodules, carcinoma  . URETEROPLASTY  1977   malformed ureter    There were no vitals filed for this visit.  Subjective Assessment - 08/26/17 1153    Subjective  Pt. reporting daily adherence to HEP  with some muscular fatigue.    Pertinent History  B mastectomies with reconstruction (+ R lymph node resection) s/p R breast cancer 08/2015    Patient Stated Goals  "To be able to use my arms normally"    Currently in Pain?  Yes    Pain Score  4     Pain Location  Shoulder    Pain Orientation  Left    Pain Descriptors / Indicators  Heaviness    Pain Type  Acute pain;Chronic pain    Aggravating Factors   while drying her hair                      [facility] Adult PT Treatment/Exercise - 08/26/17 1151      Self-Care   Self-Care  Other Self-Care Comments    Other Self-Care Comments   Instructed pt in use of small ball on wall for self-STM/MFR to pecs & posterior capsule.      Exercises   Exercises  Shoulder      Shoulder Exercises: Supine   Protraction  Both;10 reps;Weights;Strengthening    Protraction Weight (lbs)  1    Horizontal ABduction  Both;15 reps;Theraband;Strengthening  Theraband Level (Shoulder Horizontal ABduction)  Level 1 (Yellow)    Horizontal ABduction Limitations  hooklying on pool noodle, cues for scapular retraction    External Rotation  Both;15 reps;Theraband;Strengthening    Theraband Level (Shoulder External Rotation)  Level 1 (Yellow)    External Rotation Limitations  hooklying on pool noodle, cues for scapular retraction    Flexion  Both;10 reps;Theraband;Strengthening    Theraband Level (Shoulder Flexion)  Level 1 (Yellow)    Flexion Limitations  + opp shoulder extension; hooklying on pool noodle, cues for scapular retraction    Other Supine Exercises  R/L shoulder circles at 90dg flexion 1# x10 each CW/CCW      Shoulder Exercises: Therapy Ball   Flexion  10 reps    Flexion Limitations  orange Pball on wall, 5" pause for stretch at top of motion      Shoulder Exercises: ROM/Strengthening   UBE (Upper Arm Bike)  L 2.0 fwd/back x3' each    Wall Pushups  10 reps      Shoulder Exercises: Stretch   Other Shoulder Stretches  Supine chest stretch  hooklying on pool noodle x2' (varying UE position)      Manual Therapy   Manual Therapy  Joint mobilization;Soft tissue mobilization    Joint Mobilization  L shoulder - distraction, inf glide & A/P mobs    Soft tissue mobilization  L pecs & posterior capsule (teres group & lats)               PT Short Term Goals - 08/19/17 1407      PT SHORT TERM GOAL #1   Title  Independent with initial HEP    Status  On-going      PT SHORT TERM GOAL #2   Title  Pt will verbalize/demonstrate understanding of neutral spine and shoulder posture to promote improved glenohumeral & scapular kinematics    Status  On-going        PT Long Term Goals - 08/19/17 1407      PT LONG TERM GOAL #1   Title  Independent with ongoing/advanced HEP    Status  On-going      PT LONG TERM GOAL #2   Title  B shoulder AROM WFL w/o increased pain     Status  On-going      PT LONG TERM GOAL #3   Title  B shoulder strength >/= 4+/5 w/o pain on resistance    Status  On-going      PT LONG TERM GOAL #4   Title  Pt will report ability to complete sustained overhead activity for >/= 7 minutes w/o residual pain or fatigue to allow her to dry her hair with a hair dryer    Status  On-going            Plan - 08/26/17 1155    Clinical Impression Statement  Pt noting improvement in pain and posture as well as reduction in tightness with HEP performance. Also feeling looser after last therapy session. Still with limited tolerance for sustained overhead activities, noting increased L shoulder pain today while blow-drying her hair, as well as mild L shoulder discomfort at end range flexion with orange Pball on wall. Pt reporting improved tolerance for L shoulder motion/strengthening after joint mobs, but reports difficulty relaxing during STM/MFR, therefore provided instruction in self- STM/MFR using small ball on wall to allow better self-control of activity.    Rehab Potential  Good    PT Treatment/Interventions   Patient/family  education;Neuromuscular re-education;Therapeutic exercise;Therapeutic activities;Manual techniques;Taping;Dry needling;Electrical Stimulation;Moist Heat;Cryotherapy;Iontophoresis 4mg /ml Dexamethasone    Consulted and Agree with Plan of Care  Patient       Patient will benefit from skilled therapeutic intervention in order to improve the following deficits and impairments:  Pain, Postural dysfunction, Impaired flexibility, Decreased range of motion, Decreased strength, Increased muscle spasms, Impaired UE functional use, Decreased activity tolerance, Decreased endurance  Visit Diagnosis: Chronic right shoulder pain  Chronic left shoulder pain  Stiffness of right shoulder, not elsewhere classified  Stiffness of left shoulder, not elsewhere classified  Abnormal posture     Problem List Patient Active Problem List   Diagnosis Date Noted  . Rotator cuff syndrome of both shoulders 07/28/2017  . Pulmonary embolism () 01/29/2016  . Liver lesion 01/03/2016  . Family history of breast cancer   . Family history of ovarian cancer   . Family history of prostate cancer   . Family history of colon cancer   . Skin cancer   . Cancer ([place])   . Genetic testing 11/20/2015  . Ganglion cyst of flexor tendon sheath of finger of left hand 11/19/2015  . Breast cancer of lower-outer quadrant of right female breast ([place]) 09/22/2015  . 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 Peroneus longus tear 11/24/2013  . Toe swelling 09/06/2013  . Melanoma ([place]) 06/20/2011  . Hodgkin's lymphoma ([place]) 06/20/2011  . Migraine 06/20/2011  . Malignant neoplasm of thyroid gland ([place]) 06/20/2011  . Hyperglycemia 06/20/2011  . Hirsutism 06/20/2011    Percival Spanish, PT, MPT 08/26/2017, 12:46 PM  [facility] [address]  [place], [place], [address] Phone: [phone]   Fax:  [phone]  Name: Stacy Chung MRN: [id number] Date of Birth: 1971-05-08

## 2017-09-01 ENCOUNTER — Ambulatory Visit: Payer: BLUE CROSS/BLUE SHIELD | Admitting: Physical Therapy

## 2017-09-01 ENCOUNTER — Encounter: Payer: Self-pay | Admitting: Physical Therapy

## 2017-09-01 DIAGNOSIS — Z85850 Personal history of malignant neoplasm of thyroid: Secondary | ICD-10-CM | POA: Diagnosis not present

## 2017-09-01 DIAGNOSIS — F418 Other specified anxiety disorders: Secondary | ICD-10-CM | POA: Diagnosis not present

## 2017-09-01 DIAGNOSIS — R293 Abnormal posture: Secondary | ICD-10-CM

## 2017-09-01 DIAGNOSIS — M25512 Pain in left shoulder: Secondary | ICD-10-CM

## 2017-09-01 DIAGNOSIS — M25611 Stiffness of right shoulder, not elsewhere classified: Secondary | ICD-10-CM | POA: Diagnosis not present

## 2017-09-01 DIAGNOSIS — Z0000 Encounter for general adult medical examination without abnormal findings: Secondary | ICD-10-CM | POA: Diagnosis not present

## 2017-09-01 DIAGNOSIS — M25511 Pain in right shoulder: Secondary | ICD-10-CM | POA: Diagnosis not present

## 2017-09-01 DIAGNOSIS — M25612 Stiffness of left shoulder, not elsewhere classified: Secondary | ICD-10-CM

## 2017-09-01 DIAGNOSIS — G8929 Other chronic pain: Secondary | ICD-10-CM | POA: Diagnosis not present

## 2017-09-01 DIAGNOSIS — E559 Vitamin D deficiency, unspecified: Secondary | ICD-10-CM | POA: Diagnosis not present

## 2017-09-01 DIAGNOSIS — R82998 Other abnormal findings in urine: Secondary | ICD-10-CM | POA: Diagnosis not present

## 2017-09-01 NOTE — Therapy (Signed)
[facility] [address]  [place], [place], [address] Phone: [phone]   Fax:  [phone]  Physical Therapy Treatment  Patient Details  Name: Stacy Chung MRN: [id number] Date of Birth: 23-Aug-1970 Referring Provider: Aundria Mems, MD   Encounter Date: 09/01/2017  PT End of Session - 09/01/17 1311    Visit Number  4    Number of Visits  12    Date for PT Re-Evaluation  10/02/17    Authorization Type  BCBS - [place]: [place];  Billing codes not covered: 39767 (Self-care) & 34193 (PPT)    PT Start Time  1311    PT Stop Time  1402    PT Time Calculation (min)  51 min    Activity Tolerance  Patient tolerated treatment well    Behavior During Therapy  [facility] for tasks assessed/performed       Past Medical History:  Diagnosis Date  . Allergy   . Anxiety   . Breast cancer ([place])   . 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 Cancer ([place]) 1986   Hodgkins lymphoma  . Carcinoma of thyroid ([place])   . Depression   . Family history of breast cancer   . Family history of colon cancer   . Family history of ovarian cancer   . Family history of prostate cancer   . GERD (gastroesophageal reflux disease)   . History of chicken pox   . Hodgkin's disease   . Hx: UTI (urinary tract infection)   . Increased frequency of headaches   . Left breast mass   . Migraines   . PONV (postoperative nausea and vomiting)   . Pulmonary embolism ([place]) 11/30/2015  . Skin cancer 2003   Melanoma  . Thyroid disease     Past Surgical History:  Procedure Laterality Date  . FOOT TENDON SURGERY Left   . LAPAROSCOPIC SPLENECTOMY  1987   Hodgkins Disease  . THORACOTOMY  1991   collasped lung, spontaneous  . THYROIDECTOMY  2004   nodules, carcinoma  . URETEROPLASTY  1977   malformed ureter    There were no vitals filed for this visit.  Subjective Assessment - 09/01/17 1313    Subjective  Pt reporting good relief from manual  therapy lasting several days, but did note some muscle soresness following exercises.    Patient Stated Goals  "To be able to use my arms normally"    Currently in Pain?  Yes    Pain Score  2     Pain Location  Shoulder    Pain Orientation  Right;Left    Pain Descriptors / Indicators  Dull;Aching    Pain Type  Acute pain;Chronic pain    Pain Frequency  Intermittent    Pain Score  0 7-9/02 with touch or certain positions    Pain Location  Neck    Pain Orientation  Right    Pain Descriptors / Indicators  Tender    Pain Type  Acute pain;Chronic pain    Pain Frequency  Intermittent                      [facility] Adult PT Treatment/Exercise - 09/01/17 1311      Exercises   Exercises  Shoulder      Shoulder Exercises: Standing   Horizontal ABduction  Both;10 reps;Theraband;Strengthening    Theraband Level (Shoulder Horizontal ABduction)  Level 1 (Yellow)    Horizontal ABduction  Limitations  standing against pool noodle on wall    Extension  Both;10 reps;Theraband;Strengthening    Theraband Level (Shoulder Extension)  Level 2 (Red)    Row  Both;10 reps;Theraband;Strengthening    Theraband Level (Shoulder Row)  Level 2 (Red)      Shoulder Exercises: ROM/Strengthening   UBE (Upper Arm Bike)  L 2.5 fwd/back x3' each    Wall Pushups  10 reps 2 sets    Wall Pushups Limitations  2nd set on orange Pball      Shoulder Exercises: Stretch   Cross Chest Stretch  30 seconds;1 rep    Cross Chest Stretch Limitations  posterior capsule stretch    Other Shoulder Stretches  B UT, LS & rhomboid stretches x30" each              PT Education - 09/01/17 1402    Education provided  Yes    Education Details  HEP update - additonal stretching    Person(s) Educated  Patient    Methods  Explanation;Demonstration;Handout    Comprehension  Verbalized understanding;Returned demonstration       PT Short Term Goals - 08/19/17 1407      PT SHORT TERM GOAL #1   Title  Independent with  initial HEP    Status  On-going      PT SHORT TERM GOAL #2   Title  Pt will verbalize/demonstrate understanding of neutral spine and shoulder posture to promote improved glenohumeral & scapular kinematics    Status  On-going        PT Long Term Goals - 08/19/17 1407      PT LONG TERM GOAL #1   Title  Independent with ongoing/advanced HEP    Status  On-going      PT LONG TERM GOAL #2   Title  B shoulder AROM WFL w/o increased pain     Status  On-going      PT LONG TERM GOAL #3   Title  B shoulder strength >/= 4+/5 w/o pain on resistance    Status  On-going      PT LONG TERM GOAL #4   Title  Pt will report ability to complete sustained overhead activity for >/= 7 minutes w/o residual pain or fatigue to allow her to dry her hair with a hair dryer    Status  On-going            Plan - 09/01/17 1319    Clinical Impression Statement  Pt noting good relief from manual therapy to L shoulder on last visit, but notes increased awareness of tension on R as a result. Continued manual focus with pt reporting good relief followed by progression of exercise to upright positioning for increased gravity influence with good tolerance. HEP updated to include further stretching to promote carryover of muscle tension reduction from manual therapy.    Rehab Potential  Good    PT Treatment/Interventions  Patient/family education;Neuromuscular re-education;Therapeutic exercise;Therapeutic activities;Manual techniques;Taping;Dry needling;Electrical Stimulation;Moist Heat;Cryotherapy;Iontophoresis 4mg /ml Dexamethasone    Consulted and Agree with Plan of Care  Patient       Patient will benefit from skilled therapeutic intervention in order to improve the following deficits and impairments:  Pain, Postural dysfunction, Impaired flexibility, Decreased range of motion, Decreased strength, Increased muscle spasms, Impaired UE functional use, Decreased activity tolerance, Decreased endurance  Visit  Diagnosis: Chronic right shoulder pain  Chronic left shoulder pain  Stiffness of right shoulder, not elsewhere classified  Stiffness of left shoulder, not elsewhere  classified  Abnormal posture     Problem List Patient Active Problem List   Diagnosis Date Noted  . Rotator cuff syndrome of both shoulders 07/28/2017  . Pulmonary embolism ([place]) 01/29/2016  . Liver lesion 01/03/2016  . Family history of breast cancer   . Family history of ovarian cancer   . Family history of prostate cancer   . Family history of colon cancer   . Skin cancer   . Cancer ([place])   . Genetic testing 11/20/2015  . Ganglion cyst of flexor tendon sheath of finger of left hand 11/19/2015  . Breast cancer of lower-outer quadrant of right female breast ([place]) 09/22/2015  . 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 Peroneus longus tear 11/24/2013  . Toe swelling 09/06/2013  . Melanoma ([place]) 06/20/2011  . Hodgkin's lymphoma ([place]) 06/20/2011  . Migraine 06/20/2011  . Malignant neoplasm of thyroid gland ([place]) 06/20/2011  . Hyperglycemia 06/20/2011  . Hirsutism 06/20/2011    Percival Spanish, PT, MPT 09/01/2017, 4:32 PM  [facility] [address]  [place], [place], [address] Phone: [phone]   Fax:  [phone]  Name: Stacy Chung MRN: [id number] Date of Birth: 01-29-1971

## 2017-09-03 ENCOUNTER — Ambulatory Visit: Payer: BLUE CROSS/BLUE SHIELD

## 2017-09-03 DIAGNOSIS — M25612 Stiffness of left shoulder, not elsewhere classified: Secondary | ICD-10-CM | POA: Diagnosis not present

## 2017-09-03 DIAGNOSIS — M25611 Stiffness of right shoulder, not elsewhere classified: Secondary | ICD-10-CM | POA: Diagnosis not present

## 2017-09-03 DIAGNOSIS — M25511 Pain in right shoulder: Secondary | ICD-10-CM | POA: Diagnosis not present

## 2017-09-03 DIAGNOSIS — R293 Abnormal posture: Secondary | ICD-10-CM

## 2017-09-03 DIAGNOSIS — M25512 Pain in left shoulder: Secondary | ICD-10-CM | POA: Diagnosis not present

## 2017-09-03 DIAGNOSIS — G8929 Other chronic pain: Secondary | ICD-10-CM

## 2017-09-03 NOTE — Therapy (Signed)
[facility] [address]  [place], [place], [address] Phone: [phone]   Fax:  [phone]  Physical Therapy Treatment  Patient Details  Name: Stacy Chung MRN: [id number] Date of Birth: June 24, 1971 Referring Provider: Aundria Mems, MD   Encounter Date: 09/03/2017  PT End of Session - 09/03/17 1318    Visit Number  5    Number of Visits  12    Date for PT Re-Evaluation  10/02/17    Authorization Type  BCBS [place];  Billing codes not covered: 62035 (Self-care) & 59741 (PPT)    PT Start Time  6384    PT Stop Time  1405    PT Time Calculation (min)  50 min    Activity Tolerance  Patient tolerated treatment well    Behavior During Therapy  [facility] for tasks assessed/performed       Past Medical History:  Diagnosis Date  . Allergy   . Anxiety   . Breast cancer ([place])   . 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 Cancer ([place]) 1986   Hodgkins lymphoma  . Carcinoma of thyroid ([place])   . Depression   . Family history of breast cancer   . Family history of colon cancer   . Family history of ovarian cancer   . Family history of prostate cancer   . GERD (gastroesophageal reflux disease)   . History of chicken pox   . Hodgkin's disease   . Hx: UTI (urinary tract infection)   . Increased frequency of headaches   . Left breast mass   . Migraines   . PONV (postoperative nausea and vomiting)   . Pulmonary embolism ([place]) 11/30/2015  . Skin cancer 2003   Melanoma  . Thyroid disease     Past Surgical History:  Procedure Laterality Date  . FOOT TENDON SURGERY Left   . LAPAROSCOPIC SPLENECTOMY  1987   Hodgkins Disease  . THORACOTOMY  1991   collasped lung, spontaneous  . THYROIDECTOMY  2004   nodules, carcinoma  . URETEROPLASTY  1977   malformed ureter    There were no vitals filed for this visit.  Subjective Assessment - 09/03/17 1318    Subjective  Pt. reporting no issues with latest HEP  stretches at home.      Pertinent History  B mastectomies with reconstruction (+ R lymph node resection) s/p R breast cancer 08/2015    Patient Stated Goals  "To be able to use my arms normally"    Currently in Pain?  No/denies    Pain Score  0-No pain    Multiple Pain Sites  No         [facility] PT Assessment - 09/03/17 1401      AROM   AROM Assessment Site  Shoulder    Right/Left Shoulder  Right;Left    Right Shoulder Flexion  137 Degrees    Right Shoulder ABduction  133 Degrees    Right Shoulder Internal Rotation  -- FIR to bra strap     Right Shoulder External Rotation  -- FER to T 4    Left Shoulder Flexion  138 Degrees    Left Shoulder ABduction  131 Degrees    Left Shoulder Internal Rotation  -- FIR to bra strap     Left Shoulder External Rotation  -- FER to T2      Strength   Strength Assessment Site  Shoulder    Right/Left  Shoulder  Right;Left    Right Shoulder Flexion  4/5    Right Shoulder ABduction  4/5 pain with resistance     Right Shoulder Internal Rotation  4/5    Right Shoulder External Rotation  4/5    Left Shoulder Flexion  4/5    Left Shoulder Extension  4/5 L shoulder abduction     Left Shoulder Internal Rotation  4/5    Left Shoulder External Rotation  4/5                  [facility] Adult PT Treatment/Exercise - 09/03/17 1325      Shoulder Exercises: Prone   Extension  Both;10 reps Cues required for scapular retraction + depression     Extension Limitations  "I" leaning over orange p-ball on mat table     Horizontal ABduction 1  Both;10 reps Cues required for scapular retraction + depression     Horizontal ABduction 1 Limitations  "T" leaning over orange p-ball on mat table     Horizontal ABduction 2  Both;10 reps Cues required for scapular retraction + depression     Horizontal ABduction 2 Limitations  "Y" leaning over orange p-ball on mat table       Shoulder Exercises: Standing   External Rotation  Both;10 reps    Theraband Level (Shoulder  External Rotation)  Level 2 (Red)    External Rotation Limitations  leaning on 1/2 foam bolster on wall     Row  15 reps    Row Limitations  TRX - tc/vc's for scap. retraction + depression     Other Standing Exercises  Alternating shoulder flexion/extension with red TB (X pattern) leaning on 1/2 foam bolster on wall       Shoulder Exercises: ROM/Strengthening   UBE (Upper Arm Bike)  L 2.5 fwd/back x3' each    Wall Pushups  15 reps      Shoulder Exercises: Stretch   Cross Chest Stretch  30 seconds;1 rep    Cross Chest Stretch Limitations  posterior capsule stretch    Other Shoulder Stretches  Supine chest stretch hooklying on pool noodle x2' (varying UE position) light overpressure from therapist      Manual Therapy   Manual Therapy  Soft tissue mobilization;Scapular mobilization    Scapular Mobilization  L scapular medial glide stretch prone with L UE in supported flexion position x 1 min  pt. verbalized L shoulder felt "looser" following this              PT Education - 09/03/17 1417    Education provided  Yes    Education Details  ball release to posterior shoulder, wall pushup     Person(s) Educated  Patient    Methods  Explanation;Demonstration;Verbal cues;Handout    Comprehension  Verbalized understanding;Returned demonstration;Verbal cues required;Need further instruction       PT Short Term Goals - 09/03/17 1319      PT SHORT TERM GOAL #1   Title  Independent with initial HEP    Status  Achieved      PT SHORT TERM GOAL #2   Title  Pt will verbalize/demonstrate understanding of neutral spine and shoulder posture to promote improved glenohumeral & scapular kinematics    Status  On-going        PT Long Term Goals - 08/19/17 1407      PT LONG TERM GOAL #1   Title  Independent with ongoing/advanced HEP    Status  On-going  PT LONG TERM GOAL #2   Title  B shoulder AROM WFL w/o increased pain     Status  On-going      PT LONG TERM GOAL #3   Title  B  shoulder strength >/= 4+/5 w/o pain on resistance    Status  On-going      PT LONG TERM GOAL #4   Title  Pt will report ability to complete sustained overhead activity for >/= 7 minutes w/o residual pain or fatigue to allow her to dry her hair with a hair dryer    Status  On-going            Plan - 09/03/17 1319    Clinical Impression Statement  Marlisa making good progress with therapy able to demo improvement in B shoulder strength and AROM today.  Progressed scapular strengthening to include partial prone position I's, T's, Y's which were well tolerated.  Emil reports she feels comfortable with recent HEP addition.  Did have some tenderness/tightness in posterior shoulder thus STM to this area with pt. reporting feeling "looser" following this.  Will continues to progress toward goals.    PT Treatment/Interventions  Patient/family education;Neuromuscular re-education;Therapeutic exercise;Therapeutic activities;Manual techniques;Taping;Dry needling;Electrical Stimulation;Moist Heat;Cryotherapy;Iontophoresis 4mg /ml Dexamethasone    Consulted and Agree with Plan of Care  Patient       Patient will benefit from skilled therapeutic intervention in order to improve the following deficits and impairments:  Pain, Postural dysfunction, Impaired flexibility, Decreased range of motion, Decreased strength, Increased muscle spasms, Impaired UE functional use, Decreased activity tolerance, Decreased endurance  Visit Diagnosis: Chronic right shoulder pain  Chronic left shoulder pain  Stiffness of right shoulder, not elsewhere classified  Stiffness of left shoulder, not elsewhere classified  Abnormal posture     Problem List Patient Active Problem List   Diagnosis Date Noted  . Rotator cuff syndrome of both shoulders 07/28/2017  . Pulmonary embolism ([place]) 01/29/2016  . Liver lesion 01/03/2016  . Family history of breast cancer   . Family history of ovarian cancer   . Family history of  prostate cancer   . Family history of colon cancer   . Skin cancer   . Cancer ([place])   . Genetic testing 11/20/2015  . Ganglion cyst of flexor tendon sheath of finger of left hand 11/19/2015  . Breast cancer of lower-outer quadrant of right female breast ([place]) 09/22/2015  . 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 Peroneus longus tear 11/24/2013  . Toe swelling 09/06/2013  . Melanoma ([place]) 06/20/2011  . Hodgkin's lymphoma ([place]) 06/20/2011  . Migraine 06/20/2011  . Malignant neoplasm of thyroid gland ([place]) 06/20/2011  . Hyperglycemia 06/20/2011  . Hirsutism 06/20/2011    Bess Harvest, PTA 09/03/17 2:29 PM  [facility] [address]  [place], [place], [address] Phone: [phone]   Fax:  [phone]  Name: Stacy Chung MRN: [id number] Date of Birth: 07/10/1971

## 2017-09-07 DIAGNOSIS — Z7901 Long term (current) use of anticoagulants: Secondary | ICD-10-CM | POA: Diagnosis not present

## 2017-09-07 DIAGNOSIS — Z6828 Body mass index (BMI) 28.0-28.9, adult: Secondary | ICD-10-CM | POA: Diagnosis not present

## 2017-09-07 DIAGNOSIS — I2699 Other pulmonary embolism without acute cor pulmonale: Secondary | ICD-10-CM | POA: Diagnosis not present

## 2017-09-07 DIAGNOSIS — K529 Noninfective gastroenteritis and colitis, unspecified: Secondary | ICD-10-CM | POA: Diagnosis not present

## 2017-09-07 DIAGNOSIS — Z1389 Encounter for screening for other disorder: Secondary | ICD-10-CM | POA: Diagnosis not present

## 2017-09-07 DIAGNOSIS — Z0000 Encounter for general adult medical examination without abnormal findings: Secondary | ICD-10-CM | POA: Diagnosis not present

## 2017-09-07 DIAGNOSIS — C50919 Malignant neoplasm of unspecified site of unspecified female breast: Secondary | ICD-10-CM | POA: Diagnosis not present

## 2017-09-08 ENCOUNTER — Ambulatory Visit: Payer: BLUE CROSS/BLUE SHIELD

## 2017-09-08 ENCOUNTER — Ambulatory Visit: Payer: BLUE CROSS/BLUE SHIELD | Admitting: Sports Medicine

## 2017-09-08 DIAGNOSIS — R293 Abnormal posture: Secondary | ICD-10-CM

## 2017-09-08 DIAGNOSIS — G8929 Other chronic pain: Secondary | ICD-10-CM

## 2017-09-08 DIAGNOSIS — M25511 Pain in right shoulder: Principal | ICD-10-CM

## 2017-09-08 DIAGNOSIS — M25612 Stiffness of left shoulder, not elsewhere classified: Secondary | ICD-10-CM

## 2017-09-08 DIAGNOSIS — M25611 Stiffness of right shoulder, not elsewhere classified: Secondary | ICD-10-CM | POA: Diagnosis not present

## 2017-09-08 DIAGNOSIS — M25512 Pain in left shoulder: Secondary | ICD-10-CM | POA: Diagnosis not present

## 2017-09-08 DIAGNOSIS — Z0189 Encounter for other specified special examinations: Secondary | ICD-10-CM

## 2017-09-08 NOTE — Therapy (Addendum)
[facility] [address]  [place], [place], [address] Phone: [phone]   Fax:  [phone]  Physical Therapy Treatment  Patient Details  Name: Stacy Chung MRN: [id number] Date of Birth: 12-22-1970 Referring Provider: Aundria Mems, MD   Encounter Date: 09/08/2017  PT End of Session - 09/08/17 1712    Visit Number  6    Number of Visits  12    Date for PT Re-Evaluation  10/02/17    Authorization Type  BCBS - [place]: [place];  Billing codes not covered: 14481 (Self-care) & 85631 (PPT)    PT Start Time  1707    PT Stop Time  1752    PT Time Calculation (min)  45 min    Activity Tolerance  Patient tolerated treatment well    Behavior During Therapy  [facility] for tasks assessed/performed       Past Medical History:  Diagnosis Date  . Allergy   . Anxiety   . Breast cancer ([place])   . 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 Cancer ([place]) 1986   Hodgkins lymphoma  . Carcinoma of thyroid ([place])   . Depression   . Family history of breast cancer   . Family history of colon cancer   . Family history of ovarian cancer   . Family history of prostate cancer   . GERD (gastroesophageal reflux disease)   . History of chicken pox   . Hodgkin's disease   . Hx: UTI (urinary tract infection)   . Increased frequency of headaches   . Left breast mass   . Migraines   . PONV (postoperative nausea and vomiting)   . Pulmonary embolism ([place]) 11/30/2015  . Skin cancer 2003   Melanoma  . Thyroid disease     Past Surgical History:  Procedure Laterality Date  . FOOT TENDON SURGERY Left   . LAPAROSCOPIC SPLENECTOMY  1987   Hodgkins Disease  . THORACOTOMY  1991   collasped lung, spontaneous  . THYROIDECTOMY  2004   nodules, carcinoma  . URETEROPLASTY  1977   malformed ureter    There were no vitals filed for this visit.  Subjective Assessment - 09/08/17 1709    Subjective  Pt. reporting more "tension" in R  shoulder today.  Pt. reporting she was able to clean out top shelves in kitchen with overhead movement intermittently for 45 min over weekend.      Pertinent History  B mastectomies with reconstruction (+ R lymph node resection) s/p R breast cancer 08/2015    Patient Stated Goals  "To be able to use my arms normally"    Currently in Pain?  No/denies    Pain Score  0-No pain    Multiple Pain Sites  No                      [facility] Adult PT Treatment/Exercise - 09/08/17 1720      Self-Care   Self-Care  --      Shoulder Exercises: Prone   Extension  Both;15 reps    Extension Limitations  prone "I" leaning over green p-ball     Horizontal ABduction 1  Both;15 reps    Horizontal ABduction 1 Limitations  prone "T" leaning over green p-ball       Shoulder Exercises: Standing   External Rotation  Both;15 reps    Theraband Level (Shoulder External Rotation)  Level 2 (Red)  External Rotation Limitations  leaning on doorseal     Internal Rotation  Right;Left;15 reps;Theraband    Theraband Level (Shoulder Internal Rotation)  Level 2 (Red)    Extension  Both;15 reps;Theraband;Strengthening    Theraband Level (Shoulder Extension)  Level 2 (Red)      Shoulder Exercises: ROM/Strengthening   UBE (Upper Arm Bike)  L 3.0 fwd/back x3' each      Shoulder Exercises: Stretch   Corner Stretch  30 seconds;1 rep    Corner Stretch Limitations  low/mid way doorway stretch     Other Shoulder Stretches  Supine chest stretch hooklying on pool noodle x2' (varying UE position)      Manual Therapy   Manual Therapy  Soft tissue mobilization;Scapular mobilization    Manual therapy comments  Prone     Soft tissue mobilization  STM to B posterior/inferior shoulder in area of tenderness     Scapular Mobilization  B scapular medial glide stretch prone with UE in supported flexion position x 1 min              PT Education - 09/08/17 1756    Education provided  Yes    Education Details   shoulder IR/ER with red TB issued to pt. (pt. instructed to use red TB for row and row/extension as well)    Person(s) Educated  Patient    Methods  Explanation;Demonstration;Verbal cues;Handout    Comprehension  Verbalized understanding;Returned demonstration;Verbal cues required;Need further instruction       PT Short Term Goals - 09/03/17 1319      PT SHORT TERM GOAL #1   Title  Independent with initial HEP    Status  Achieved      PT SHORT TERM GOAL #2   Title  Pt will verbalize/demonstrate understanding of neutral spine and shoulder posture to promote improved glenohumeral & scapular kinematics    Status  On-going        PT Long Term Goals - 08/19/17 1407      PT LONG TERM GOAL #1   Title  Independent with ongoing/advanced HEP    Status  On-going      PT LONG TERM GOAL #2   Title  B shoulder AROM WFL w/o increased pain     Status  On-going      PT LONG TERM GOAL #3   Title  B shoulder strength >/= 4+/5 w/o pain on resistance    Status  On-going      PT LONG TERM GOAL #4   Title  Pt will report ability to complete sustained overhead activity for >/= 7 minutes w/o residual pain or fatigue to allow her to dry her hair with a hair dryer    Status  On-going            Plan - 09/08/17 1800    Clinical Impression Statement  Pt. reporting L shoulder feeling "looser" after last visit and increased R shoulder "tension" today without known trigger.  Was pleased that she was able to clean out top shelves in kitchen over weekend without issue.  Tolerated progression of I's, T's, Y's and band resisted scapular strengthening well today.  HEP updated with RTC band strengthening.  Verbalized relief from shoulder "tension" today with manual work to posterior shoulder.  Encouraged to perform self-ball release to posterior shoulder at home.  Will continue to progress toward goals.     PT Treatment/Interventions  Patient/family education;Neuromuscular re-education;Therapeutic  exercise;Therapeutic activities;Manual techniques;Taping;Dry needling;Electrical Stimulation;Moist Heat;Cryotherapy;Iontophoresis 4mg /ml Dexamethasone  Consulted and Agree with Plan of Care  Patient       Patient will benefit from skilled therapeutic intervention in order to improve the following deficits and impairments:  Pain, Postural dysfunction, Impaired flexibility, Decreased range of motion, Decreased strength, Increased muscle spasms, Impaired UE functional use, Decreased activity tolerance, Decreased endurance  Visit Diagnosis: Chronic right shoulder pain  Chronic left shoulder pain  Stiffness of right shoulder, not elsewhere classified  Stiffness of left shoulder, not elsewhere classified  Abnormal posture     Problem List Patient Active Problem List   Diagnosis Date Noted  . Rotator cuff syndrome of both shoulders 07/28/2017  . Pulmonary embolism ([place]) 01/29/2016  . Liver lesion 01/03/2016  . Family history of breast cancer   . Family history of ovarian cancer   . Family history of prostate cancer   . Family history of colon cancer   . Skin cancer   . Cancer ([place])   . Genetic testing 11/20/2015  . Ganglion cyst of flexor tendon sheath of finger of left hand 11/19/2015  . Breast cancer of lower-outer quadrant of right female breast ([place]) 09/22/2015  . 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 Peroneus longus tear 11/24/2013  . Toe swelling 09/06/2013  . Melanoma ([place]) 06/20/2011  . Hodgkin's lymphoma ([place]) 06/20/2011  . Migraine 06/20/2011  . Malignant neoplasm of thyroid gland ([employer]) 06/20/2011  . Hyperglycemia 06/20/2011  . Hirsutism 06/20/2011    Bess Harvest, PTA 09/09/17 12:43 PM  [facility] [address]  [place], [place], [address] Phone: [phone]   Fax:  [phone]  Name: ASHANNA HEINSOHN MRN: [id number] Date of Birth: 03-Jun-1971

## 2017-09-09 ENCOUNTER — Ambulatory Visit: Payer: BLUE CROSS/BLUE SHIELD

## 2017-09-14 ENCOUNTER — Ambulatory Visit: Payer: BLUE CROSS/BLUE SHIELD | Attending: Sports Medicine

## 2017-09-14 DIAGNOSIS — M25512 Pain in left shoulder: Secondary | ICD-10-CM | POA: Insufficient documentation

## 2017-09-14 DIAGNOSIS — M25611 Stiffness of right shoulder, not elsewhere classified: Secondary | ICD-10-CM | POA: Diagnosis present

## 2017-09-14 DIAGNOSIS — M25511 Pain in right shoulder: Secondary | ICD-10-CM | POA: Insufficient documentation

## 2017-09-14 DIAGNOSIS — G8929 Other chronic pain: Secondary | ICD-10-CM

## 2017-09-14 DIAGNOSIS — R293 Abnormal posture: Secondary | ICD-10-CM | POA: Insufficient documentation

## 2017-09-14 DIAGNOSIS — M25612 Stiffness of left shoulder, not elsewhere classified: Secondary | ICD-10-CM | POA: Diagnosis present

## 2017-09-14 NOTE — Therapy (Signed)
[facility] [address]  [place], [place], [address] Phone: [phone]   Fax:  [phone]  Physical Therapy Treatment  Patient Details  Name: Stacy Chung MRN: [id number] Date of Birth: 12-04-1970 Referring Provider: Aundria Mems, MD   Encounter Date: 09/14/2017  PT End of Session - 09/14/17 1313    Visit Number  7    Number of Visits  12    Date for PT Re-Evaluation  10/02/17    Authorization Type  BCBS [place];  Billing codes not covered: 56387 (Self-care) & 56433 (PPT)    PT Start Time  1309    PT Stop Time  1354    PT Time Calculation (min)  45 min    Activity Tolerance  Patient tolerated treatment well    Behavior During Therapy  [facility] for tasks assessed/performed       Past Medical History:  Diagnosis Date  . Allergy   . Anxiety   . Breast cancer ([place])   . 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 Cancer ([place]) 1986   Hodgkins lymphoma  . Carcinoma of thyroid ([place])   . Depression   . Family history of breast cancer   . Family history of colon cancer   . Family history of ovarian cancer   . Family history of prostate cancer   . GERD (gastroesophageal reflux disease)   . History of chicken pox   . Hodgkin's disease   . Hx: UTI (urinary tract infection)   . Increased frequency of headaches   . Left breast mass   . Migraines   . PONV (postoperative nausea and vomiting)   . Pulmonary embolism ([place]) 11/30/2015  . Skin cancer 2003   Melanoma  . Thyroid disease     Past Surgical History:  Procedure Laterality Date  . FOOT TENDON SURGERY Left   . LAPAROSCOPIC SPLENECTOMY  1987   Hodgkins Disease  . THORACOTOMY  1991   collasped lung, spontaneous  . THYROIDECTOMY  2004   nodules, carcinoma  . URETEROPLASTY  1977   malformed ureter    There were no vitals filed for this visit.  Subjective Assessment - 09/14/17 1310    Subjective  Pt. reporting onset of "searing" pain at  L lower, lateral ribs with reaching movements on Saturday which lasted ~ 10 sec without known trigger.  Has had this short-lasting "searing" pain in same area x four over weekend while reaching/carrying items.       Pertinent History  B mastectomies with reconstruction (+ R lymph node resection) s/p R breast cancer 08/2015    Patient Stated Goals  "To be able to use my arms normally"    Currently in Pain?  No/denies    Pain Score  0-No pain up to 10/10 "searing pain" at L lateral lower ribs in short-lasting manner occasionally without known trigger with "reaching" activities    Pain Location  Rib cage    Pain Orientation  Left;Lower;Anterior;Lateral    Pain Descriptors / Indicators  Spasm "searing"    Pain Type  Acute pain    Pain Onset  In the past 7 days    Pain Frequency  Intermittent    Aggravating Factors   reaching, carrying heavy groceries with L UE in grocery store    Pain Relieving Factors  unsure     Multiple Pain Sites  No  [facility] Adult PT Treatment/Exercise - 09/14/17 1332      Shoulder Exercises: Prone   Extension  Both;15 reps    Extension Weight (lbs)  1    Extension Limitations  prone "I" leaning over green p-ball     Horizontal ABduction 1  Both;15 reps    Horizontal ABduction 1 Weight (lbs)  1    Horizontal ABduction 1 Limitations  prone "T" leaning over green p-ball     Horizontal ABduction 2  Both;10 reps    Horizontal ABduction 2 Weight (lbs)  1    Horizontal ABduction 2 Limitations  "Y" leaning over orange p-ball on mat table     Other Prone Exercises  Quadruped alternating UE/LE raise x 10 reps       Shoulder Exercises: Standing   External Rotation  15 reps;Right;Left    Theraband Level (Shoulder External Rotation)  Level 2 (Red)    Internal Rotation  Right;Left;15 reps;Theraband    Theraband Level (Shoulder Internal Rotation)  Level 2 (Red)    Row  15 reps    Theraband Level (Shoulder Row)  Level 3 (Green)      Shoulder  Exercises: ROM/Strengthening   UBE (Upper Arm Bike)  L 3.0 fwd/back x3' each    Other ROM/Strengthening Exercises  BATCA pulldown 15# x 10 reps  Cues required for scapular retraction/depression       Shoulder Exercises: Stretch   Corner Stretch  30 seconds;1 rep    Corner Stretch Limitations  low/mid way doorway stretch     Other Shoulder Stretches  L lats stretch in R prayer stretch position x 30 sec       Manual Therapy   Manual Therapy  Soft tissue mobilization;Taping    Manual therapy comments  R sidelying     Soft tissue mobilization  STM to L lower lateral ribs; pt. 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 for row HEP     Person(s) Educated  Patient    Methods  Explanation;Demonstration;Verbal cues;Handout    Comprehension  Verbalized understanding;Returned demonstration;Verbal cues required;Need further instruction       PT Short Term Goals - 09/14/17 1314      PT SHORT TERM GOAL #1   Title  Independent with initial HEP    Status  Achieved      PT SHORT TERM GOAL #2   Title  Pt will verbalize/demonstrate understanding of neutral spine and shoulder posture to promote improved glenohumeral & scapular kinematics    Status  Achieved        PT Long Term Goals - 08/19/17 1407      PT LONG TERM GOAL #1   Title  Independent with ongoing/advanced HEP    Status  On-going      PT LONG TERM GOAL #2   Title  B shoulder AROM WFL w/o increased pain     Status  On-going      PT LONG TERM GOAL #3   Title  B shoulder strength >/= 4+/5 w/o pain on resistance    Status  On-going      PT LONG TERM GOAL #4  Title  Pt will report ability to complete sustained overhead activity for >/= 7 minutes w/o residual pain or fatigue  to allow her to dry her hair with a hair dryer    Status  On-going            Plan - 09/14/17 1314    Clinical Impression Statement  Pt. reporting onset of "searing" pain at L lower/lateral ribs, which lasted ~ 10 sec in duration following reaching activity on Saturday without known trigger.  Pt. feels this may have been a "muscle spasm".  Had typical muscular soreness following last visit "around shoulder blades" following last visit.  STM to L lower/lateral ribs today with pt. TTP however tenderness improved following manual work to this area.  Trial of K-taping applied at L ribs over area of tenderness for hopeful reduction in pain/tone.  Pt. tolerated all scapular strengthening and progression of I's, T's, and Y's in today's visit well without pain.  Will monitor response to k-taping and continue to progress toward goals in coming visits.    PT Treatment/Interventions  Patient/family education;Neuromuscular re-education;Therapeutic exercise;Therapeutic activities;Manual techniques;Taping;Dry needling;Electrical Stimulation;Moist Heat;Cryotherapy;Iontophoresis 4mg /ml Dexamethasone    Consulted and Agree with Plan of Care  Patient       Patient will benefit from skilled therapeutic intervention in order to improve the following deficits and impairments:  Pain, Postural dysfunction, Impaired flexibility, Decreased range of motion, Decreased strength, Increased muscle spasms, Impaired UE functional use, Decreased activity tolerance, Decreased endurance  Visit Diagnosis: Chronic right shoulder pain  Chronic left shoulder pain  Stiffness of right shoulder, not elsewhere classified  Stiffness of left shoulder, not elsewhere classified  Abnormal posture     Problem List Patient Active Problem List   Diagnosis Date Noted  . Rotator cuff syndrome of both shoulders 07/28/2017  . Pulmonary embolism ([place]) 01/29/2016  . Liver lesion 01/03/2016  . Family history of breast cancer   .  Family history of ovarian cancer   . Family history of prostate cancer   . Family history of colon cancer   . Skin cancer   . Cancer ([place])   . Genetic testing 11/20/2015  . Ganglion cyst of flexor tendon sheath of finger of left hand 11/19/2015  . Breast cancer of lower-outer quadrant of right female breast ([place]) 09/22/2015  . 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 Peroneus longus tear 11/24/2013  . Toe swelling 09/06/2013  . Melanoma ([place]) 06/20/2011  . Hodgkin's lymphoma ([place]) 06/20/2011  . Migraine 06/20/2011  . Malignant neoplasm of thyroid gland ([place]) 06/20/2011  . Hyperglycemia 06/20/2011  . Hirsutism 06/20/2011    Bess Harvest, PTA 09/14/17 2:24 PM   [facility] [address]  [place], [place], [address] Phone: [phone]   Fax:  [phone]  Name: AUDRIA TAKESHITA MRN: [id number] Date of Birth: 01-Jun-1971

## 2017-09-16 ENCOUNTER — Ambulatory Visit: Payer: BLUE CROSS/BLUE SHIELD

## 2017-09-16 DIAGNOSIS — M25512 Pain in left shoulder: Secondary | ICD-10-CM

## 2017-09-16 DIAGNOSIS — M25511 Pain in right shoulder: Principal | ICD-10-CM

## 2017-09-16 DIAGNOSIS — M25612 Stiffness of left shoulder, not elsewhere classified: Secondary | ICD-10-CM

## 2017-09-16 DIAGNOSIS — G8929 Other chronic pain: Secondary | ICD-10-CM | POA: Diagnosis not present

## 2017-09-16 DIAGNOSIS — R293 Abnormal posture: Secondary | ICD-10-CM | POA: Diagnosis not present

## 2017-09-16 DIAGNOSIS — M25611 Stiffness of right shoulder, not elsewhere classified: Secondary | ICD-10-CM | POA: Diagnosis not present

## 2017-09-16 NOTE — Therapy (Signed)
[facility] [address]  [place], [place], [address] Phone: [phone]   Fax:  [phone]  Physical Therapy Treatment  Patient Details  Name: Stacy Chung MRN: [id number] Date of Birth: 1970-08-14 Referring Provider: Aundria Mems, MD   Encounter Date: 09/16/2017  PT End of Session - 09/16/17 1328    Visit Number  8    Number of Visits  12    Date for PT Re-Evaluation  10/02/17    Authorization Type  BCBS - [place];  Billing codes not covered: 03546 (Self-care) & 56812 (PPT)    PT Start Time  7517    PT Stop Time  1405    PT Time Calculation (min)  48 min    Activity Tolerance  Patient tolerated treatment well    Behavior During Therapy  [facility] for tasks assessed/performed       Past Medical History:  Diagnosis Date  . Allergy   . Anxiety   . Breast cancer ([place])   . 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 Cancer ([place]) 1986   Hodgkins lymphoma  . Carcinoma of thyroid ([place])   . Depression   . Family history of breast cancer   . Family history of colon cancer   . Family history of ovarian cancer   . Family history of prostate cancer   . GERD (gastroesophageal reflux disease)   . History of chicken pox   . Hodgkin's disease   . Hx: UTI (urinary tract infection)   . Increased frequency of headaches   . Left breast mass   . Migraines   . PONV (postoperative nausea and vomiting)   . Pulmonary embolism ([place]) 11/30/2015  . Skin cancer 2003   Melanoma  . Thyroid disease     Past Surgical History:  Procedure Laterality Date  . FOOT TENDON SURGERY Left   . LAPAROSCOPIC SPLENECTOMY  1987   Hodgkins Disease  . THORACOTOMY  1991   collasped lung, spontaneous  . THYROIDECTOMY  2004   nodules, carcinoma  . URETEROPLASTY  1977   malformed ureter    There were no vitals filed for this visit.  Subjective Assessment - 09/16/17 1321    Subjective  Pt. reporting onset of  "intense",  "searing" pain multiple times over last few days originating at L lower ribs and extending around to abdomen which lasts 10-20 sec in duration with complete release following.  Pt. unsure of triggering event however feels this pain may be associated with "chest stretching" performed last visit.     Pertinent History  B mastectomies with reconstruction (+ R lymph node resection) s/p R breast cancer 08/2015    Patient Stated Goals  "To be able to use my arms normally"    Currently in Pain?  No/denies    Pain Score  0-No pain up to 10/10 at worst     Pain Location  Rib cage    Pain Orientation  Left;Lower;Anterior;Lateral    Pain Descriptors / Indicators  Spasm    Pain Type  Acute pain    Pain Frequency  Intermittent    Aggravating Factors   reaching, carrying heavy groceries     Pain Relieving Factors  unsure     Multiple Pain Sites  No                      [facility] Adult PT Treatment/Exercise - 09/16/17 1341  Shoulder Exercises: Supine   Protraction  Both;15 reps;Strengthening;Weights started set with 2# then progressed to 3# due to pt. ease     Protraction Weight (lbs)  3      Shoulder Exercises: Prone   Extension  Both;15 reps    Extension Weight (lbs)  2    Extension Limitations  prone "I" leaning over green p-ball     Horizontal ABduction 1  Both;15 reps    Horizontal ABduction 1 Weight (lbs)  1    Horizontal ABduction 1 Limitations  prone "T" leaning over green p-ball     Horizontal ABduction 2  Both;15 reps    Horizontal ABduction 2 Weight (lbs)  1    Horizontal ABduction 2 Limitations  "Y" leaning over orange p-ball on mat table       Shoulder Exercises: ROM/Strengthening   UBE (Upper Arm Bike)  L 3.0 fwd/back x3' each    Cybex Row  15 reps 2 sets     Cybex Row Limitations  15# - low and mid    Other ROM/Strengthening Exercises  BATCA pulldown 15# x 15 reps       Shoulder Exercises: Stretch   Other Shoulder Stretches  R sidelying L intercostals stretch and  L LE extending off table 2 x 30 sec  with L shoulder in flexed postion overhead       Manual Therapy   Manual Therapy  Soft tissue mobilization;Taping    Manual therapy comments  R sidelying     Soft tissue mobilization  STM to L lower lateral intercostals; pt. with only mild ttp     Kinesiotex  IT sales professional  L lateral/lower ribs: "rib dysfunction" taping pattern - option 2: "I - strip" extending from superior to point of tenderness anterior and inferior to point of tenderness (20% stretch), 2-cross strips over point of tenderness (20% stretch)                PT Short Term Goals - 09/14/17 1314      PT SHORT TERM GOAL #1   Title  Independent with initial HEP    Status  Achieved      PT SHORT TERM GOAL #2   Title  Pt will verbalize/demonstrate understanding of neutral spine and shoulder posture to promote improved glenohumeral & scapular kinematics    Status  Achieved        PT Long Term Goals - 08/19/17 1407      PT LONG TERM GOAL #1   Title  Independent with ongoing/advanced HEP    Status  On-going      PT LONG TERM GOAL #2   Title  B shoulder AROM WFL w/o increased pain     Status  On-going      PT LONG TERM GOAL #3   Title  B shoulder strength >/= 4+/5 w/o pain on resistance    Status  On-going      PT LONG TERM GOAL #4   Title  Pt will report ability to complete sustained overhead activity for >/= 7 minutes w/o residual pain or fatigue to allow her to dry her hair with a hair dryer    Status  On-going            Plan - 09/16/17 1328    Clinical Impression Statement  Pt. reporting pain at L lower ribs returning x three during evening after last visit lasting ~ 10-20 sec  in  duration.  Pt. describing this pain as "intense", "searing" pain originating at L lower ribs and extending around to abdomen and unsure of triggering event and notes feeling this while sitting in car, and while holding groceries.  Pt. with only mild TTP  in L anterior/lateral intercostal area however feels taping applied last visit may have helped comfort in this area.  Pt. tolerated all scapular strengthening today in treatment well without onset of rib pain.  Ended treatment with application of alternate taping pattern to L-sided ribs over area of tenderness.  Will monitor response to this at upcoming visit and continue to progress toward goals.      PT Treatment/Interventions  Patient/family education;Neuromuscular re-education;Therapeutic exercise;Therapeutic activities;Manual techniques;Taping;Dry needling;Electrical Stimulation;Moist Heat;Cryotherapy;Iontophoresis 4mg /ml Dexamethasone    Consulted and Agree with Plan of Care  Patient       Patient will benefit from skilled therapeutic intervention in order to improve the following deficits and impairments:  Pain, Postural dysfunction, Impaired flexibility, Decreased range of motion, Decreased strength, Increased muscle spasms, Impaired UE functional use, Decreased activity tolerance, Decreased endurance  Visit Diagnosis: Chronic right shoulder pain  Chronic left shoulder pain  Stiffness of right shoulder, not elsewhere classified  Stiffness of left shoulder, not elsewhere classified  Abnormal posture     Problem List Patient Active Problem List   Diagnosis Date Noted  . Rotator cuff syndrome of both shoulders 07/28/2017  . Pulmonary embolism ([place]) 01/29/2016  . Liver lesion 01/03/2016  . Family history of breast cancer   . Family history of ovarian cancer   . Family history of prostate cancer   . Family history of colon cancer   . Skin cancer   . Cancer ([place])   . Genetic testing 11/20/2015  . Ganglion cyst of flexor tendon sheath of finger of left hand 11/19/2015  . Breast cancer of lower-outer quadrant of right female breast ([place]) 09/22/2015  . 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 Peroneus longus tear 11/24/2013  . Toe swelling 09/06/2013   . Melanoma ([place]) 06/20/2011  . Hodgkin's lymphoma ([place]) 06/20/2011  . Migraine 06/20/2011  . Malignant neoplasm of thyroid gland ([place]) 06/20/2011  . Hyperglycemia 06/20/2011  . Hirsutism 06/20/2011   Bess Harvest, PTA 09/16/17 2:33 PM  [facility] [address]  [place], [place], [address] Phone: [phone]   Fax:  [phone]  Name: Stacy Chung MRN: [id number] Date of Birth: [birth year]

## 2017-09-21 ENCOUNTER — Telehealth: Payer: Self-pay

## 2017-09-21 ENCOUNTER — Telehealth: Payer: Self-pay | Admitting: Hematology and Oncology

## 2017-09-21 ENCOUNTER — Encounter: Payer: Self-pay | Admitting: Adult Health

## 2017-09-21 ENCOUNTER — Inpatient Hospital Stay: Payer: BLUE CROSS/BLUE SHIELD | Attending: Hematology and Oncology | Admitting: Adult Health

## 2017-09-21 VITALS — BP 115/58 | HR 89 | Temp 98.0°F | Resp 17 | Ht 65.0 in | Wt 173.1 lb

## 2017-09-21 DIAGNOSIS — Z86718 Personal history of other venous thrombosis and embolism: Secondary | ICD-10-CM | POA: Diagnosis not present

## 2017-09-21 DIAGNOSIS — R232 Flushing: Secondary | ICD-10-CM | POA: Diagnosis not present

## 2017-09-21 DIAGNOSIS — R413 Other amnesia: Secondary | ICD-10-CM | POA: Diagnosis not present

## 2017-09-21 DIAGNOSIS — R6882 Decreased libido: Secondary | ICD-10-CM | POA: Insufficient documentation

## 2017-09-21 DIAGNOSIS — Z79811 Long term (current) use of aromatase inhibitors: Secondary | ICD-10-CM | POA: Insufficient documentation

## 2017-09-21 DIAGNOSIS — Z170 Estrogen receptor positive status [ER+]: Secondary | ICD-10-CM | POA: Insufficient documentation

## 2017-09-21 DIAGNOSIS — Z8572 Personal history of non-Hodgkin lymphomas: Secondary | ICD-10-CM | POA: Diagnosis not present

## 2017-09-21 DIAGNOSIS — Z86711 Personal history of pulmonary embolism: Secondary | ICD-10-CM | POA: Insufficient documentation

## 2017-09-21 DIAGNOSIS — Z853 Personal history of malignant neoplasm of breast: Secondary | ICD-10-CM | POA: Insufficient documentation

## 2017-09-21 DIAGNOSIS — Z85850 Personal history of malignant neoplasm of thyroid: Secondary | ICD-10-CM | POA: Insufficient documentation

## 2017-09-21 DIAGNOSIS — F418 Other specified anxiety disorders: Secondary | ICD-10-CM | POA: Insufficient documentation

## 2017-09-21 DIAGNOSIS — Z85820 Personal history of malignant melanoma of skin: Secondary | ICD-10-CM | POA: Insufficient documentation

## 2017-09-21 DIAGNOSIS — Z800 Family history of malignant neoplasm of digestive organs: Secondary | ICD-10-CM | POA: Diagnosis not present

## 2017-09-21 DIAGNOSIS — E2839 Other primary ovarian failure: Secondary | ICD-10-CM

## 2017-09-21 DIAGNOSIS — Z8041 Family history of malignant neoplasm of ovary: Secondary | ICD-10-CM | POA: Diagnosis not present

## 2017-09-21 DIAGNOSIS — N898 Other specified noninflammatory disorders of vagina: Secondary | ICD-10-CM

## 2017-09-21 DIAGNOSIS — C50511 Malignant neoplasm of lower-outer quadrant of right female breast: Secondary | ICD-10-CM | POA: Insufficient documentation

## 2017-09-21 MED ORDER — PRASTERONE 6.5 MG VA INST: 1.0000 "application " | VAGINAL_INSERT | Freq: Every day | VAGINAL | 2 refills | Status: DC

## 2017-09-21 MED ORDER — ANASTROZOLE 1 MG PO TABS: ORAL_TABLET | ORAL | 3 refills | Status: DC

## 2017-09-21 MED ORDER — BUPROPION HCL ER (XL) 450 MG PO TB24: 300.0000 mg | ORAL_TABLET | Freq: Every day | ORAL | 2 refills | Status: DC

## 2017-09-21 NOTE — Progress Notes (Signed)
[address] [facility] Follow up:    Tisovec, [name], MD [place] [address]   DIAGNOSIS: Cancer Staging Breast cancer of lower-outer quadrant of right female breast [facility]) Staging form: Breast, AJCC 7th Edition - Clinical stage from 07/25/2015: Stage IA (T1b, N0, M0) - Unsigned - Pathologic stage from 08/28/2015: Stage IA (T1a, N0, cM0) - Unsigned   SUMMARY OF ONCOLOGIC HISTORY:   Hodgkin's lymphoma ([place])   06/21/1979 Initial Diagnosis    Stage III Hodgkin's disease with cervical, retroperitoneal lymph nodes, treated with ABVD-MOPP (alternating) followed by chest and abdominal radiation at [facility] with complete response      09/23/2001 Pathology Results    Melanoma stage I on the back treated with resection      09/27/2002 Pathology Results    Thyroid cancer treated with thyroidectomy       Breast cancer of lower-outer quadrant of right female breast ([place])   07/18/2015 Breast MRI    Rt breast mass 0.9 x 0.8 x 0.8 cm, with clumped NME 4 cm, 2 sites of Bx proven flat epithelial atypia      07/25/2015 Initial Diagnosis    Rt Breast Biopsy: IDC with calcs and DCIS and ALH, Er 100%, PR 100%, Ki 67 5%, Her 2 Neg Ratio 1.35      07/25/2015 Clinical Stage    Stage IA: T1b N0      07/25/2015 Oncotype testing    RS 6 (5% ROR)      07/26/2015 Procedure    Genetic testing revealed BRIP-1      08/28/2015 Surgery    Bilateral mastectomies with reconstruction in [place] at [facility] for restorative surgery (Dr.Delacroce): Residual high-grade DCIS 1.5 cm, 0/2 sentinel nodes      09/24/2015 - 11/29/2015 Anti-estrogen oral therapy    Tamoxifen 20 mg daily. Stopped May 2017 for pulmonary embolism      10/31/2015 Survivorship    Care plan completed and mailed to patient in lieu of in person visit at her request      11/20/2015 - 11/23/2015 Hospital Admission    Hospitalization for pulmonary embolism      05/15/2016 Surgery    Hysterectomy and  BSO      07/23/2016 -  Anti-estrogen oral therapy    Letrozole 2.5 mg daily switched to anastrozole 03/23/2017 due to hot flashes and myalgias      03/17/2017 PET scan    No evidence of hypermetabolic recurrent or metastatic disease. Decrease in left hepatic lobe hypermetabolism, corresponding to a benign abnormality on prior MRI ; likely focal fat; Resolved left-sided pleural effusion       CURRENT THERAPY: Anastrozole  INTERVAL HISTORY: Stacy Chung 47 y.o. female returns for evaluation of her history of breast cancer.  She is taking Anastrozole daily.  She was having difficulty with Letrozole, however is much better with the change.  She does have some short term memory issues.  She has mild hot flashes compared to how she was doing previously.  She has noticed some skin changes, so she started taking Collagen.  She has some vaginal dryness and is using intrarosa daily.  This is helping for her dryness.  She also has some decreased libido, and increased moodiness. She is taking Wellbutrin 331m per day and would like to increase.  Stacy Chung concerned because she thinks that she may have lost some height.  Her husband has noticed that she may be getting shorter and that he has to  look further down.  She was measured at 5'5 last year and 5'4 this year.    Stacy Chung sees her PCP regularly, and had a physical with lab work recently.  She exercises somewhat.  She is working on a plan with physical therapy.  She had rotator cuff injury and was undergoing PT, but they found some issues with her pec muscles that they had to work on first.     Patient Active Problem List   Diagnosis Date Noted  . Rotator cuff syndrome of both shoulders 07/28/2017  . Pulmonary embolism (HCC) 01/29/2016  . Liver lesion 01/03/2016  . Family history of breast cancer   . Family history of ovarian cancer   . Family history of prostate cancer   . Family history of colon cancer   . Skin cancer   . Cancer (HCC)   .  Genetic testing 11/20/2015  . Ganglion cyst of flexor tendon sheath of finger of left hand 11/19/2015  . Breast cancer of lower-outer quadrant of right female breast (HCC) 09/22/2015  . 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 Peroneus longus tear 11/24/2013  . Toe swelling 09/06/2013  . Melanoma (HCC) 06/20/2011  . Hodgkin's lymphoma (HCC) 06/20/2011  . Migraine 06/20/2011  . Malignant neoplasm of thyroid gland (HCC) 06/20/2011  . Hyperglycemia 06/20/2011  . Hirsutism 06/20/2011    is allergic to other; macrodantin; pertussis vaccines; and tape.  MEDICAL HISTORY: Past Medical History:  Diagnosis Date  . Allergy   . Anxiety   . Breast cancer (HCC)   . 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 Cancer (HCC) 1986   Hodgkins lymphoma  . Carcinoma of thyroid (HCC)   . Depression   . Family history of breast cancer   . Family history of colon cancer   . Family history of ovarian cancer   . Family history of prostate cancer   . GERD (gastroesophageal reflux disease)   . History of chicken pox   . Hodgkin's disease   . Hx: UTI (urinary tract infection)   . Increased frequency of headaches   . Left breast mass   . Migraines   . PONV (postoperative nausea and vomiting)   . Pulmonary embolism (HCC) 11/30/2015  . Skin cancer 2003   Melanoma  . Thyroid disease     SURGICAL HISTORY: Past Surgical History:  Procedure Laterality Date  . FOOT TENDON SURGERY Left   . LAPAROSCOPIC SPLENECTOMY  1987   Hodgkins Disease  . THORACOTOMY  1991   collasped lung, spontaneous  . THYROIDECTOMY  2004   nodules, carcinoma  . URETEROPLASTY  1977   malformed ureter    SOCIAL HISTORY: Social History   Socioeconomic History  . Marital status: Married    Spouse name: Not on file  . Number of children: 2  . Years of education: 16  . Highest education level: Not on file  Social Needs  . Financial resource strain: Not on file  . Food insecurity -  worry: Not on file  . Food insecurity - inability: Not on file  . Transportation needs - medical: Not on file  . Transportation needs - non-medical: Not on file  Occupational History  . Occupation: Pharmaceutical Sales  Tobacco Use  . Smoking status: Never Smoker  . Smokeless tobacco: Never Used  Substance and Sexual Activity  . Alcohol use: Yes    Comment: social  . Drug use: No  . Sexual activity: Yes      Birth control/protection: IUD  Other Topics Concern  . Not on file  Social History Narrative   Regular exercise-no    FAMILY HISTORY: Family History  Problem Relation Age of Onset  . Alcohol abuse Maternal Aunt   . Prostate cancer Maternal Uncle 27  . Colon cancer Maternal Grandmother        dx in her 54s  . Prostate cancer Maternal Grandfather        dx in his 27s  . Cancer Other        FH of Breast Cancer, Ovarian/Uterine Cancer  . Heart disease Maternal Uncle        31s  . Breast cancer Other        MGFs sister - reportedly BRCA+  . Ovarian cancer Other        PGFs mother  . Ovarian cancer Other        PGFs maternal aunt  . Ovarian cancer Other        PGFs maternal grandmohter  . Breast cancer Other        MGMs sister  . Breast cancer Other        MGFs mother    Review of Systems  Constitutional: Negative for appetite change, chills, fatigue, fever and unexpected weight change.  HENT:   Negative for hearing loss and lump/mass.   Eyes: Negative for eye problems and icterus.  Respiratory: Negative for chest tightness, cough and shortness of breath.   Cardiovascular: Negative for chest pain, leg swelling and palpitations.  Gastrointestinal: Negative for abdominal distention, abdominal pain, constipation, diarrhea, nausea and vomiting.  Endocrine: Negative for hot flashes.  Skin: Negative for itching and rash.  Neurological: Negative for dizziness, extremity weakness, headaches and numbness.      PHYSICAL EXAMINATION  ECOG PERFORMANCE STATUS: 1 -  Symptomatic but completely ambulatory  Vitals:   09/21/17 0924  BP: (!) 115/58  Pulse: 89  Resp: 17  Temp: 98 F (36.7 C)  SpO2: 100%    Physical Exam  Constitutional: She is oriented to person, place, and time and well-developed, well-nourished, and in no distress.  HENT:  Head: Normocephalic and atraumatic.  Mouth/Throat: Oropharynx is clear and moist. No oropharyngeal exudate.  Eyes: Pupils are equal, round, and reactive to light. No scleral icterus.  Neck: Neck supple.  Cardiovascular: Normal rate, regular rhythm and normal heart sounds.  Pulmonary/Chest: Effort normal and breath sounds normal. No respiratory distress. She has no wheezes. She has no rales.  Breasts are s/p bilateral mastectomies, no nodules, no masses, no skin or nipple changes.  Abdominal: Soft. Bowel sounds are normal. She exhibits no distension and no mass. There is no tenderness. There is no rebound and no guarding.  Musculoskeletal: She exhibits no edema.  Lymphadenopathy:    She has no cervical adenopathy.  Neurological: She is alert and oriented to person, place, and time.  Skin: Skin is warm and dry. No rash noted. No erythema.  Psychiatric: Mood and affect normal.    LABORATORY DATA:  CBC    Component Value Date/Time   WBC 7.6 03/17/2017 1225   WBC 6.9 12/20/2015 1120   RBC 4.01 03/17/2017 1225   RBC 4.18 12/20/2015 1120   HGB 12.3 03/17/2017 1225   HCT 36.3 03/17/2017 1225   PLT 394 03/17/2017 1225   MCV 90.6 03/17/2017 1225   MCH 30.8 03/17/2017 1225   MCH 30.6 12/20/2015 1120   MCHC 34.0 03/17/2017 1225   MCHC 34.3 12/20/2015 1120   RDW 13.6  03/17/2017 1225   LYMPHSABS 2.5 03/17/2017 1225   MONOABS 0.7 03/17/2017 1225   EOSABS 0.2 03/17/2017 1225   BASOSABS 0.1 03/17/2017 1225    CMP     Component Value Date/Time   NA 141 03/17/2017 1225   K 3.6 03/17/2017 1225   CL 109 12/20/2015 1120   CO2 25 03/17/2017 1225   GLUCOSE 91 03/17/2017 1225   BUN 15.9 03/17/2017 1225    CREATININE 0.8 03/17/2017 1225   CALCIUM 9.7 03/17/2017 1225   PROT 7.6 03/17/2017 1225   ALBUMIN 3.8 03/17/2017 1225   AST 20 03/17/2017 1225   ALT 23 03/17/2017 1225   ALKPHOS 92 03/17/2017 1225   BILITOT <0.22 03/17/2017 1225   GFRNONAA >60 12/20/2015 1120   GFRAA >60 12/20/2015 1120      ASSESSMENT and PLAN:   Breast cancer of lower-outer quadrant of right female breast ([place]) Rt Breast mass 07/18/15: MRI breast: 0.9 x 0.8 x 0.8 cm, with clumped NME 4 cm, 2 sites of Bx proven flat epithelial atypia, T1bN0 (stage 1A) Rt Breast Biopsy 07/25/15: IDC with calcs and DCIS and ALH, Er 100%, PR 100%, Ki 67 5%, Her 2 Neg Ratio 1.35 Bilateral mastectomies 08/28/2015: In [place], 1.5 cm residual high-grade DCIS Tis N0 stage 0 Final pathologic staging: T1a N0 stage IA DX recurrence score 6, 5% risk of recurrence with tamoxifen Liver biopsy: 12/20/15: Benign  BRIP-1: This increases her risk of breast and ovarian cancer. (Patient to have oophorectomy in [place] and of June)  MRI Abd 03/20/17: Stable benign focal nodular hyperplasia in the right hepatic lobe. No evidence of metastatic disease or other acute findings. MRI Breast 03/17/17: No MR evidence of breast malignancy PET 9/4/18No evidence of hypermetabolic recurrent or metastatic disease. Decrease in left hepatic lobe hypermetabolism, corresponding to a benign abnormality on prior MRI ; likely focal fat; Resolved left-sided pleural effusion  ___________________________________________________________________________  Current treatment: Tamoxifen 20 mg daily stopped due to pulmonary embolism. Started letrozole 06/26/2016, then changed to Anastrozole  Dung is tolerating Anastrozole much better than she did the Letrozole.  She will continue this.  I increased her Wellbutrin to 423m daily and I also refilled her intrarosa for her today.  She noted a different feeling in her right axilla.  I palpate no difference or adenopathy.  She will  let me know if it continues or worsens, and I will order CT chest.  She will return in one year for follow up with Dr. GLindi Adie      Orders Placed This Encounter  Procedures  . DG Bone Density    Standing Status:   Future    Standing Expiration Date:   09/22/2018    Order Specific Question:   Reason for Exam (SYMPTOM  OR DIAGNOSIS REQUIRED)    Answer:   estrogen deficiency    Order Specific Question:   [facility]    [facility]   Order Specific Question:   Is the patient pregnant?    Answer:   No    All questions were answered. The patient knows to call the clinic with any problems, questions or concerns. We can certainly see the patient much sooner if necessary.  A total of (30) minutes of face-to-face time was spent with this patient with greater than 50% of that time in counseling and care-coordination.  This note was electronically signed. LScot Dock NP 09/21/2017

## 2017-09-21 NOTE — Telephone Encounter (Signed)
[place] to have them call pt to schedule BD around her schedule.  [facility] to call pt.

## 2017-09-21 NOTE — Assessment & Plan Note (Signed)
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 (Patient to have oophorectomy in [place] and of June)  MRI Abd 03/20/17: Stable benign focal nodular hyperplasia in the right hepatic lobe. No evidence of metastatic disease or other acute findings. MRI Breast 03/17/17: No MR evidence of breast malignancy PET 9/4/18No evidence of hypermetabolic recurrent or metastatic disease. Decrease in left hepatic lobe hypermetabolism, corresponding to a benign abnormality on prior MRI ; likely focal fat; Resolved left-sided pleural effusion  ___________________________________________________________________________  Current treatment: Tamoxifen 20 mg daily stopped due to pulmonary embolism. Started letrozole 06/26/2016, then changed to Anastrozole  Stacy Chung is tolerating Anastrozole much better than she did the Letrozole.  She will continue this.  I increased her Wellbutrin to 450mg  daily and I also refilled her intrarosa for her today.  She noted a different feeling in her right axilla.  I palpate no difference or adenopathy.  She will let me know if it continues or worsens, and I will order CT chest.  She will return in one year for follow up with Dr. Lindi Adie.

## 2017-09-21 NOTE — Telephone Encounter (Signed)
Gave avs and calendar for march 2020 °

## 2017-09-22 ENCOUNTER — Encounter: Payer: Self-pay | Admitting: Adult Health

## 2017-09-22 ENCOUNTER — Ambulatory Visit (INDEPENDENT_AMBULATORY_CARE_PROVIDER_SITE_OTHER): Payer: BLUE CROSS/BLUE SHIELD | Admitting: Sports Medicine

## 2017-09-22 ENCOUNTER — Encounter: Payer: Self-pay | Admitting: Sports Medicine

## 2017-09-22 ENCOUNTER — Ambulatory Visit: Payer: BLUE CROSS/BLUE SHIELD | Admitting: Physical Therapy

## 2017-09-22 DIAGNOSIS — Z170 Estrogen receptor positive status [ER+]: Secondary | ICD-10-CM | POA: Diagnosis not present

## 2017-09-22 DIAGNOSIS — C50511 Malignant neoplasm of lower-outer quadrant of right female breast: Secondary | ICD-10-CM

## 2017-09-22 DIAGNOSIS — M75101 Unspecified rotator cuff tear or rupture of right shoulder, not specified as traumatic: Secondary | ICD-10-CM

## 2017-09-22 DIAGNOSIS — M75102 Unspecified rotator cuff tear or rupture of left shoulder, not specified as traumatic: Secondary | ICD-10-CM | POA: Diagnosis not present

## 2017-09-22 MED ORDER — AMBULATORY NON FORMULARY MEDICATION: 0 refills | Status: AC

## 2017-09-22 NOTE — Assessment & Plan Note (Signed)
X-rays did show mild glenohumeral disease in the right side. We did a right bicipital sheath as well as subcoracoid injection. She is for the most part pain-free with both shoulders. Doing extremely well with physical therapy. Would like prescription for myofascial release and massage therapy.

## 2017-09-22 NOTE — Progress Notes (Signed)
Subjective:    CC: Follow-up  HPI: Shoulder pain: Doing much better, almost no pain, we did a right bicipital sheath and subcoracoid space injection, she is worked hard with aggressive physical therapy, and is feeling pretty good.  History of breast cancer: Post reconstruction, TRAM flap, currently on Arimidex, does need bone density test.  I reviewed the past medical history, family history, social history, surgical history, and allergies today and no changes were needed.  Please see the problem list section below in epic for further details.  Past Medical History: Past Medical History:  Diagnosis Date  . Allergy   . Anxiety   . Breast cancer ([place])   . Cancer [facility]) 2003   papillary thyroid cancer  . Cancer ([place]) 1986   Hodgkins lymphoma  . Carcinoma of thyroid ([place])   . Depression   . Family history of breast cancer   . Family history of colon cancer   . Family history of ovarian cancer   . Family history of prostate cancer   . GERD (gastroesophageal reflux disease)   . History of chicken pox   . Hodgkin's disease   . Hx: UTI (urinary tract infection)   . Increased frequency of headaches   . Left breast mass   . Migraines   . PONV (postoperative nausea and vomiting)   . Pulmonary embolism ([place]) 11/30/2015  . Skin cancer 2003   Melanoma  . Thyroid disease    Past Surgical History: Past Surgical History:  Procedure Laterality Date  . FOOT TENDON SURGERY Left   . LAPAROSCOPIC SPLENECTOMY  1987   Hodgkins Disease  . THORACOTOMY  1991   collasped lung, spontaneous  . THYROIDECTOMY  2004   nodules, carcinoma  . URETEROPLASTY  1977   malformed ureter   Social History: Social History   Socioeconomic History  . Marital status: Married    Spouse name: None  . Number of children: 2  . Years of education: 56  . Highest education level: None  Social Needs  . Financial resource strain: None  . Food insecurity - worry: None  . Food insecurity - inability: None  .  Transportation needs - medical: None  . Transportation needs - non-medical: None  Occupational History  . Occupation: Chief Strategy Officer  Tobacco Use  . Smoking status: Never Smoker  . Smokeless tobacco: Never Used  Substance and Sexual Activity  . Alcohol use: Yes    Comment: social  . Drug use: No  . Sexual activity: Yes    Birth control/protection: IUD  Other Topics Concern  . None  Social History Narrative   Regular exercise-no   Family History: Family History  Problem Relation Age of Onset  . Alcohol abuse Maternal Aunt   . Prostate cancer Maternal Uncle 51  . Colon cancer Maternal Grandmother        dx in her 93s  . Prostate cancer Maternal Grandfather        dx in his 71s  . Cancer Other        FH of Breast Cancer, Ovarian/Uterine Cancer  . Heart disease Maternal Uncle        89s  . Breast cancer Other        MGFs sister - reportedly BRCA+  . Ovarian cancer Other        PGFs mother  . Ovarian cancer Other        PGFs maternal aunt  . Ovarian cancer Other        PGFs  maternal grandmohter  . Breast cancer Other        MGMs sister  . Breast cancer Other        MGFs mother   Allergies: Allergies  Allergen Reactions  . Other Rash    "bandaides"  . Macrodantin   . Pertussis Vaccines   . Tape Rash    "plastic tape"   Medications: See med rec.  Review of Systems: No fevers, chills, night sweats, weight loss, chest pain, or shortness of breath.   Objective:    General: Well Developed, well nourished, and in no acute distress.  Neuro: Alert and oriented x3, extra-ocular muscles intact, sensation grossly intact.  HEENT: Normocephalic, atraumatic, pupils equal round reactive to light, neck supple, no masses, no lymphadenopathy, thyroid nonpalpable.  Skin: Warm and dry, no rashes. Cardiac: Regular rate and rhythm, no murmurs rubs or gallops, no lower extremity edema.  Respiratory: Clear to auscultation bilaterally. Not using accessory muscles, speaking in  full sentences.  Impression and Recommendations:    Rotator cuff syndrome of both shoulders X-rays did show mild glenohumeral disease in the right side. We did a right bicipital sheath as well as subcoracoid injection. She is for the most part pain-free with both shoulders. Doing extremely well with physical therapy. Would like prescription for myofascial release and massage therapy.  I spent 25 minutes with this patient, greater than 50% was face-to-face time counseling regarding the above diagnoses ___________________________________________ Gwen Her. Dianah Field, M.D., ABFM., CAQSM. Primary Care and [place] Instructor of [place] [facility]

## 2017-09-23 ENCOUNTER — Ambulatory Visit: Payer: BLUE CROSS/BLUE SHIELD

## 2017-09-23 ENCOUNTER — Ambulatory Visit: Payer: BLUE CROSS/BLUE SHIELD | Admitting: Physical Therapy

## 2017-09-23 ENCOUNTER — Other Ambulatory Visit: Payer: Self-pay | Admitting: Adult Health

## 2017-09-23 ENCOUNTER — Encounter: Payer: Self-pay | Admitting: Physical Therapy

## 2017-09-23 DIAGNOSIS — R293 Abnormal posture: Secondary | ICD-10-CM | POA: Diagnosis not present

## 2017-09-23 DIAGNOSIS — M25511 Pain in right shoulder: Principal | ICD-10-CM

## 2017-09-23 DIAGNOSIS — G8929 Other chronic pain: Secondary | ICD-10-CM | POA: Diagnosis not present

## 2017-09-23 DIAGNOSIS — C50511 Malignant neoplasm of lower-outer quadrant of right female breast: Secondary | ICD-10-CM

## 2017-09-23 DIAGNOSIS — Z170 Estrogen receptor positive status [ER+]: Principal | ICD-10-CM

## 2017-09-23 DIAGNOSIS — M25512 Pain in left shoulder: Secondary | ICD-10-CM | POA: Diagnosis not present

## 2017-09-23 DIAGNOSIS — M25611 Stiffness of right shoulder, not elsewhere classified: Secondary | ICD-10-CM | POA: Diagnosis not present

## 2017-09-23 DIAGNOSIS — M25612 Stiffness of left shoulder, not elsewhere classified: Secondary | ICD-10-CM

## 2017-09-23 MED ORDER — BUPROPION HCL ER (XL) 450 MG PO TB24: 450.0000 mg | ORAL_TABLET | Freq: Every day | ORAL | 3 refills | Status: DC

## 2017-09-23 NOTE — Therapy (Addendum)
[facility] [place] [address]  [place], [place], [address] Phone: [phone]   Fax:  [phone]  Physical Therapy Treatment  Patient Details  Name: Stacy Chung MRN: [id number] Date of Birth: [birth year] Referring Provider: Aundria Mems, MD   Encounter Date: 09/23/2017  PT End of Session - 09/23/17 0935    Visit Number  9    Number of Visits  12    Date for PT Re-Evaluation  10/02/17    Authorization Type  BCBS [place];  Billing codes not covered: 34742 (Self-care) & 59563 (PPT)    PT Start Time  0935    PT Stop Time  1020    PT Time Calculation (min)  45 min    Activity Tolerance  Patient tolerated treatment well    Behavior During Therapy  [facility] for tasks assessed/performed       Past Medical History:  Diagnosis Date  . Allergy   . Anxiety   . Breast cancer ([place])   . 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 Cancer ([place]) 1986   Hodgkins lymphoma  . Carcinoma of thyroid ([place])   . Depression   . Family history of breast cancer   . Family history of colon cancer   . Family history of ovarian cancer   . Family history of prostate cancer   . GERD (gastroesophageal reflux disease)   . History of chicken pox   . Hodgkin's disease   . Hx: UTI (urinary tract infection)   . Increased frequency of headaches   . Left breast mass   . Migraines   . PONV (postoperative nausea and vomiting)   . Pulmonary embolism ([place]) 11/30/2015  . Skin cancer 2003   Melanoma  . Thyroid disease     Past Surgical History:  Procedure Laterality Date  . FOOT TENDON SURGERY Left   . LAPAROSCOPIC SPLENECTOMY  1987   Hodgkins Disease  . THORACOTOMY  1991   collasped lung, spontaneous  . THYROIDECTOMY  2004   nodules, carcinoma  . URETEROPLASTY  1977   malformed ureter    There were no vitals filed for this visit.  Subjective Assessment - 09/23/17 0937    Subjective  Pt reporting new onset of "intense,  searing" brief flashes (~10 sec) of L anterolateral lower rib pain ~ 2 weeks ago w/o known triggering event.    Pertinent History  B mastectomies with reconstruction (+ R lymph node resection) s/p R breast cancer 08/2015    Patient Stated Goals  "To be able to use my arms normally"    Currently in Pain?  No/denies    Pain Score  -- up to 10/10 with above described events    Pain Location  Rib cage    Pain Orientation  Left;Lower;Anterior;Lateral    Pain Descriptors / Indicators  Sharp;Stabbing;Spasm    Pain Type  Acute pain                      [facility] Adult PT Treatment/Exercise - 09/23/17 0935      Exercises   Exercises  Shoulder      Shoulder Exercises: Prone   Other Prone Exercises  Quadruped B shoulder thread & reach x10      Shoulder Exercises: ROM/Strengthening   UBE (Upper Arm Bike)  L 3.0 fwd/back x3' each      Shoulder Exercises: Stretch   Other Shoulder Stretches  R sidelying -  L side stretch with with L shoulder in flexed postion overhead & L LE extending posteriorly off table (~QL stretch) 2 x 30"    Other Shoulder Stretches  Standing - L lats/QL stretch x30"      Manual Therapy   Manual Therapy  Soft tissue mobilization    Manual therapy comments  R sidelying     Soft tissue mobilization  STM to L serratus & lower anterolateral intercostals    Kinesiotex  Create Space      Kinesiotix   Create Space  L lower anterolateral ribs/serratus 30% + 30% perpendicular strip at area of greatest tenderness       Trigger Point Dry Needling - 09/23/17 0935    Consent Given?  Yes    Education Handout Provided  Yes    Muscles Treated Upper Body  -- serratus - twitch response           PT Education - 09/23/17 0940    Education provided  Yes    Education Details  DN - purpose, expected response, precautions & contraindications (including precautions for DN over lung field); HEP update - stretches    Person(s) Educated  Patient    Methods   Explanation;Demonstration;Handout    Comprehension  Verbalized understanding;Returned demonstration       PT Short Term Goals - 09/14/17 1314      PT SHORT TERM GOAL #1   Title  Independent with initial HEP    Status  Achieved      PT SHORT TERM GOAL #2   Title  Pt will verbalize/demonstrate understanding of neutral spine and shoulder posture to promote improved glenohumeral & scapular kinematics    Status  Achieved        PT Long Term Goals - 08/19/17 1407      PT LONG TERM GOAL #1   Title  Independent with ongoing/advanced HEP    Status  On-going      PT LONG TERM GOAL #2   Title  B shoulder AROM WFL w/o increased pain     Status  On-going      PT LONG TERM GOAL #3   Title  B shoulder strength >/= 4+/5 w/o pain on resistance    Status  On-going      PT LONG TERM GOAL #4   Title  Pt will report ability to complete sustained overhead activity for >/= 7 minutes w/o residual pain or fatigue to allow her to dry her hair with a hair dryer    Status  On-going            Plan - 09/23/17 0944    Clinical Impression Statement  [place] reporting pain which brought her to PT mostly resolved, but recent experiencing episodes of brief "intense, searing pain" in L anterolateral lower ribs w/o predictable triggering event or activity. Thoracic vertebrae and rib alignment WNL, but increased muscle tension noted in serratus and lower traps on L with TP identified in serratus. Pt provided education in role of DN in addressing TPs/abnormal muscle tension including expected response to treatment as well as precautions for DN over lung fields, with pt providing verbal consent for DN. Manual therapy and DN performed to L serratus muscle with positive twitch response and palpable decrease in muscle tension noted following treatment. Kinesiotaping reapplied to promote further reduction in muscle tension.    PT Treatment/Interventions  Patient/family education;Neuromuscular  re-education;Therapeutic exercise;Therapeutic activities;Manual techniques;Taping;Dry needling;Electrical Stimulation;Moist Heat;Cryotherapy;Iontophoresis 4mg /ml Dexamethasone    Consulted and Agree with  Plan of Care  Patient       Patient will benefit from skilled therapeutic intervention in order to improve the following deficits and impairments:  Pain, Postural dysfunction, Impaired flexibility, Decreased range of motion, Decreased strength, Increased muscle spasms, Impaired UE functional use, Decreased activity tolerance, Decreased endurance  Visit Diagnosis: Chronic right shoulder pain  Chronic left shoulder pain  Stiffness of right shoulder, not elsewhere classified  Stiffness of left shoulder, not elsewhere classified  Abnormal posture     Problem List Patient Active Problem List   Diagnosis Date Noted  . Rotator cuff syndrome of both shoulders 07/28/2017  . Pulmonary embolism ([place]) 01/29/2016  . Liver lesion 01/03/2016  . Family history of breast cancer   . Family history of ovarian cancer   . Family history of prostate cancer   . Family history of colon cancer   . Skin cancer   . Cancer ([place])   . Genetic testing 11/20/2015  . Ganglion cyst of flexor tendon sheath of finger of left hand 11/19/2015  . Breast cancer of lower-outer quadrant of right female breast ([place]) 09/22/2015  . 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 Peroneus longus tear 11/24/2013  . Toe swelling 09/06/2013  . Melanoma ([place]) 06/20/2011  . Hodgkin's lymphoma ([place]) 06/20/2011  . Migraine 06/20/2011  . Malignant neoplasm of thyroid gland ([place]) 06/20/2011  . Hyperglycemia 06/20/2011  . Hirsutism 06/20/2011    Percival Spanish, PT, MPT 09/23/2017, 2:37 PM  [facility] [address]  [place], [place], [address] Phone: [phone]   Fax:  [phone]  Name: Stacy Chung MRN: [id number] Date of  Birth: 12-05-1970

## 2017-09-23 NOTE — Patient Instructions (Addendum)

## 2017-09-24 ENCOUNTER — Ambulatory Visit: Payer: BLUE CROSS/BLUE SHIELD

## 2017-09-28 ENCOUNTER — Encounter: Payer: Self-pay | Admitting: Physical Therapy

## 2017-09-28 ENCOUNTER — Ambulatory Visit: Payer: BLUE CROSS/BLUE SHIELD | Admitting: Physical Therapy

## 2017-09-28 DIAGNOSIS — M25512 Pain in left shoulder: Secondary | ICD-10-CM | POA: Diagnosis not present

## 2017-09-28 DIAGNOSIS — M25511 Pain in right shoulder: Secondary | ICD-10-CM | POA: Diagnosis not present

## 2017-09-28 DIAGNOSIS — M25611 Stiffness of right shoulder, not elsewhere classified: Secondary | ICD-10-CM | POA: Diagnosis not present

## 2017-09-28 DIAGNOSIS — M25612 Stiffness of left shoulder, not elsewhere classified: Secondary | ICD-10-CM

## 2017-09-28 DIAGNOSIS — G8929 Other chronic pain: Secondary | ICD-10-CM

## 2017-09-28 DIAGNOSIS — R293 Abnormal posture: Secondary | ICD-10-CM

## 2017-09-28 NOTE — Therapy (Signed)
[facility] [place] [address]  [address] [place], [place], [address] Phone: [phone]   Fax:  [phone]  Physical Therapy Treatment  Patient Details  Name: Stacy Chung MRN: [id number] Date of Birth: 01/29/1971 Referring Provider: Thomas Thekkekandam, MD   Encounter Date: 09/28/2017  PT End of Session - 09/28/17 1626    Visit Number  10    Number of Visits  12    Date for PT Re-Evaluation  10/02/17    Authorization Type  BCBS - [place];  Billing codes not covered: 97535 (Self-care) & 97750 (PPT)    PT Start Time  1626    PT Stop Time  1705    PT Time Calculation (min)  39 min    Activity Tolerance  Patient tolerated treatment well    Behavior During Therapy  WFL for tasks assessed/performed       Past Medical History:  Diagnosis Date  . Allergy   . Anxiety   . Breast cancer ([facility])   . 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 Cancer ([facility]) 1986   Hodgkins lymphoma  . Carcinoma of thyroid ([facility])   . Depression   . Family history of breast cancer   . Family history of colon cancer   . Family history of ovarian cancer   . Family history of prostate cancer   . GERD (gastroesophageal reflux disease)   . History of chicken pox   . Hodgkin's disease   . Hx: UTI (urinary tract infection)   . Increased frequency of headaches   . Left breast mass   . Migraines   . PONV (postoperative nausea and vomiting)   . Pulmonary embolism ([facility]) 11/30/2015  . Skin cancer 2003   Melanoma  . Thyroid disease     Past Surgical History:  Procedure Laterality Date  . FOOT TENDON SURGERY Left   . LAPAROSCOPIC SPLENECTOMY  1987   Hodgkins Disease  . THORACOTOMY  1991   collasped lung, spontaneous  . THYROIDECTOMY  2004   nodules, carcinoma  . URETEROPLASTY  1977   malformed ureter    There were no vitals filed for this visit.  Subjective Assessment - 09/28/17 1628    Subjective  Pt states she feels like the DN "was a  success" with only 2 epsiodes of the rib pain since the last visit (once later the same day & once this morning). Did not some increased soreness in B shoulders after doing some overhead work over the weekend.    Pertinent History  B mastectomies with reconstruction (+ R lymph node resection) s/p R breast cancer 08/2015    Patient Stated Goals  "To be able to use my arms normally"    Currently in Pain?  Yes    Pain Score  1     Pain Location  Shoulder    Pain Orientation  Left;Right L > R    Pain Descriptors / Indicators  Aching;Discomfort    Pain Type  Acute pain    Pain Frequency  Intermittent         [facility] PT Assessment - 09/28/17 1626      Assessment   Medical Diagnosis  B RTC syndrome    Referring Provider  Stacy Thekkekandam, MD    Next MD Visit  as needed      Observation/Other Assessments   Focus on Therapeutic Outcomes (FOTO)   Shoulder - 61% (39% limitation)        AROM   Right Shoulder Flexion  149 Degrees    Right Shoulder ABduction  145 Degrees    Right Shoulder Internal Rotation  -- FIR to bra strap    Right Shoulder External Rotation  -- FER to T4    Left Shoulder Flexion  142 Degrees painful arc from ~110 dg and above    Left Shoulder ABduction  141 Degrees    Left Shoulder Internal Rotation  -- FIR to bra strap    Left Shoulder External Rotation  -- FER to T3      Strength   Right Shoulder Flexion  4+/5    Right Shoulder ABduction  4+/5 slight pain/pressure with resistance     Right Shoulder Internal Rotation  4+/5    Right Shoulder External Rotation  4+/5    Left Shoulder Flexion  4+/5    Left Shoulder ABduction  4/5    Left Shoulder Internal Rotation  4+/5    Left Shoulder External Rotation  4+/5                  [facility] Adult PT Treatment/Exercise - 09/28/17 1626      Exercises   Exercises  Shoulder      Shoulder Exercises: Standing   External Rotation  Right;Left;15 reps    Theraband Level (Shoulder External Rotation)  Level 2 (Red)     Internal Rotation  Right;Left;15 reps;Theraband    Theraband Level (Shoulder Internal Rotation)  Level 3 (Green);Level 2 (Red) R - green; L - red    Extension  Both;15 reps;Theraband;Strengthening    Theraband Level (Shoulder Extension)  Level 3 (Green)    Extension Weight (lbs)  cues to avoid shoulder hiking on latter reps    Other Standing Exercises  B alt shoulder flexion/extension with red TB leaning on doorframe x15 each      Shoulder Exercises: ROM/Strengthening   UBE (Upper Arm Bike)  L 3.0 fwd/back x3' each               PT Short Term Goals - 09/14/17 1314      PT SHORT TERM GOAL #1   Title  Independent with initial HEP    Status  Achieved      PT SHORT TERM GOAL #2   Title  Pt will verbalize/demonstrate understanding of neutral spine and shoulder posture to promote improved glenohumeral & scapular kinematics    Status  Achieved        PT Long Term Goals - 09/28/17 1644      PT LONG TERM GOAL #1   Title  Independent with ongoing/advanced HEP    Status  Partially Met      PT LONG TERM GOAL #2   Title  B shoulder AROM WFL w/o increased pain     Status  Partially Met      PT LONG TERM GOAL #3   Title  B shoulder strength >/= 4+/5 w/o pain on resistance    Status  Partially Met      PT LONG TERM GOAL #4   Title  Pt will report ability to complete sustained overhead activity for >/= 7 minutes w/o residual pain or fatigue to allow her to dry her hair with a hair dryer    Status  Partially Met more limited by fatigue than pain at present with muscle soreness noted afterwards            Plan - 09/28/17 1632    Clinical Impression Statement  Stacy Chung  reporting decrease in epsiodes of rib pain following DN/manual therapy last visit. Pt noting overall improvement with PT with improving activity tolerance for overhead activities, but still limited by fatigue more than pain. B shoulder ROM now WFL, with R slightly better than L and painful arc with upper range L  shoulder flexion. Strength now grossly 4+/5 with expection of L shoulder abduction 4/5 and slight pain/pressure with resistance for R shoulder abduction. Definite benefit noted from PT thus far, but with ongoing pain, may need to consider recert at end of current POC.    PT Treatment/Interventions  Patient/family education;Neuromuscular re-education;Therapeutic exercise;Therapeutic activities;Manual techniques;Taping;Dry needling;Electrical Stimulation;Moist Heat;Cryotherapy;Iontophoresis 4mg/ml Dexamethasone    Consulted and Agree with Plan of Care  Patient       Patient will benefit from skilled therapeutic intervention in order to improve the following deficits and impairments:  Pain, Postural dysfunction, Impaired flexibility, Decreased range of motion, Decreased strength, Increased muscle spasms, Impaired UE functional use, Decreased activity tolerance, Decreased endurance  Visit Diagnosis: Chronic right shoulder pain  Chronic left shoulder pain  Stiffness of right shoulder, not elsewhere classified  Stiffness of left shoulder, not elsewhere classified  Abnormal posture     Problem List Patient Active Problem List   Diagnosis Date Noted  . Rotator cuff syndrome of both shoulders 07/28/2017  . Pulmonary embolism ([facility]) 01/29/2016  . Liver lesion 01/03/2016  . Family history of breast cancer   . Family history of ovarian cancer   . Family history of prostate cancer   . Family history of colon cancer   . Skin cancer   . Cancer ([facility])   . Genetic testing 11/20/2015  . Ganglion cyst of flexor tendon sheath of finger of left hand 11/19/2015  . Breast cancer of lower-outer quadrant of right female breast ([facility]) 09/22/2015  . 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 Peroneus longus tear 11/24/2013  . Toe swelling 09/06/2013  . Melanoma ([facility]) 06/20/2011  . Hodgkin's lymphoma ([facility]) 06/20/2011  . Migraine 06/20/2011  . Malignant neoplasm of thyroid gland  ([facility]) 06/20/2011  . Hyperglycemia 06/20/2011  . Hirsutism 06/20/2011     M ,PT, MPT 09/28/2017, 5:36 PM  [facility] [place] [address]  [address] [place], [place], [address] Phone: [phone]   Fax:  [phone]  Name: Stacy Chung MRN: [id number] Date of Birth: 05/02/1971   

## 2017-09-29 ENCOUNTER — Ambulatory Visit (INDEPENDENT_AMBULATORY_CARE_PROVIDER_SITE_OTHER): Payer: BLUE CROSS/BLUE SHIELD

## 2017-09-29 DIAGNOSIS — M85852 Other specified disorders of bone density and structure, left thigh: Secondary | ICD-10-CM | POA: Diagnosis not present

## 2017-09-29 DIAGNOSIS — Z780 Asymptomatic menopausal state: Secondary | ICD-10-CM | POA: Diagnosis not present

## 2017-10-01 ENCOUNTER — Ambulatory Visit: Payer: BLUE CROSS/BLUE SHIELD | Admitting: Physical Therapy

## 2017-10-06 ENCOUNTER — Encounter: Payer: BLUE CROSS/BLUE SHIELD | Admitting: Physical Therapy

## 2017-10-07 ENCOUNTER — Ambulatory Visit: Payer: BLUE CROSS/BLUE SHIELD | Admitting: Physical Therapy

## 2017-10-07 ENCOUNTER — Encounter: Payer: Self-pay | Admitting: Physical Therapy

## 2017-10-07 DIAGNOSIS — M25512 Pain in left shoulder: Secondary | ICD-10-CM

## 2017-10-07 DIAGNOSIS — M25612 Stiffness of left shoulder, not elsewhere classified: Secondary | ICD-10-CM

## 2017-10-07 DIAGNOSIS — R293 Abnormal posture: Secondary | ICD-10-CM

## 2017-10-07 DIAGNOSIS — M25611 Stiffness of right shoulder, not elsewhere classified: Secondary | ICD-10-CM

## 2017-10-07 DIAGNOSIS — G8929 Other chronic pain: Secondary | ICD-10-CM

## 2017-10-07 DIAGNOSIS — M25511 Pain in right shoulder: Principal | ICD-10-CM

## 2017-10-07 NOTE — Therapy (Addendum)
[facility] [place] [address]  [place], [place], [address] Phone: [phone]   Fax:  [phone]  Physical Therapy Treatment  Patient Details  Name: Stacy Chung MRN: [id number] Date of Birth: 03-Aug-1970 Referring Provider: Aundria Mems, MD   Encounter Date: 10/07/2017  PT End of Session - 10/07/17 0900    Visit Number  11    Number of Visits  12    Date for PT Re-Evaluation  10/02/17    Authorization Type  BCBS - [place]: [place];  Billing codes not covered: 78676 (Self-care) & 72094 (PPT)    PT Start Time  0900 pt arrived late    PT Stop Time  0934    PT Time Calculation (min)  34 min    Activity Tolerance  Patient tolerated treatment well    Behavior During Therapy  [facility] for tasks assessed/performed       Past Medical History:  Diagnosis Date  . Allergy   . Anxiety   . Breast cancer ([place])   . 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 Cancer ([place]) 1986   Hodgkins lymphoma  . Carcinoma of thyroid ([place])   . Depression   . Family history of breast cancer   . Family history of colon cancer   . Family history of ovarian cancer   . Family history of prostate cancer   . GERD (gastroesophageal reflux disease)   . History of chicken pox   . Hodgkin's disease   . Hx: UTI (urinary tract infection)   . Increased frequency of headaches   . Left breast mass   . Migraines   . PONV (postoperative nausea and vomiting)   . Pulmonary embolism ([place]) 11/30/2015  . Skin cancer 2003   Melanoma  . Thyroid disease     Past Surgical History:  Procedure Laterality Date  . FOOT TENDON SURGERY Left   . LAPAROSCOPIC SPLENECTOMY  1987   Hodgkins Disease  . THORACOTOMY  1991   collasped lung, spontaneous  . THYROIDECTOMY  2004   nodules, carcinoma  . URETEROPLASTY  1977   malformed ureter    There were no vitals filed for this visit.  Subjective Assessment - 10/07/17 0903    Subjective  Pt reporting only 5  instances of rib pain since last PT visit, with intensity less severe - pt feels like this is a big improvement. Pt denies pain currently, but did have pain when she tried to do her hair earlier this week after doing some overhead work on the weekend - states it has been much easier to do the overhead activities.    Pertinent History  B mastectomies with reconstruction (+ R lymph node resection) s/p R breast cancer 08/2015    Patient Stated Goals  "To be able to use my arms normally"    Currently in Pain?  No/denies                No data recorded       [facility] Adult PT Treatment/Exercise - 10/07/17 0900      Exercises   Exercises  Shoulder      Shoulder Exercises: Standing   Horizontal ABduction  Both;15 reps;Theraband;Strengthening    Theraband Level (Shoulder Horizontal ABduction)  Level 2 (Red)    Horizontal ABduction Limitations  standing against pool noodle on wall    External Rotation  Both;15 reps;Theraband;Strengthening    Theraband Level (Shoulder External Rotation)  Level 2 (Red)  External Rotation Limitations  standing against pool noodle on wall    Flexion  Both;10 reps;Theraband;Strengthening    Theraband Level (Shoulder Flexion)  Level 1 (Yellow)    Flexion Limitations  band anchored under foot    Other Standing Exercises  B alt shoulder flexion/extension with red TB leaning on pool noodle on wall x15 each    Other Standing Exercises  W row with red TB 15 x 3"      Shoulder Exercises: ROM/Strengthening   UBE (Upper Arm Bike)  L 3.0 fwd/back x3' each      Manual Therapy   Manual Therapy  Soft tissue mobilization;Myofascial release    Manual therapy comments  R sidelying     Soft tissue mobilization  L serratus & posterior capsule, esp teres group & lats    Myofascial Release  TPR L teres group & lats             PT Education - 10/07/17 0934    Education provided  Yes    Education Details  HEP update    Person(s) Educated  Patient    Methods   Explanation;Demonstration;Handout    Comprehension  Verbalized understanding;Returned demonstration       PT Short Term Goals - 09/14/17 1314      PT SHORT TERM GOAL #1   Title  Independent with initial HEP    Status  Achieved      PT SHORT TERM GOAL #2   Title  Pt will verbalize/demonstrate understanding of neutral spine and shoulder posture to promote improved glenohumeral & scapular kinematics    Status  Achieved        PT Long Term Goals - 09/28/17 1644      PT LONG TERM GOAL #1   Title  Independent with ongoing/advanced HEP    Status  Partially Met      PT LONG TERM GOAL #2   Title  B shoulder AROM WFL w/o increased pain     Status  Partially Met      PT LONG TERM GOAL #3   Title  B shoulder strength >/= 4+/5 w/o pain on resistance    Status  Partially Met      PT LONG TERM GOAL #4   Title  Pt will report ability to complete sustained overhead activity for >/= 7 minutes w/o residual pain or fatigue to allow her to dry her hair with a hair dryer    Status  Partially Met more limited by fatigue than pain at present with muscle soreness noted afterwards            Plan - 10/07/17 0907    Clinical Impression Statement  Stacy Chung pleased with her progress with PT, noting improving tolerance for overhead activities with less pain (more just muscle soreness following activity). Pt did note "golf ball" area of increased tension/tightness in posterior/inferior shoulder that resolved with manual therapy. Discussed current progress and readiness for transition to HEP, with pt in agreement with this plan therefore initiated HEP review update. Will plan to complete this on next visit and than transition to HEP with 30 day hold.    Rehab Potential  Good    PT Treatment/Interventions  Patient/family education;Neuromuscular re-education;Therapeutic exercise;Therapeutic activities;Manual techniques;Taping;Dry needling;Electrical Stimulation;Moist Heat;Cryotherapy;Iontophoresis 66m/ml  Dexamethasone    Consulted and Agree with Plan of Care  Patient       Patient will benefit from skilled therapeutic intervention in order to improve the following deficits and impairments:  Pain, Postural  dysfunction, Impaired flexibility, Decreased range of motion, Decreased strength, Increased muscle spasms, Impaired UE functional use, Decreased activity tolerance, Decreased endurance  Visit Diagnosis: Chronic right shoulder pain  Chronic left shoulder pain  Stiffness of right shoulder, not elsewhere classified  Stiffness of left shoulder, not elsewhere classified  Abnormal posture     Problem List Patient Active Problem List   Diagnosis Date Noted  . Rotator cuff syndrome of both shoulders 07/28/2017  . Pulmonary embolism ([place]) 01/29/2016  . Liver lesion 01/03/2016  . Family history of breast cancer   . Family history of ovarian cancer   . Family history of prostate cancer   . Family history of colon cancer   . Skin cancer   . Cancer ([place])   . Genetic testing 11/20/2015  . Ganglion cyst of flexor tendon sheath of finger of left hand 11/19/2015  . Breast cancer of lower-outer quadrant of right female breast ([place]) 09/22/2015  . 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 Peroneus longus tear 11/24/2013  . Toe swelling 09/06/2013  . Melanoma ([place]) 06/20/2011  . Hodgkin's lymphoma ([place]) 06/20/2011  . Migraine 06/20/2011  . Malignant neoplasm of thyroid gland ([place]) 06/20/2011  . Hyperglycemia 06/20/2011  . Hirsutism 06/20/2011    Percival Spanish, PT, MPT 10/07/2017, 1:33 PM  [facility] [address]  [place], [place], [address] Phone: [phone]   Fax:  [phone]  Name: Stacy Chung MRN: [id number] Date of Birth: 08/21/1970  PHYSICAL THERAPY DISCHARGE SUMMARY  Visits from Start of Care: 11  Current functional level related to goals / functional outcomes:   Unable  to formally assess status at discharge as pt cancelled remaining appointment due to new injury, but refer to above clinical impression for status as of last visit on 10/07/17. Pt has not returned to PT in >30 days, therefore will proceed with discharge from PT for this episode.   Remaining deficits:   As above.   Education / Equipment:   HEP  Plan: Patient agrees to discharge.  Patient goals were partially met. Patient is being discharged due to a change in medical status.  ?????     Percival Spanish, PT, MPT 11/30/17, 1:26 PM  [facility] [address]  [address] [place] [place], [place], [address] Phone: [phone]   Fax:  [phone]

## 2017-10-26 ENCOUNTER — Ambulatory Visit: Payer: BLUE CROSS/BLUE SHIELD | Admitting: Physical Therapy

## 2017-11-12 ENCOUNTER — Ambulatory Visit (INDEPENDENT_AMBULATORY_CARE_PROVIDER_SITE_OTHER): Payer: BLUE CROSS/BLUE SHIELD | Admitting: Sports Medicine

## 2017-11-12 DIAGNOSIS — M25531 Pain in right wrist: Secondary | ICD-10-CM

## 2017-11-12 NOTE — Assessment & Plan Note (Signed)
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 Velcro wrist brace without thumb orthosis. Continue ibuprofen 800, rehab exercises given, return in 2 weeks, if persistent symptoms we will place her in a cast. Holding off on x-rays for now.

## 2017-11-12 NOTE — Patient Instructions (Signed)
Triangular Fibrocartilage Tear A triangular fibrocartilage tear is a tear in cartilage or a ligament along the pinkie side of your wrist. The cartilage and ligaments in your wrist help to cushion and support to the bones of your wrist. What are the causes? This condition may be caused by:  Falling onto an outstretched hand and overextending your wrist.  Repetitive motions (overuse) that put too much pressure on your wrist.  What increases the risk? This condition is more likely to develop in people who:  Have one forearm bone that is shorter than the other.  Participate in sports that put pressure on the wrist, such as: ? Gymnastics. ? Tennis. ? Golf. ? Baseball. ? Racquetball. ? Hockey.  What are the signs or symptoms? Symptoms of this condition include:  Pain or tenderness on the pinkie side of your wrist.  A clicking or popping sensation in the wrist.  Reduced grip strength.  How is this diagnosed? This condition may be diagnosed based on:  Your symptoms.  Your medical history.  A physical exam. During the exam your health care provider may move your hand and wrist to determine what is causing your pain.  Tests, such as: ? An X-ray. This may be done to check for broken bones. ? An MRI. This may be done to check ligaments and cartilage and to look for broken bones that did not show up on your X-ray. ? An arthrogram. This is a kind of X-ray called that is taken after a dye is injected into your joint. ? Diagnostic arthroscopy. This is a surgical procedure that lets your health care provider see inside your wrist joint. It may be done if the cause of your wrist pain is not clear after you have other tests.  How is this treated? Treatment for this condition may include:  Resting the wrist. You may need to avoid or modify your participation in sports or other physical activity for a period of time.  Icing the wrist. This can help with swelling and pain.  Keeping the  wrist raised (elevated) above your heart. This helps reduce swelling.  Using a splint or cast. This helps keep the wrist still so it can heal.  Physical therapy. This helps restore range of motion in the wrist and strengthen the wrist.  Anti-inflammatory medicine, such as ibuprofen. These can help reduce pain and swelling.  Surgery. More serious tears or complete tears may require surgery to repair a ligament.  Follow these instructions at home: If you have a splint:   Do not put pressure on any part of your splint until it is fully hardened. This may take several hours.  Wear it as told by your health care provider. Remove it only as told by your health care provider.  Loosen the splint if your fingers become numb and tingle, or if they turn cold and blue.  If your splint is not waterproof: ? Do not let it get wet. ? Cover it with a watertight covering when you take a bath or a shower.  Keep the splint clean. If you have a cast:  Do not put pressure on any part of your cast until it is fully hardened. This may take several hours.  Do not stick anything inside the cast to scratch your skin. Doing that increases your risk of infection.  Check the skin around the cast every day. Report any concerns to your health care provider.  You may put lotion on dry skin around the edges  of the cast. Do not apply lotion to the skin underneath the cast.  If your cast is not waterproof: ? Do not let it get wet. ? Cover it with a watertight covering when you take a bath or a shower.  Keep the cast clean. Managing pain, stiffness, and swelling  Take over-the-counter and prescription medicines only as told by your health care provider.  If directed, put ice on the injured area. ? Put ice in a plastic bag. ? Place a towel between your skin and the bag. ? Leave the ice on for 20 minutes, 2-3 times a day or as needed.  Move your fingers often to avoid stiffness and to lessen  swelling.  Elevate the injured area above the level of your heart while you are sitting or lying down. Activity  Return to your normal activities as told by your health care provider. Ask your health care provider what activities are safe for you.  Do exercises only as told by your health care provider. General instructions  Do not use your wrist to support your body weight until your health care provider says that you can.  Ask your health care provider when it is safe for you to drive if you have a splint or a cast.  Keep all follow-up visits as told by your health care provider. This is important. How is this prevented?  Warm up and stretch before being active.  Cool down and stretch after being active.  Give your body time to rest between periods of activity.  Make sure to use equipment that fits you.  Be safe and responsible while being active to avoid falls.  Do at least 150 minutes of moderate-intensity exercise each week, such as brisk walking or water aerobics.  Maintain physical fitness, including: ? Strength. ? Flexibility. ? Cardiovascular fitness. ? Endurance. Contact a health care provider if:  Your wrist pain does not improve or it gets worse. Get help right away if:  You have severe pain.  You cannot move your wrist. This information is not intended to replace advice given to you by your health care provider. Make sure you discuss any questions you have with your health care provider. Document Released: 06/30/2005 Document Revised: 03/05/2016 Document Reviewed: 05/08/2015 Elsevier Interactive Patient Education  2018 [employer].

## 2017-11-12 NOTE — Progress Notes (Signed)
Subjective:    CC: Right wrist pain  HPI: This is a very pleasant 47 year old female, recently she has developed some pain on the ulnar aspect of her right wrist, no swelling, occasional popping.  This started when gripping the gas nozzle, it then occurred again with routine activities in her wrist.  No trauma, pain at the tip of the ulna.  Moderate, persistent.  I reviewed the past medical history, family history, social history, surgical history, and allergies today and no changes were needed.  Please see the problem list section below in epic for further details.  Past Medical History: Past Medical History:  Diagnosis Date  . Allergy   . Anxiety   . Breast cancer ([place])   . 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 Cancer ([place]) 1986   Hodgkins lymphoma  . Carcinoma of thyroid ([place])   . Depression   . Family history of breast cancer   . Family history of colon cancer   . Family history of ovarian cancer   . Family history of prostate cancer   . GERD (gastroesophageal reflux disease)   . History of chicken pox   . Hodgkin's disease   . Hx: UTI (urinary tract infection)   . Increased frequency of headaches   . Left breast mass   . Migraines   . PONV (postoperative nausea and vomiting)   . Pulmonary embolism ([place]) 11/30/2015  . Skin cancer 2003   Melanoma  . Thyroid disease    Past Surgical History: Past Surgical History:  Procedure Laterality Date  . FOOT TENDON SURGERY Left   . LAPAROSCOPIC SPLENECTOMY  1987   Hodgkins Disease  . THORACOTOMY  1991   collasped lung, spontaneous  . THYROIDECTOMY  2004   nodules, carcinoma  . URETEROPLASTY  1977   malformed ureter   Social History: Social History   Socioeconomic History  . Marital status: Married    Spouse name: Not on file  . Number of children: 2  . Years of education: 36  . Highest education level: Not on file  Occupational History  . Occupation: Chief Strategy Officer  Social Needs  . Financial  resource strain: Not on file  . Food insecurity:    Worry: Not on file    Inability: Not on file  . Transportation needs:    Medical: Not on file    Non-medical: Not on file  Tobacco Use  . Smoking status: Never Smoker  . Smokeless tobacco: Never Used  Substance and Sexual Activity  . Alcohol use: Yes    Comment: social  . Drug use: No  . Sexual activity: Yes    Birth control/protection: IUD  Lifestyle  . Physical activity:    Days per week: Not on file    Minutes per session: Not on file  . Stress: Not on file  Relationships  . Social connections:    Talks on phone: Not on file    Gets together: Not on file    Attends religious service: Not on file    Active member of club or organization: Not on file    Attends meetings of clubs or organizations: Not on file    Relationship status: Not on file  Other Topics Concern  . Not on file  Social History Narrative   Regular exercise-no   Family History: Family History  Problem Relation Age of Onset  . Alcohol abuse Maternal Aunt   . Prostate cancer Maternal Uncle 57  . Colon cancer  Maternal Grandmother        dx in her 40s  . Prostate cancer Maternal Grandfather        dx in his 39s  . Cancer Other        FH of Breast Cancer, Ovarian/Uterine Cancer  . Heart disease Maternal Uncle        15s  . Breast cancer Other        MGFs sister - reportedly BRCA+  . Ovarian cancer Other        PGFs mother  . Ovarian cancer Other        PGFs maternal aunt  . Ovarian cancer Other        PGFs maternal grandmohter  . Breast cancer Other        MGMs sister  . Breast cancer Other        MGFs mother   Allergies: Allergies  Allergen Reactions  . Other Rash    "bandaides"  . Macrodantin   . Pertussis Vaccines   . Tape Rash    "plastic tape"   Medications: See med rec.  Review of Systems: No fevers, chills, night sweats, weight loss, chest pain, or shortness of breath.   Objective:    General: Well Developed, well  nourished, and in no acute distress.  Neuro: Alert and oriented x3, extra-ocular muscles intact, sensation grossly intact.  HEENT: Normocephalic, atraumatic, pupils equal round reactive to light, neck supple, no masses, no lymphadenopathy, thyroid nonpalpable.  Skin: Warm and dry, no rashes. Cardiac: Regular rate and rhythm, no murmurs rubs or gallops, no lower extremity edema.  Respiratory: Clear to auscultation bilaterally. Not using accessory muscles, speaking in full sentences. Right wrist: Inspection normal with no visible erythema or swelling. ROM smooth and normal with good flexion and extension and ulnar/radial deviation that is symmetrical with opposite wrist. Palpation is normal over metacarpals, navicular, lunate, tendons without tenderness/ swelling Pain with passive ulnar deviation of the wrist consistent with a TFCC injury. No pain with passive radial deviation and resisted active ulnar deviation to suggest an extensor carpi ulnaris injury. No snuffbox tenderness. No tenderness over Canal of [place]. Strength 5/5 in all directions without pain. Negative tinel's and phalens signs. Negative Finkelstein sign. Negative Watson's test.  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 Velcro wrist brace without thumb orthosis. Continue ibuprofen 800, rehab exercises given, return in 2 weeks, if persistent symptoms we will place her in a cast. Holding off on x-rays for now. ___________________________________________ Gwen Her. Dianah Field, M.D., ABFM., CAQSM. Primary Care and [place] Instructor of [place] of [facility] of Medicine

## 2017-11-19 ENCOUNTER — Ambulatory Visit: Payer: BLUE CROSS/BLUE SHIELD | Admitting: Physical Therapy

## 2017-11-21 ENCOUNTER — Other Ambulatory Visit: Payer: Self-pay | Admitting: Hematology and Oncology

## 2017-11-27 ENCOUNTER — Encounter: Payer: Self-pay | Admitting: Sports Medicine

## 2017-11-27 ENCOUNTER — Ambulatory Visit (INDEPENDENT_AMBULATORY_CARE_PROVIDER_SITE_OTHER): Payer: BLUE CROSS/BLUE SHIELD | Admitting: Sports Medicine

## 2017-11-27 DIAGNOSIS — M25531 Pain in right wrist: Secondary | ICD-10-CM

## 2017-11-27 DIAGNOSIS — Z0000 Encounter for general adult medical examination without abnormal findings: Secondary | ICD-10-CM

## 2017-11-27 NOTE — Assessment & Plan Note (Signed)
Initially suspected TFCC strain, I think this is a resolved now. Discontinue Velcro brace, continue rehab exercises, return as needed, exam is benign.

## 2017-11-27 NOTE — Progress Notes (Signed)
Subjective:    CC: Follow-up  HPI: Stacy Chung returns, her wrist pain is improved considerably, almost no pain now.  She would like me to go ahead and order her labs, she has a physical coming up in October.  I reviewed the past medical history, family history, social history, surgical history, and allergies today and no changes were needed.  Please see the problem list section below in epic for further details.  Past Medical History: Past Medical History:  Diagnosis Date  . Allergy   . Anxiety   . Breast cancer ([place])   . 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 Cancer ([place]) 1986   Hodgkins lymphoma  . Carcinoma of thyroid ([place])   . Depression   . Family history of breast cancer   . Family history of colon cancer   . Family history of ovarian cancer   . Family history of prostate cancer   . GERD (gastroesophageal reflux disease)   . History of chicken pox   . Hodgkin's disease   . Hx: UTI (urinary tract infection)   . Increased frequency of headaches   . Left breast mass   . Migraines   . PONV (postoperative nausea and vomiting)   . Pulmonary embolism ([place]) 11/30/2015  . Skin cancer 2003   Melanoma  . Thyroid disease    Past Surgical History: Past Surgical History:  Procedure Laterality Date  . FOOT TENDON SURGERY Left   . LAPAROSCOPIC SPLENECTOMY  1987   Hodgkins Disease  . THORACOTOMY  1991   collasped lung, spontaneous  . THYROIDECTOMY  2004   nodules, carcinoma  . URETEROPLASTY  1977   malformed ureter   Social History: Social History   Socioeconomic History  . Marital status: Married    Spouse name: Not on file  . Number of children: 2  . Years of education: 76  . Highest education level: Not on file  Occupational History  . Occupation: Chief Strategy Officer  Social Needs  . Financial resource strain: Not on file  . Food insecurity:    Worry: Not on file    Inability: Not on file  . Transportation needs:    Medical: Not on file   Non-medical: Not on file  Tobacco Use  . Smoking status: Never Smoker  . Smokeless tobacco: Never Used  Substance and Sexual Activity  . Alcohol use: Yes    Comment: social  . Drug use: No  . Sexual activity: Yes    Birth control/protection: IUD  Lifestyle  . Physical activity:    Days per week: Not on file    Minutes per session: Not on file  . Stress: Not on file  Relationships  . Social connections:    Talks on phone: Not on file    Gets together: Not on file    Attends religious service: Not on file    Active member of club or organization: Not on file    Attends meetings of clubs or organizations: Not on file    Relationship status: Not on file  Other Topics Concern  . Not on file  Social History Narrative   Regular exercise-no   Family History: Family History  Problem Relation Age of Onset  . Alcohol abuse Maternal Aunt   . Prostate cancer Maternal Uncle 48  . Colon cancer Maternal Grandmother        dx in her 43s  . Prostate cancer Maternal Grandfather        dx in  his 68s  . Cancer Other        FH of Breast Cancer, Ovarian/Uterine Cancer  . Heart disease Maternal Uncle        38s  . Breast cancer Other        MGFs sister - reportedly BRCA+  . Ovarian cancer Other        PGFs mother  . Ovarian cancer Other        PGFs maternal aunt  . Ovarian cancer Other        PGFs maternal grandmohter  . Breast cancer Other        MGMs sister  . Breast cancer Other        MGFs mother   Allergies: Allergies  Allergen Reactions  . Other Rash    "bandaides"  . Macrodantin   . Pertussis Vaccines   . Tape Rash    "plastic tape"   Medications: See med rec.  Review of Systems: No fevers, chills, night sweats, weight loss, chest pain, or shortness of breath.   Objective:    General: Well Developed, well nourished, and in no acute distress.  Neuro: Alert and oriented x3, extra-ocular muscles intact, sensation grossly intact.  HEENT: Normocephalic, atraumatic,  pupils equal round reactive to light, neck supple, no masses, no lymphadenopathy, thyroid nonpalpable.  Skin: Warm and dry, no rashes. Cardiac: Regular rate and rhythm, no murmurs rubs or gallops, no lower extremity edema.  Respiratory: Clear to auscultation bilaterally. Not using accessory muscles, speaking in full sentences. Right wrist: Inspection normal with no visible erythema or swelling. ROM smooth and normal with good flexion and extension and ulnar/radial deviation that is symmetrical with opposite wrist. Palpation is normal over metacarpals, navicular, lunate, and TFCC; tendons without tenderness/ swelling No snuffbox tenderness. No tenderness over Canal of [place]. Strength 5/5 in all directions without pain. Negative tinel's and phalens signs. Negative Finkelstein sign. Negative Watson's test.  Impression and Recommendations:    Right wrist pain Initially suspected TFCC strain, I think this is a resolved now. Discontinue Velcro brace, continue rehab exercises, return as needed, exam is benign.  Annual physical exam Patient is coming in October for her routine physical, adding routine labs to be done maybe a week or 2 before. ___________________________________________ Gwen Her. Dianah Field, M.D., ABFM., CAQSM. Primary Care and [place] Instructor of [place] of [facility] of Medicine

## 2017-11-27 NOTE — Assessment & Plan Note (Signed)
Patient is coming in October for her routine physical, adding routine labs to be done maybe a week or 2 before.

## 2017-12-12 ENCOUNTER — Emergency Department (HOSPITAL_BASED_OUTPATIENT_CLINIC_OR_DEPARTMENT_OTHER): Payer: BLUE CROSS/BLUE SHIELD

## 2017-12-12 ENCOUNTER — Encounter (HOSPITAL_BASED_OUTPATIENT_CLINIC_OR_DEPARTMENT_OTHER): Payer: Self-pay | Admitting: Emergency Medicine

## 2017-12-12 ENCOUNTER — Other Ambulatory Visit: Payer: Self-pay

## 2017-12-12 ENCOUNTER — Inpatient Hospital Stay (HOSPITAL_BASED_OUTPATIENT_CLINIC_OR_DEPARTMENT_OTHER)
Admission: EM | Admit: 2017-12-12 | Discharge: 2017-12-18 | DRG: 200 | Disposition: A | Discharge status: Home / Self Care | Payer: BLUE CROSS/BLUE SHIELD | Attending: Internal Medicine | Admitting: Internal Medicine

## 2017-12-12 DIAGNOSIS — Z87440 Personal history of urinary (tract) infections: Secondary | ICD-10-CM

## 2017-12-12 DIAGNOSIS — E039 Hypothyroidism, unspecified: Secondary | ICD-10-CM | POA: Diagnosis present

## 2017-12-12 DIAGNOSIS — Z79890 Hormone replacement therapy (postmenopausal): Secondary | ICD-10-CM | POA: Diagnosis not present

## 2017-12-12 DIAGNOSIS — Z9013 Acquired absence of bilateral breasts and nipples: Secondary | ICD-10-CM

## 2017-12-12 DIAGNOSIS — J9311 Primary spontaneous pneumothorax: Principal | ICD-10-CM | POA: Diagnosis present

## 2017-12-12 DIAGNOSIS — Z91048 Other nonmedicinal substance allergy status: Secondary | ICD-10-CM

## 2017-12-12 DIAGNOSIS — J90 Pleural effusion, not elsewhere classified: Secondary | ICD-10-CM | POA: Diagnosis not present

## 2017-12-12 DIAGNOSIS — F32A Depression, unspecified: Secondary | ICD-10-CM | POA: Diagnosis present

## 2017-12-12 DIAGNOSIS — J9811 Atelectasis: Secondary | ICD-10-CM | POA: Diagnosis not present

## 2017-12-12 DIAGNOSIS — Z4682 Encounter for fitting and adjustment of non-vascular catheter: Secondary | ICD-10-CM

## 2017-12-12 DIAGNOSIS — J9383 Other pneumothorax: Secondary | ICD-10-CM | POA: Diagnosis not present

## 2017-12-12 DIAGNOSIS — R079 Chest pain, unspecified: Secondary | ICD-10-CM | POA: Diagnosis not present

## 2017-12-12 DIAGNOSIS — Z79899 Other long term (current) drug therapy: Secondary | ICD-10-CM

## 2017-12-12 DIAGNOSIS — C50511 Malignant neoplasm of lower-outer quadrant of right female breast: Secondary | ICD-10-CM | POA: Diagnosis present

## 2017-12-12 DIAGNOSIS — J939 Pneumothorax, unspecified: Secondary | ICD-10-CM

## 2017-12-12 DIAGNOSIS — Z8041 Family history of malignant neoplasm of ovary: Secondary | ICD-10-CM

## 2017-12-12 DIAGNOSIS — Z803 Family history of malignant neoplasm of breast: Secondary | ICD-10-CM | POA: Diagnosis not present

## 2017-12-12 DIAGNOSIS — Z85850 Personal history of malignant neoplasm of thyroid: Secondary | ICD-10-CM

## 2017-12-12 DIAGNOSIS — F419 Anxiety disorder, unspecified: Secondary | ICD-10-CM | POA: Diagnosis present

## 2017-12-12 DIAGNOSIS — E860 Dehydration: Secondary | ICD-10-CM | POA: Diagnosis not present

## 2017-12-12 DIAGNOSIS — N179 Acute kidney failure, unspecified: Secondary | ICD-10-CM | POA: Diagnosis present

## 2017-12-12 DIAGNOSIS — R0602 Shortness of breath: Secondary | ICD-10-CM | POA: Diagnosis not present

## 2017-12-12 DIAGNOSIS — K219 Gastro-esophageal reflux disease without esophagitis: Secondary | ICD-10-CM | POA: Diagnosis not present

## 2017-12-12 DIAGNOSIS — Z85820 Personal history of malignant melanoma of skin: Secondary | ICD-10-CM

## 2017-12-12 DIAGNOSIS — Z9689 Presence of other specified functional implants: Secondary | ICD-10-CM

## 2017-12-12 DIAGNOSIS — Z923 Personal history of irradiation: Secondary | ICD-10-CM | POA: Diagnosis not present

## 2017-12-12 DIAGNOSIS — Z887 Allergy status to serum and vaccine status: Secondary | ICD-10-CM | POA: Diagnosis not present

## 2017-12-12 DIAGNOSIS — Z800 Family history of malignant neoplasm of digestive organs: Secondary | ICD-10-CM

## 2017-12-12 DIAGNOSIS — Z8571 Personal history of Hodgkin lymphoma: Secondary | ICD-10-CM

## 2017-12-12 DIAGNOSIS — Z881 Allergy status to other antibiotic agents status: Secondary | ICD-10-CM

## 2017-12-12 DIAGNOSIS — Z9081 Acquired absence of spleen: Secondary | ICD-10-CM

## 2017-12-12 DIAGNOSIS — F329 Major depressive disorder, single episode, unspecified: Secondary | ICD-10-CM | POA: Diagnosis present

## 2017-12-12 DIAGNOSIS — E890 Postprocedural hypothyroidism: Secondary | ICD-10-CM | POA: Diagnosis present

## 2017-12-12 DIAGNOSIS — Z86711 Personal history of pulmonary embolism: Secondary | ICD-10-CM

## 2017-12-12 DIAGNOSIS — Z8042 Family history of malignant neoplasm of prostate: Secondary | ICD-10-CM

## 2017-12-12 DIAGNOSIS — I4581 Long QT syndrome: Secondary | ICD-10-CM | POA: Diagnosis not present

## 2017-12-12 HISTORY — DX: Primary spontaneous pneumothorax: J93.11

## 2017-12-12 HISTORY — DX: Other pneumothorax: J93.83

## 2017-12-12 NOTE — ED Triage Notes (Addendum)
C/o L neck/shoulder pain earlier today, now reports chest pain to L chest and central chest worse with breathing. Returned from [place] today. Personal hx of PE.

## 2017-12-13 ENCOUNTER — Inpatient Hospital Stay (HOSPITAL_COMMUNITY): Payer: BLUE CROSS/BLUE SHIELD

## 2017-12-13 ENCOUNTER — Encounter (HOSPITAL_COMMUNITY): Payer: Self-pay | Admitting: Thoracic Surgery (Cardiothoracic Vascular Surgery)

## 2017-12-13 DIAGNOSIS — J9311 Primary spontaneous pneumothorax: Secondary | ICD-10-CM | POA: Diagnosis not present

## 2017-12-13 DIAGNOSIS — Z887 Allergy status to serum and vaccine status: Secondary | ICD-10-CM | POA: Diagnosis not present

## 2017-12-13 DIAGNOSIS — E890 Postprocedural hypothyroidism: Secondary | ICD-10-CM | POA: Diagnosis present

## 2017-12-13 DIAGNOSIS — C50511 Malignant neoplasm of lower-outer quadrant of right female breast: Secondary | ICD-10-CM

## 2017-12-13 DIAGNOSIS — N179 Acute kidney failure, unspecified: Secondary | ICD-10-CM | POA: Diagnosis present

## 2017-12-13 DIAGNOSIS — Z79890 Hormone replacement therapy (postmenopausal): Secondary | ICD-10-CM | POA: Diagnosis not present

## 2017-12-13 DIAGNOSIS — J939 Pneumothorax, unspecified: Secondary | ICD-10-CM | POA: Diagnosis not present

## 2017-12-13 DIAGNOSIS — Z9081 Acquired absence of spleen: Secondary | ICD-10-CM | POA: Diagnosis not present

## 2017-12-13 DIAGNOSIS — F32A Depression, unspecified: Secondary | ICD-10-CM | POA: Diagnosis present

## 2017-12-13 DIAGNOSIS — Z85820 Personal history of malignant melanoma of skin: Secondary | ICD-10-CM | POA: Diagnosis not present

## 2017-12-13 DIAGNOSIS — Z9013 Acquired absence of bilateral breasts and nipples: Secondary | ICD-10-CM | POA: Diagnosis not present

## 2017-12-13 DIAGNOSIS — F419 Anxiety disorder, unspecified: Secondary | ICD-10-CM | POA: Diagnosis present

## 2017-12-13 DIAGNOSIS — Z8571 Personal history of Hodgkin lymphoma: Secondary | ICD-10-CM | POA: Diagnosis not present

## 2017-12-13 DIAGNOSIS — Z91048 Other nonmedicinal substance allergy status: Secondary | ICD-10-CM | POA: Diagnosis not present

## 2017-12-13 DIAGNOSIS — Z800 Family history of malignant neoplasm of digestive organs: Secondary | ICD-10-CM | POA: Diagnosis not present

## 2017-12-13 DIAGNOSIS — J9811 Atelectasis: Secondary | ICD-10-CM | POA: Diagnosis not present

## 2017-12-13 DIAGNOSIS — Z86711 Personal history of pulmonary embolism: Secondary | ICD-10-CM | POA: Diagnosis not present

## 2017-12-13 DIAGNOSIS — J90 Pleural effusion, not elsewhere classified: Secondary | ICD-10-CM | POA: Diagnosis not present

## 2017-12-13 DIAGNOSIS — E860 Dehydration: Secondary | ICD-10-CM | POA: Diagnosis present

## 2017-12-13 DIAGNOSIS — Z881 Allergy status to other antibiotic agents status: Secondary | ICD-10-CM | POA: Diagnosis not present

## 2017-12-13 DIAGNOSIS — E039 Hypothyroidism, unspecified: Secondary | ICD-10-CM | POA: Diagnosis present

## 2017-12-13 DIAGNOSIS — Z803 Family history of malignant neoplasm of breast: Secondary | ICD-10-CM | POA: Diagnosis not present

## 2017-12-13 DIAGNOSIS — Z9689 Presence of other specified functional implants: Secondary | ICD-10-CM | POA: Diagnosis not present

## 2017-12-13 DIAGNOSIS — I4581 Long QT syndrome: Secondary | ICD-10-CM | POA: Diagnosis not present

## 2017-12-13 DIAGNOSIS — Z85850 Personal history of malignant neoplasm of thyroid: Secondary | ICD-10-CM | POA: Diagnosis not present

## 2017-12-13 DIAGNOSIS — Z79899 Other long term (current) drug therapy: Secondary | ICD-10-CM | POA: Diagnosis not present

## 2017-12-13 DIAGNOSIS — Z923 Personal history of irradiation: Secondary | ICD-10-CM | POA: Diagnosis not present

## 2017-12-13 DIAGNOSIS — Z87440 Personal history of urinary (tract) infections: Secondary | ICD-10-CM | POA: Diagnosis not present

## 2017-12-13 DIAGNOSIS — K219 Gastro-esophageal reflux disease without esophagitis: Secondary | ICD-10-CM | POA: Diagnosis not present

## 2017-12-13 DIAGNOSIS — Z4682 Encounter for fitting and adjustment of non-vascular catheter: Secondary | ICD-10-CM | POA: Diagnosis not present

## 2017-12-13 DIAGNOSIS — J9383 Other pneumothorax: Secondary | ICD-10-CM | POA: Diagnosis not present

## 2017-12-13 DIAGNOSIS — F329 Major depressive disorder, single episode, unspecified: Secondary | ICD-10-CM | POA: Diagnosis present

## 2017-12-13 LAB — CBC
HCT: 36.8 % (ref 36.0–46.0)
Hemoglobin: 12.6 g/dL (ref 12.0–15.0)
MCH: 31.7 pg (ref 26.0–34.0)
MCHC: 34.2 g/dL (ref 30.0–36.0)
MCV: 92.5 fL (ref 78.0–100.0)
PLATELETS: 379 10*3/uL (ref 150–400)
RBC: 3.98 MIL/uL (ref 3.87–5.11)
RDW: 13.9 % (ref 11.5–15.5)
WBC: 7.3 10*3/uL (ref 4.0–10.5)

## 2017-12-13 LAB — BASIC METABOLIC PANEL
Anion gap: 11 (ref 5–15)
BUN: 20 mg/dL (ref 6–20)
CALCIUM: 8.8 mg/dL — AB (ref 8.9–10.3)
CHLORIDE: 106 mmol/L (ref 101–111)
CO2: 24 mmol/L (ref 22–32)
Creatinine, Ser: 1.13 mg/dL — ABNORMAL HIGH (ref 0.44–1.00)
GFR calc non Af Amer: 58 mL/min — ABNORMAL LOW (ref >60)
Glucose, Bld: 102 mg/dL — ABNORMAL HIGH (ref 65–99)
Potassium: 3.5 mmol/L (ref 3.5–5.1)
Sodium: 141 mmol/L (ref 135–145)

## 2017-12-13 LAB — SODIUM, URINE, RANDOM: Sodium, Ur: 152 mmol/L

## 2017-12-13 LAB — TROPONIN I

## 2017-12-13 LAB — PROTIME-INR
INR: 1.09
Prothrombin Time: 14 seconds (ref 11.4–15.2)

## 2017-12-13 LAB — CREATININE, URINE, RANDOM: Creatinine, Urine: 51.92 mg/dL

## 2017-12-13 LAB — MRSA PCR SCREENING: MRSA by PCR: POSITIVE — AB

## 2017-12-13 MED ORDER — ACETAMINOPHEN 325 MG PO TABS
650.0000 mg | ORAL_TABLET | Freq: Four times a day (QID) | ORAL | Status: DC | PRN
  Administered 2017-12-15: 650 mg via ORAL
  Filled 2017-12-13 (×2): qty 2

## 2017-12-13 MED ORDER — IOPAMIDOL (ISOVUE-370) INJECTION 76%
100.0000 mL | Freq: Once | INTRAVENOUS | Status: AC | PRN
  Administered 2017-12-13: 100 mL via INTRAVENOUS

## 2017-12-13 MED ORDER — FENTANYL CITRATE (PF) 100 MCG/2ML IJ SOLN
25.0000 ug | INTRAMUSCULAR | Status: DC | PRN
  Administered 2017-12-13 – 2017-12-17 (×16): 25 ug via INTRAVENOUS
  Filled 2017-12-13 (×16): qty 2

## 2017-12-13 MED ORDER — OXYCODONE-ACETAMINOPHEN 5-325 MG PO TABS
1.0000 | ORAL_TABLET | ORAL | Status: DC | PRN
  Filled 2017-12-13: qty 1

## 2017-12-13 MED ORDER — ZOLPIDEM TARTRATE 5 MG PO TABS: 5.0000 mg | ORAL_TABLET | Freq: Every evening | ORAL | Status: DC | PRN

## 2017-12-13 MED ORDER — SENNOSIDES-DOCUSATE SODIUM 8.6-50 MG PO TABS
1.0000 | ORAL_TABLET | Freq: Every evening | ORAL | Status: DC | PRN
  Filled 2017-12-13: qty 1

## 2017-12-13 MED ORDER — FENTANYL CITRATE (PF) 100 MCG/2ML IJ SOLN
25.0000 ug | Freq: Once | INTRAMUSCULAR | Status: AC
  Administered 2017-12-13: 25 ug via INTRAVENOUS
  Filled 2017-12-13: qty 2

## 2017-12-13 MED ORDER — LEVOTHYROXINE SODIUM 75 MCG PO TABS
75.0000 ug | ORAL_TABLET | Freq: Every day | ORAL | Status: DC
  Administered 2017-12-13: 75 ug via ORAL
  Filled 2017-12-13: qty 1

## 2017-12-13 MED ORDER — VITAMIN D (ERGOCALCIFEROL) 1.25 MG (50000 UNIT) PO CAPS
50000.0000 [IU] | ORAL_CAPSULE | ORAL | Status: DC
  Administered 2017-12-16: 50000 [IU] via ORAL
  Filled 2017-12-13: qty 1

## 2017-12-13 MED ORDER — PANTOPRAZOLE SODIUM 40 MG PO TBEC
40.0000 mg | DELAYED_RELEASE_TABLET | Freq: Every day | ORAL | Status: DC
  Administered 2017-12-13 – 2017-12-17 (×4): 40 mg via ORAL
  Filled 2017-12-13 (×5): qty 1

## 2017-12-13 MED ORDER — PRASTERONE 6.5 MG VA INST
1.0000 "application " | VAGINAL_INSERT | Freq: Every day | VAGINAL | Status: DC
  Filled 2017-12-13 (×2): qty 1

## 2017-12-13 MED ORDER — DM-GUAIFENESIN ER 30-600 MG PO TB12
1.0000 | ORAL_TABLET | Freq: Two times a day (BID) | ORAL | Status: DC | PRN
Start: 2017-12-13 — End: 2017-12-18

## 2017-12-13 MED ORDER — SCOPOLAMINE 1 MG/3DAYS TD PT72
1.0000 | MEDICATED_PATCH | TRANSDERMAL | Status: DC
  Administered 2017-12-13 – 2017-12-16 (×2): 1.5 mg via TRANSDERMAL
  Filled 2017-12-13 (×2): qty 1

## 2017-12-13 MED ORDER — TURMERIC 500 MG PO CAPS: 65.0000 mg | ORAL_CAPSULE | Freq: Every day | ORAL | Status: DC

## 2017-12-13 MED ORDER — HYDROMORPHONE HCL 1 MG/ML IJ SOLN
0.5000 mg | INTRAMUSCULAR | Status: DC | PRN
Start: 2017-12-13 — End: 2017-12-18
  Administered 2017-12-13 – 2017-12-18 (×2): 0.5 mg via INTRAVENOUS
  Filled 2017-12-13 (×2): qty 0.5

## 2017-12-13 MED ORDER — SODIUM CHLORIDE 0.9 % IV SOLN
INTRAVENOUS | Status: DC
  Administered 2017-12-13 – 2017-12-14 (×4): via INTRAVENOUS

## 2017-12-13 MED ORDER — COLLAGEN 500 MG PO CAPS: 1.0000 | ORAL_CAPSULE | Freq: Every day | ORAL | Status: DC

## 2017-12-13 MED ORDER — ONDANSETRON HCL 4 MG/2ML IJ SOLN
4.0000 mg | Freq: Four times a day (QID) | INTRAMUSCULAR | Status: DC
  Administered 2017-12-13 – 2017-12-18 (×16): 4 mg via INTRAVENOUS
  Filled 2017-12-13 (×17): qty 2

## 2017-12-13 MED ORDER — BIOTIN 2500 MCG PO CAPS: 5000.0000 ug | ORAL_CAPSULE | Freq: Every day | ORAL | Status: DC

## 2017-12-13 MED ORDER — SUMATRIPTAN SUCCINATE 50 MG PO TABS
50.0000 mg | ORAL_TABLET | ORAL | Status: DC | PRN
  Filled 2017-12-13 (×2): qty 1

## 2017-12-13 MED ORDER — FENTANYL CITRATE (PF) 100 MCG/2ML IJ SOLN
50.0000 ug | INTRAMUSCULAR | Status: DC | PRN
  Administered 2017-12-13: 50 ug via INTRAVENOUS
  Filled 2017-12-13: qty 2

## 2017-12-13 MED ORDER — ANASTROZOLE 1 MG PO TABS
1.0000 mg | ORAL_TABLET | Freq: Every day | ORAL | Status: DC
  Administered 2017-12-15 – 2017-12-17 (×3): 1 mg via ORAL
  Filled 2017-12-13 (×5): qty 1

## 2017-12-13 MED ORDER — ENOXAPARIN SODIUM 40 MG/0.4ML ~~LOC~~ SOLN
40.0000 mg | SUBCUTANEOUS | Status: DC
  Administered 2017-12-13 – 2017-12-17 (×5): 40 mg via SUBCUTANEOUS
  Filled 2017-12-13 (×6): qty 0.4

## 2017-12-13 MED ORDER — HYDROXYZINE HCL 10 MG PO TABS
10.0000 mg | ORAL_TABLET | Freq: Three times a day (TID) | ORAL | Status: DC | PRN
  Administered 2017-12-13: 10 mg via ORAL
  Filled 2017-12-13: qty 1

## 2017-12-13 MED ORDER — LORATADINE 10 MG PO TABS
10.0000 mg | ORAL_TABLET | Freq: Every day | ORAL | Status: DC
  Administered 2017-12-13 – 2017-12-17 (×2): 10 mg via ORAL
  Filled 2017-12-13 (×3): qty 1

## 2017-12-13 MED ORDER — MORPHINE SULFATE (PF) 2 MG/ML IV SOLN
2.0000 mg | INTRAVENOUS | Status: DC | PRN
  Administered 2017-12-13: 2 mg via INTRAVENOUS
  Filled 2017-12-13: qty 1

## 2017-12-13 MED ORDER — BUPROPION HCL ER (XL) 150 MG PO TB24
450.0000 mg | ORAL_TABLET | Freq: Every day | ORAL | Status: DC
  Filled 2017-12-13: qty 3

## 2017-12-13 MED ORDER — CALCIUM CARBONATE 1250 (500 CA) MG PO TABS
1.0000 | ORAL_TABLET | Freq: Every day | ORAL | Status: DC
  Administered 2017-12-13 – 2017-12-17 (×4): 500 mg via ORAL
  Filled 2017-12-13 (×3): qty 1

## 2017-12-13 MED ORDER — LEVOTHYROXINE SODIUM 25 MCG PO TABS
175.0000 ug | ORAL_TABLET | Freq: Every day | ORAL | Status: DC
  Administered 2017-12-13 – 2017-12-17 (×5): 175 ug via ORAL
  Filled 2017-12-13 (×7): qty 1

## 2017-12-13 MED ORDER — ONDANSETRON HCL 4 MG/2ML IJ SOLN: 4.0000 mg | Freq: Three times a day (TID) | INTRAMUSCULAR | Status: DC | PRN

## 2017-12-13 NOTE — ED Notes (Signed)
Pt currently in CT.

## 2017-12-13 NOTE — ED Notes (Signed)
[facility] here to transport pt to [facility]. Stable at transfer.

## 2017-12-13 NOTE — ED Notes (Signed)
Pt c/o left should pain and under left breast. No sob. resp even and unlabored Sinus on monitor. 02sat 100%

## 2017-12-13 NOTE — Progress Notes (Signed)
Pt continues to experience pain in shoulders, with increasing need for pain/nausea medication as day progressed. Intermittent nausea. Migraine headache started 16:00, deescalated with environmental changes.

## 2017-12-13 NOTE — Consult Note (Signed)
[address]       [place] [address]             [phone]          CARDIOTHORACIC SURGERY CONSULTATION REPORT  PCP is [name], MD Referring Provider is Veatrice Kells, MD  Reason for consultation:  Recurrent spontaneous pneumothorax  HPI:  Patient is a 47 year old female with history of recurrent primary spontaneous pneumothorax status post left thoracotomy for bleb resection in the remote past, multiple malignancies including melanoma, breast cancer, thyroid cancer, and Hodgkin's disease treated with combined chemotherapy and radiation therapy to the chest, pulmonary embolism, GE reflux disease, hypothyroidism, and depression who has been referred for surgical consultation for management of recurrent left spontaneous pneumothorax.  The patient states that she developed Hodgkin's disease during her teenage years and was treated with combined chemotherapy and mediastinal radiation therapy.  In 1990 she suffered left primary spontaneous pneumothorax treated successfully with chest tube placement.  Approximately 1 year later she experienced recurrent left primary spontaneous pneumothorax.  She underwent left thoracotomy for bleb resection and pleurodesis by Dr. Truman Hayward.  She has not had any subsequent episodes of spontaneous pneumothorax until presently.  The patient recently was traveling in [place] for vacation.  Yesterday morning she developed sudden onset of mild pain in her left chest and neck.  Symptoms became associated with shortness of breath that seem to be positional, and noticeable primarily when the patient leaned forward.  The patient and her husband return to the [place] via airplane yesterday, and during the trip the patient states that she felt well and experienced no increased symptoms of shortness of breath.  Yesterday evening when she leaned forward she noticed increased shortness of breath and discomfort in her chest, prompting her to  present to the emergency department.  Chest x-ray revealed left basilar pneumothorax.  CT angiogram of the chest was negative for pulmonary embolus but again demonstrated the pneumothorax which appeared to be partially loculated.  The patient was admitted to the hospital and cardiothoracic surgical consultation was requested.  Patient reports that up until yesterday morning she was in her usual state of health.  She is a non-smoker and does not use smokeless tobacco or other inhaled products.  She has not experienced any recent physical trauma of any kind.  She has not had fevers, chills, or productive cough.  She states that presently she has mild discomfort in her left chest which is exacerbated by deep breath and cough.  She is not currently short of breath at all but she admits that she has not been active at all since she presented to the emergency room.  Past Medical History:  Diagnosis Date  . Allergy   . Anxiety   . Breast cancer ([place])   . Cancer [facility]) 2003   papillary thyroid cancer  . Cancer ([place]) 1986   Hodgkins lymphoma  . Carcinoma of thyroid ([place])   . Depression   . Family history of breast cancer   . Family history of colon cancer   . Family history of ovarian cancer   . Family history of prostate cancer   . GERD (gastroesophageal reflux disease)   . History of chicken pox   . Hodgkin's disease   . Hx: UTI (urinary tract infection)   . Increased frequency of headaches   . Left breast mass   . Migraines   . PONV (postoperative nausea and vomiting)   . Primary  spontaneous pneumothorax 1990   left  . Pulmonary embolism ([place]) 11/30/2015  . Recurrent spontaneous pneumothorax 1991   left  . Skin cancer 2003   Melanoma  . Thyroid disease     Past Surgical History:  Procedure Laterality Date  . CHEST TUBE INSERTION  1990   collasped lung, spontaneous  . FOOT TENDON SURGERY Left   . LAPAROSCOPIC SPLENECTOMY  1987   Hodgkins Disease  . MASTECTOMY     BIL  .  THORACOTOMY Left 1991   recurrent spontaneous pneumothorax - Dr Truman Hayward  . THYROIDECTOMY  2004   nodules, carcinoma  . URETEROPLASTY  1977   malformed ureter    Family History  Problem Relation Age of Onset  . Alcohol abuse Maternal Aunt   . Prostate cancer Maternal Uncle 56  . Colon cancer Maternal Grandmother        dx in her 82s  . Prostate cancer Maternal Grandfather        dx in his 2s  . Cancer Other        FH of Breast Cancer, Ovarian/Uterine Cancer  . Heart disease Maternal Uncle        12s  . Breast cancer Other        MGFs sister - reportedly BRCA+  . Ovarian cancer Other        PGFs mother  . Ovarian cancer Other        PGFs maternal aunt  . Ovarian cancer Other        PGFs maternal grandmohter  . Breast cancer Other        MGMs sister  . Breast cancer Other        MGFs mother    Social History   Socioeconomic History  . Marital status: Married    Spouse name: Not on file  . Number of children: 2  . Years of education: 78  . Highest education level: Not on file  Occupational History  . Occupation: Chief Strategy Officer  Social Needs  . Financial resource strain: Not on file  . Food insecurity:    Worry: Not on file    Inability: Not on file  . Transportation needs:    Medical: Not on file    Non-medical: Not on file  Tobacco Use  . Smoking status: Never Smoker  . Smokeless tobacco: Never Used  Substance and Sexual Activity  . Alcohol use: Yes    Comment: social  . Drug use: No  . Sexual activity: Yes    Birth control/protection: IUD  Lifestyle  . Physical activity:    Days per week: Not on file    Minutes per session: Not on file  . Stress: Not on file  Relationships  . Social connections:    Talks on phone: Not on file    Gets together: Not on file    Attends religious service: Not on file    Active member of club or organization: Not on file    Attends meetings of clubs or organizations: Not on file    Relationship status: Not on file    . Intimate partner violence:    Fear of current or ex partner: Not on file    Emotionally abused: Not on file    Physically abused: Not on file    Forced sexual activity: Not on file  Other Topics Concern  . Not on file  Social History Narrative   Regular exercise-no    Prior to Admission medications   Medication  Sig Start Date End Date Taking? Authorizing Provider  AMBULATORY NON FORMULARY MEDICATION Myofascial release therapy and massage therapy 2-3 times a week as needed 09/22/17   [name], MD  anastrozole (ARIMIDEX) 1 MG tablet TAKE 1 TABLET(1 MG) BY MOUTH DAILY 09/21/17   Gardenia Phlegm, NP  Biotin 2500 MCG CAPS Take 5,000 mcg by mouth daily. 03/23/17   [name], MD  buPROPion (WELLBUTRIN XL) 300 MG 24 hr tablet TAKE 1 TABLET(300 MG) BY MOUTH DAILY 11/23/17   [name], MD  buPROPion HCl ER, XL, 450 MG TB24 Take 450 mg by mouth daily. 09/23/17   Gardenia Phlegm, NP  CALCIUM PO Take by mouth.    [provider]  cetirizine (ZYRTEC) 5 MG tablet Take 1 tablet (5 mg total) by mouth daily. 10/15/15   [name], MD  Cholecalciferol (VITAMIN D3) 10000 UNITS capsule Take 10,000 Units by mouth daily. Only 5 days a week     [provider]  COLLAGEN PO Take by mouth daily.    [provider]  esomeprazole (NEXIUM) 40 MG capsule Take 1 capsule (40 mg total) by mouth daily at 12 noon. 06/26/16   [name], MD  fexofenadine-pseudoephedrine (ALLEGRA-D 24) 180-240 MG 24 hr tablet Take 1 tablet by mouth daily.    [provider]  levothyroxine (SYNTHROID, LEVOTHROID) 75 MCG tablet Take 75 mcg by mouth daily.      [provider]  Prasterone (INTRAROSA) 6.5 MG INST Place 1 application vaginally daily. 09/21/17   Gardenia Phlegm, NP  Turmeric (CURCUMIN 95 PO) Take 65 mg by mouth daily.    [provider]  ZOLMitriptan (ZOMIG) 2.5 MG tablet Take 2.5 mg by mouth as needed.      [provider]    Current Facility-Administered Medications  Medication Dose Route Frequency Provider Last Rate Last Dose  . 0.9 %  sodium chloride infusion   Intravenous Continuous Ivor Costa, MD 125 mL/hr at 12/13/17 0136    . acetaminophen (TYLENOL) tablet 650 mg  650 mg Oral Q6H PRN Ivor Costa, MD      . anastrozole (ARIMIDEX) tablet 1 mg  1 mg Oral Daily Ivor Costa, MD      . calcium carbonate (OS-CAL - dosed in mg of elemental calcium) tablet 500 mg of elemental calcium  1 tablet Oral Daily Ivor Costa, MD      . dextromethorphan-guaiFENesin ([place] DM) 30-600 MG per 12 hr tablet 1 tablet  1 tablet Oral BID PRN Ivor Costa, MD      . hydrOXYzine (ATARAX/VISTARIL) tablet 10 mg  10 mg Oral TID PRN Ivor Costa, MD      . levothyroxine (SYNTHROID, LEVOTHROID) tablet 75 mcg  75 mcg Oral QAC breakfast Ivor Costa, MD      . loratadine (CLARITIN) tablet 10 mg  10 mg Oral Daily Ivor Costa, MD      . morphine 2 MG/ML injection 2 mg  2 mg Intravenous Q3H PRN Ivor Costa, MD      . oxyCODONE-acetaminophen (PERCOCET/ROXICET) 5-325 MG per tablet 1 tablet  1 tablet Oral Q4H PRN Ivor Costa, MD      . pantoprazole (PROTONIX) EC tablet 40 mg  40 mg Oral Daily Ivor Costa, MD      . Prasterone INST 1 application  1 application Vaginal Daily Ivor Costa, MD      . senna-docusate (Senokot-S) tablet 1 tablet  1 tablet Oral QHS PRN Ivor Costa, MD      .  SUMAtriptan (IMITREX) tablet 50 mg  50 mg Oral Q2H PRN Ivor Costa, MD      . Derrill Memo ON 12/16/2017] Vitamin D (Ergocalciferol) (DRISDOL) capsule 50,000 Units  50,000 Units Oral Q Micheline Chapman, Soledad Gerlach, MD      . zolpidem (AMBIEN) tablet 5 mg  5 mg Oral QHS PRN Ivor Costa, MD        Allergies  Allergen Reactions  . Other Rash    "bandaides"  . Macrodantin   . Pertussis Vaccines   . Tape Rash    "plastic tape"      Review of Systems:   General:  normal appetite, normal energy, no weight gain, no weight loss, no fever  Cardiac:  no chest pain with exertion, +  chest pain at rest, + SOB with exertion, no resting SOB, no PND, no orthopnea, no palpitations, no arrhythmia, no atrial fibrillation, no LE edema, no dizzy spells, no syncope  Respiratory:  + exertional shortness of breath, no home oxygen, no productive cough, no dry cough, no bronchitis, no wheezing, no hemoptysis, no asthma, + pain with inspiration or cough, no sleep apnea, no CPAP at night  GI:   no difficulty swallowing, no reflux, no frequent heartburn, no hiatal hernia, no abdominal pain, no constipation, no diarrhea, no hematochezia, no hematemesis, no melena  GU:   no dysuria,  no frequency, no urinary tract infection, no hematuria, no kidney stones, no kidney disease  Vascular:  no pain suggestive of claudication, no pain in feet, no leg cramps, no varicose veins, no DVT, no non-healing foot ulcer  Neuro:   no stroke, no TIA's, no seizures, no headaches, no temporary blindness one eye,  no slurred speech, no peripheral neuropathy, no chronic pain, no instability of gait, no memory/cognitive dysfunction  Musculoskeletal: no arthritis , no joint swelling, no myalgias, no difficulty walking, normal mobility   Skin:   no rash, no itching, no skin infections, no pressure sores or ulcerations  Psych:   no anxiety, + depression, no nervousness, no unusual recent stress  Eyes:   no blurry vision, no floaters, no recent vision changes, + wears glasses or contacts  ENT:   no hearing loss, no loose or painful teeth, no dentures  Hematologic:  no easy bruising, no abnormal bleeding, no clotting disorder, no frequent epistaxis  Endocrine:  no diabetes, does not check CBG's at home     Physical Exam:   BP 125/68 (BP Location: Right Arm)   Pulse 85   Temp 97.9 F (36.6 C) (Oral)   Resp 16   Ht 5' 4"  (1.626 m)   Wt 175 lb 14.8 oz (79.8 kg)   LMP 02/29/2016   SpO2 100%   BMI 30.20 kg/m   General:    well-appearing  HEENT:  Unremarkable   Neck:   no JVD, no bruits, no adenopathy    Chest:   clear to auscultation, slightly diminished breath sounds on left side, no wheezes, no rhonchi   CV:   RRR, no  murmur   Abdomen:  soft, non-tender, no masses   Extremities:  warm, well-perfused, + pulses , no lower extremity edema  Rectal/GU  Deferred  Neuro:   Grossly non-focal and symmetrical throughout  Skin:   Clean and dry, no rashes, no breakdown  Diagnostic Tests:  Lab Results: Recent Labs    12/13/17 0031  WBC 7.3  HGB 12.6  HCT 36.8  PLT 379   BMET:  Recent Labs    12/13/17 0031  NA 141  K 3.5  CL 106  CO2 24  GLUCOSE 102*  BUN 20  CREATININE 1.13*  CALCIUM 8.8*    CBG (last 3)  No results for input(s): GLUCAP in the last 72 hours. PT/INR:  No results for input(s): LABPROT, INR in the last 72 hours.  CXR:  CHEST - 2 VIEW  COMPARISON:  03/17/2017 PET CT and prior studies  FINDINGS: A moderate to large LEFT basilar pneumothorax is noted. No mediastinal shift.  The cardiomediastinal silhouette is unremarkable.  No pleural effusion, airspace disease or acute bony abnormality.  Breast surgical changes identified.  IMPRESSION: Moderate to large LEFT basilar pneumothorax.  No mediastinal shift.  Critical Value/emergent results were called by telephone at the time of interpretation on 12/13/2017 at 12:05 am to Dr. Veatrice Kells , who verbally acknowledged these results.   Electronically Signed   By: Margarette Canada M.D.   On: 12/13/2017 00:05   CT ANGIOGRAPHY CHEST WITH CONTRAST  TECHNIQUE: Multidetector CT imaging of the chest was performed using the standard protocol during bolus administration of intravenous contrast. Multiplanar CT image reconstructions and MIPs were obtained to evaluate the vascular anatomy.  CONTRAST:  169m ISOVUE-370 IOPAMIDOL (ISOVUE-370) INJECTION 76%  COMPARISON:  Chest radiograph performed 12/12/2017, and PET/CT performed 03/17/2017  FINDINGS: Cardiovascular:  There is no evidence of  pulmonary embolus.  The heart is normal in size. The thoracic aorta is unremarkable. The great vessels are within normal limits.  Mediastinum/Nodes: The mediastinum is unremarkable in appearance. No mediastinal lymphadenopathy is seen. No pericardial effusion is identified. Postoperative change is noted along the anterior mediastinum. The patient is status post thyroidectomy. No axillary lymphadenopathy is seen. Scattered clips are seen at the axilla bilaterally.  Lungs/Pleura: A large left-sided pneumothorax is noted. There is residual partial expansion of the left upper and lower lobes. Mild bilateral atelectasis is noted. No mass is seen.  Upper Abdomen: The visualized portions of the liver and residual splenule are unremarkable in appearance. The visualized portions of the adrenal glands and kidneys are within normal limits. The pancreas is grossly unremarkable.  Musculoskeletal: No acute osseous abnormalities are identified. The visualized musculature is unremarkable in appearance.  Review of the MIP images confirms the above findings.  IMPRESSION: 1. No evidence of pulmonary embolus. 2. Large left-sided pneumothorax noted, with residual partial expansion of the left upper and lower lung lobes. 3. Mild bilateral atelectasis noted.  Critical Value/emergent results were called by telephone at the time of interpretation on 12/13/2017 at 1:46 am to Dr. AVeatrice Kells who verbally acknowledged these results.   Electronically Signed   By: JGarald BaldingM.D.   On: 12/13/2017 01:53     Impression:  Patient has recurrent left primary spontaneous pneumothorax in the setting of previous left thoracotomy for bleb resection on the same side in the remote past.  The patient also has history of mediastinal radiation therapy to the chest in the distant past for treatment of Hodgkin's lymphoma.  At present the patient is comfortable with minimal pain and no shortness of  breath.  I personally reviewed the patient's chest x-ray and CT scan performed at the time of admission.  The patient has a moderate size left basilar pneumothorax.  Approximately 50% of the left lung appears to be adherent to the chest wall, particularly posteriorly and superiorly.  This undoubtedly is related to the patient's previous left thoracotomy and therefore likely will prevent further collapse of the lung.  Options include placement of chest tube  with or without radiographic guidance versus continued observation.  Because of the patient's previous surgery I would not consider redo thoracotomy unless the patient continued to experience multiple recurrent episodes.     Plan:  I discussed the nature of the patient's recurrent pneumothorax at length with the patient and her husband at the bedside.  We discussed treatment options at length including continued observation versus proceeding with chest tube placement with or without radiographic guidance.  Because the patient is quite comfortable at present and without shortness of breath she desires to hold off on chest tube placement if possible.  We will obtain a repeat chest x-ray this morning.  If the pneumothorax has enlarged any further I think it would be wise to proceed with chest tube placement.  On the other hand if the x-ray remained stable and/or improved it might be possible to treat her pneumothorax expectantly.  We will continue to follow.  Given the patient's history I would recommend pharmacologic DVT prophylaxis using Lovenox.  I spent in excess of 60 minutes during the conduct of this hospital consultation and >50% of this time involved direct face-to-face encounter for counseling and/or coordination of the patient's care.    Valentina Gu. Roxy Manns, MD 12/13/2017 8:06 AM

## 2017-12-13 NOTE — ED Notes (Signed)
Purewick placed on patient for comfort.

## 2017-12-13 NOTE — ED Notes (Signed)
Report given to the floor. 

## 2017-12-13 NOTE — Care Management (Signed)
This is a no charge note  Transfer from [facility] per Dr. Beaulah Corin  47 year old lady with multiple malignancy (melanoma, breast cancer, Hodgkin disease, thyroid cancer), PE, GERD, hypothyroidism, depression, who presents with neck pain, shoulder pain and left lower chest pain.    CT angiogram is negative for PE, but showed large left pneumothorax.  Patient is tachycardia, oxygen saturation 97% on room air, hemodynamically stable. Per dr. Randal Buba, "Case d/w Dr. Roxy Manns of thoracic surgery, if stable admit to medicine and he will follow and repeat CXR in am at 6 am (PA/lateral) and if persistent, will have IR placement of chest tube.  If improved may follow clinically".  Pt is admitted to SDU as inpt. I told EDP that although we are accepting the patient, yet we do not have SDU currently. Patient may not be able to get here in a timely fashion.  Please call manager of [facility] at [phone] when pt arrives to floor   Ivor Costa, MD  [facility] Pager [phone]  If 7PM-7AM, please contact night-coverage [email or web address] Password [email or web address] 12/13/2017, 2:35 AM

## 2017-12-13 NOTE — Progress Notes (Signed)
47 year old female admitted early this morning with recurrent spontaneous pneumothorax on the left with history of multiple malignancies including breast cancer, thyroid cancer, Hodgkin's lymphoma, melanoma treated with chemo and radiation, history of pulmonary embolism admitted with pneumothorax.  Chest x-ray done from today shows stable pneumothorax.  Reviewed cardiothoracic surgery consultation.  Follow-up chest x-ray tomorrow.  Started Lovenox for DVT prophylaxis.

## 2017-12-13 NOTE — H&P (Addendum)
History and Physical    EMERY BINZ OVF:[id number] DOB: [birth year] DOA: 12/12/2017  Referring MD/NP/PA:   PCP: [name], MD   Patient coming from:  The patient is coming from home.  At baseline, pt is independent for most of ADL. SNF  Assistant living facility   [facility]irement center.       Chief Complaint: chest pain and SOB  HPI: Stacy Chung is a 47 y.o. female with medical history significant of multiple malignancy (melanoma, breast cancer, Hodgkin disease, thyroid cancer), PE, GERD, hypothyroidism, depression, who presents with chest pain and SOB.  Pt states that she initially started having left neck and shoulder pain in am on Saturday while patient was in [place], then started having left lower chest pain.  The pain is constant, sharp, 8 out of 10 in severity, nonradiating.  It is associated with mild shortness of breath.  It is aggravated by laying flat and leaning forward.  No cough, fever or chills. Patient denies nausea, vomiting, diarrhea, abdominal pain, symptoms of UTI or unilateral weakness. Of note, pt has hx of at least 2 previous pneumothoraces, requiring thoracotomy.  ED Course: pt was found to have WBC 7.3, negative troponin, acute renal injury with creatinine 1.13, temperature normal, tachycardia, tachypnea, oxygen saturation 97% on room air, chest x-ray showed left large pneumothorax. CT angiogram is negative for PE, but showed large left pneumothorax.  Patient is admitted to stepdown as inpatient.  Thoracic surgery, Dr. Roxy Manns was consulted.  Review of Systems:   General: no fevers, chills, no body weight gain, has fatigue HEENT: no blurry vision, hearing changes or sore throat Respiratory: has dyspnea, no coughing, wheezing CV: has chest pain, no palpitations GI: no nausea, vomiting, abdominal pain, diarrhea, constipation GU: no dysuria, burning on urination, increased urinary frequency, hematuria  Ext: no leg edema Neuro: no unilateral weakness,  numbness, or tingling, no vision change or hearing loss Skin: no rash, no skin tear. MSK: No muscle spasm, no deformity, no limitation of range of movement in spin Heme: No easy bruising.  Travel history: No recent long distant travel.  Allergy:  Allergies  Allergen Reactions  . Other Rash    "bandaides"  . Macrodantin   . Pertussis Vaccines   . Tape Rash    "plastic tape"    Past Medical History:  Diagnosis Date  . Allergy   . Anxiety   . Breast cancer ([place])   . 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 Cancer ([place]) 1986   Hodgkins lymphoma  . Carcinoma of thyroid ([place])   . Depression   . Family history of breast cancer   . Family history of colon cancer   . Family history of ovarian cancer   . Family history of prostate cancer   . GERD (gastroesophageal reflux disease)   . History of chicken pox   . Hodgkin's disease   . Hx: UTI (urinary tract infection)   . Increased frequency of headaches   . Left breast mass   . Migraines   . Pneumothorax   . PONV (postoperative nausea and vomiting)   . Pulmonary embolism ([place]) 11/30/2015  . Skin cancer 2003   Melanoma  . Thyroid disease     Past Surgical History:  Procedure Laterality Date  . FOOT TENDON SURGERY Left   . LAPAROSCOPIC SPLENECTOMY  1987   Hodgkins Disease  . LUNG SURGERY    . [place]  collasped lung, spontaneous  . THYROIDECTOMY  2004   nodules, carcinoma  . URETEROPLASTY  1977   malformed ureter    Social History:  reports that she has never smoked. She has never used smokeless tobacco. She reports that she drinks alcohol. She reports that she does not use drugs.  Family History:  Family History  Problem Relation Age of Onset  . Alcohol abuse Maternal Aunt   . Prostate cancer Maternal Uncle 20  . Colon cancer Maternal Grandmother        dx in her 29s  . Prostate cancer Maternal Grandfather        dx in his 69s  . Cancer Other        FH of Breast  Cancer, Ovarian/Uterine Cancer  . Heart disease Maternal Uncle        15s  . Breast cancer Other        MGFs sister - reportedly BRCA+  . Ovarian cancer Other        PGFs mother  . Ovarian cancer Other        PGFs maternal aunt  . Ovarian cancer Other        PGFs maternal grandmohter  . Breast cancer Other        MGMs sister  . Breast cancer Other        MGFs mother     Prior to Admission medications   Medication Sig Start Date End Date Taking? Authorizing Provider  AMBULATORY NON FORMULARY MEDICATION Myofascial release therapy and massage therapy 2-3 times a week as needed 09/22/17   [name], MD  anastrozole (ARIMIDEX) 1 MG tablet TAKE 1 TABLET(1 MG) BY MOUTH DAILY 09/21/17   Gardenia Phlegm, NP  Biotin 2500 MCG CAPS Take 5,000 mcg by mouth daily. 03/23/17   [name], MD  buPROPion (WELLBUTRIN XL) 300 MG 24 hr tablet TAKE 1 TABLET(300 MG) BY MOUTH DAILY 11/23/17   [name], MD  buPROPion HCl ER, XL, 450 MG TB24 Take 450 mg by mouth daily. 09/23/17   Gardenia Phlegm, NP  CALCIUM PO Take by mouth.    [provider]  cetirizine (ZYRTEC) 5 MG tablet Take 1 tablet (5 mg total) by mouth daily. 10/15/15   [name], MD  Cholecalciferol (VITAMIN D3) 10000 UNITS capsule Take 10,000 Units by mouth daily. Only 5 days a week     [provider]  COLLAGEN PO Take by mouth daily.    [provider]  esomeprazole (NEXIUM) 40 MG capsule Take 1 capsule (40 mg total) by mouth daily at 12 noon. 06/26/16   [name], MD  fexofenadine-pseudoephedrine (ALLEGRA-D 24) 180-240 MG 24 hr tablet Take 1 tablet by mouth daily.    [provider]  levothyroxine (SYNTHROID, LEVOTHROID) 75 MCG tablet Take 75 mcg by mouth daily.      [provider]  Prasterone (INTRAROSA) 6.5 MG INST Place 1 application vaginally daily. 09/21/17   Gardenia Phlegm, NP  Turmeric (CURCUMIN 95 PO) Take 65 mg by mouth daily.    [provider]  ZOLMitriptan (ZOMIG) 2.5 MG tablet Take 2.5 mg by mouth as needed.      [provider]    Physical Exam: Vitals:   12/13/17 0445 12/13/17 0451 12/13/17 0500 12/13/17 0613  BP:  123/70 117/73 125/68  Pulse: 78 93 92 85  Resp:  (!) 22 (!) 23 16  Temp:  97.7 F (36.5 C)  97.9 F (36.6 C)  TempSrc:  Oral  Oral  SpO2: 100% 100% 100% 100%  Weight:    79.8 kg (175 lb 14.8 oz)  Height:    _0  (1.626 m)   General: Not in acute distress HEENT:       Eyes: PERRL, EOMI, no scleral icterus.       ENT: No discharge from the ears and nose, no pharynx injection, no tonsillar enlargement.        Neck: No JVD, no bruit, no mass felt. Heme: No neck lymph node enlargement. Cardiac: S1/S2, RRR, No murmurs, No gallops or rubs. Respiratory: No rales, wheezing, rhonchi or rubs. GI: Soft, nondistended, nontender, no rebound pain, no organomegaly, BS present. GU: No hematuria Ext: No pitting leg edema bilaterally. 2+DP/PT pulse bilaterally. Musculoskeletal: No joint deformities, No joint redness or warmth, no limitation of ROM in spin. Skin: No rashes.  Neuro: Alert, oriented X3, cranial nerves II-XII grossly intact, moves all extremities normally.  Psych: Patient is not psychotic, no suicidal or hemocidal ideation.  Labs on Admission: I have personally reviewed following labs and imaging studies  CBC: Recent Labs  Lab 12/13/17 0031  WBC 7.3  HGB 12.6  HCT 36.8  MCV 92.5  PLT 175   Basic Metabolic Panel: Recent Labs  Lab 12/13/17 0031  NA 141  K 3.5  CL 106  CO2 24  GLUCOSE 102*  BUN 20  CREATININE 1.13*  CALCIUM 8.8*   GFR: Estimated Creatinine Clearance: 64.2 mL/min (A) (by C-G formula based on SCr of 1.13 mg/dL (H)). Liver Function Tests: No results for input(s): AST, ALT, ALKPHOS, BILITOT, PROT, ALBUMIN in the last 168 hours. No results for input(s): LIPASE, AMYLASE in the last 168 hours. No results for input(s): AMMONIA in the last 168  hours. Coagulation Profile: No results for input(s): INR, PROTIME in the last 168 hours. Cardiac Enzymes: Recent Labs  Lab 12/13/17 0032  TROPONINI <0.03   BNP (last 3 results) No results for input(s): PROBNP in the last 8760 hours. HbA1C: No results for input(s): HGBA1C in the last 72 hours. CBG: No results for input(s): GLUCAP in the last 168 hours. Lipid Profile: No results for input(s): CHOL, HDL, LDLCALC, TRIG, CHOLHDL, LDLDIRECT in the last 72 hours. Thyroid Function Tests: No results for input(s): TSH, T4TOTAL, FREET4, T3FREE, THYROIDAB in the last 72 hours. Anemia Panel: No results for input(s): VITAMINB12, FOLATE, FERRITIN, TIBC, IRON, RETICCTPCT in the last 72 hours. Urine analysis: No results found for: COLORURINE, APPEARANCEUR, LABSPEC, PHURINE, GLUCOSEU, HGBUR, BILIRUBINUR, KETONESUR, PROTEINUR, UROBILINOGEN, NITRITE, LEUKOCYTESUR Sepsis Labs: _1 (procalcitonin:4,lacticidven:4) )No results found for this or any previous visit (from the past 240 hour(s)).   Radiological Exams on Admission: Dg Chest 2 View  Result Date: 12/13/2017 CLINICAL DATA:  47 year old female with acute chest pain. EXAM: CHEST - 2 VIEW COMPARISON:  03/17/2017 PET CT and prior studies FINDINGS: A moderate to large LEFT basilar pneumothorax is noted. No mediastinal shift. The cardiomediastinal silhouette is unremarkable. No pleural effusion, airspace disease or acute bony abnormality. Breast surgical changes identified. IMPRESSION: Moderate to large LEFT basilar pneumothorax.  No mediastinal shift. Critical Value/emergent results were called by telephone at the time of interpretation on 12/13/2017 at 12:05 am to Dr. Veatrice Kells , who verbally acknowledged these results. Electronically Signed   By: Margarette Canada M.D.   On: 12/13/2017 00:05   Ct Angio Chest Pe W And/or Wo Contrast  Result Date: 12/13/2017 CLINICAL DATA:  Assess left-sided pneumothorax. Personal history of Hodgkin's lymphoma. Assess  for pulmonary  embolus. EXAM: CT ANGIOGRAPHY CHEST WITH CONTRAST TECHNIQUE: Multidetector CT imaging of the chest was performed using the standard protocol during bolus administration of intravenous contrast. Multiplanar CT image reconstructions and MIPs were obtained to evaluate the vascular anatomy. CONTRAST:  183m ISOVUE-370 IOPAMIDOL (ISOVUE-370) INJECTION 76% COMPARISON:  Chest radiograph performed 12/12/2017, and PET/CT performed 03/17/2017 FINDINGS: Cardiovascular:  There is no evidence of pulmonary embolus. The heart is normal in size. The thoracic aorta is unremarkable. The great vessels are within normal limits. Mediastinum/Nodes: The mediastinum is unremarkable in appearance. No mediastinal lymphadenopathy is seen. No pericardial effusion is identified. Postoperative change is noted along the anterior mediastinum. The patient is status post thyroidectomy. No axillary lymphadenopathy is seen. Scattered clips are seen at the axilla bilaterally. Lungs/Pleura: A large left-sided pneumothorax is noted. There is residual partial expansion of the left upper and lower lobes. Mild bilateral atelectasis is noted. No mass is seen. Upper Abdomen: The visualized portions of the liver and residual splenule are unremarkable in appearance. The visualized portions of the adrenal glands and kidneys are within normal limits. The pancreas is grossly unremarkable. Musculoskeletal: No acute osseous abnormalities are identified. The visualized musculature is unremarkable in appearance. Review of the MIP images confirms the above findings. IMPRESSION: 1. No evidence of pulmonary embolus. 2. Large left-sided pneumothorax noted, with residual partial expansion of the left upper and lower lung lobes. 3. Mild bilateral atelectasis noted. Critical Value/emergent results were called by telephone at the time of interpretation on 12/13/2017 at 1:46 am to Dr. AVeatrice Kells who verbally acknowledged these results. Electronically Signed    By: JGarald BaldingM.D.   On: 12/13/2017 01:53     EKG: Independently reviewed.  Sinus rhythm, QTC 511, widening QRS, poor R wave progression  Assessment/Plan Principal Problem:   Pneumothorax on left Active Problems:   Breast cancer of lower-outer quadrant of right female breast (HCC)   Hypothyroidism   GERD (gastroesophageal reflux disease)   Depression   [place]   Pneumothorax on left: as evidenced by CTA and CX currently patient is hemodynamically stable.R.  Oxygen saturation 97% on room air.  Slightly tachycardic. Sh eis comfortable in the bed, no acute distress. Dr. ORoxy Mannsof CPocahontassurgeon was consulted. Per dr. PRandal Buba "Case d/w Dr. ORoxy Mannsof thoracic surgery, if stable admit to medicine and he will follow and repeat CXR in am at 6 am (PA/lateral) and if persistent, will have IR placement of chest tube. If improved may follow clinically".   -will admit to SDU as inpt -repeat CXR now -pain ctrl: prn morphine and percocet -prn mucnex   Hx of  multiple malignancy (melanoma, breast cancer, Hodgkin disease, thyroid cancer): following up with Dr. GLindi Adie Currently is on anastrozole for breast cancer -Continue anastrozole -follow-up with Dr. GLindi Adie Hypothyroidism: Last TSH was not on record -Continue home Synthroid  Depression: Stable, no suicidal or homicidal ideations. -will hold Wellbutrin due QTc prolongation 511  GERD: -Protonix  AKI: Likely due to prerenal secondary to dehydration. - IVF: 125 cc/h NS - Follow up renal function by BMP - Check FeNa   DVT ppx: SCD Code Status: Full code Family Communication: None at bed side.     Disposition Plan:  Anticipate discharge back to previous home environment Consults called:  Dr. ORoxy Mannsof CMooresvillesurgeon Admission status:   SDU/inpation       Date of Service 12/13/2017    XIvor CostaTriad Hospitalists Pager [phone] If 7PM-7AM, please contact night-coverage [email or web address]  Password [email or web address] 12/13/2017, 6:42  AM

## 2017-12-13 NOTE — ED Notes (Signed)
Attempt to call report. Nurse will return call.

## 2017-12-13 NOTE — ED Provider Notes (Signed)
[place] EMERGENCY DEPARTMENT Provider Note   CSN: [id number] Arrival date & time: 12/12/17  2308     History   Chief Complaint Chief Complaint  Patient presents with  . Chest Pain    HPI Stacy Chung is a 47 y.o. female.  The history is provided by the patient.  Chest Pain   This is a new problem. The current episode started 12 to 24 hours ago. The problem occurs constantly. The problem has not changed since onset.The pain is associated with walking. The pain is present in the lateral region. The pain is severe. Quality: nagging  The pain radiates to the left shoulder and left neck. The symptoms are aggravated by deep breathing. Associated symptoms include back pain and shortness of breath. Pertinent negatives include no diaphoresis, no fever, no palpitations and no syncope. She has tried rest for the symptoms. The treatment provided no relief. Risk factors: cancer and previous PTX.  Her past medical history is significant for cancer and PE.  Pertinent negatives for family medical history include: no Marfan's syndrome.  Procedure history is negative for EPS study.  Patient with a h/o breast cancer, Hodgkin's lymphoma, melanoma, thyroid cancer, pulmonary embolism and multiple at least 2 previous pneumothoraces, requiring thoracotomy presents with left neck and shoulder pain that started in the am on Saturday while patient was in [place].  Patient felt left upper back and shoulder pain and then when lifting her back pack to go to the airport had left lower lateral chest pain.  She presents to the ED with concerns that she has a PE. No leg pain or swelling. But flew to [place] a week ago and back today.  No f/c/r.  Of note, patient also reports a h/o LBBB.    Past Medical History:  Diagnosis Date  . Allergy   . Anxiety   . Breast cancer ([place])   . 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 Cancer ([place]) 1986   Hodgkins lymphoma  . Carcinoma of thyroid ([place])   .  Depression   . Family history of breast cancer   . Family history of colon cancer   . Family history of ovarian cancer   . Family history of prostate cancer   . GERD (gastroesophageal reflux disease)   . History of chicken pox   . Hodgkin's disease   . Hx: UTI (urinary tract infection)   . Increased frequency of headaches   . Left breast mass   . Migraines   . Pneumothorax   . PONV (postoperative nausea and vomiting)   . Pulmonary embolism ([place]) 11/30/2015  . Skin cancer 2003   Melanoma  . Thyroid disease     Patient Active Problem List   Diagnosis Date Noted  . Annual physical exam 11/27/2017  . Right wrist pain 11/12/2017  . Rotator cuff syndrome of both shoulders 07/28/2017  . Pulmonary embolism ([place]) 01/29/2016  . Liver lesion 01/03/2016  . Family history of breast cancer   . Family history of ovarian cancer   . Family history of prostate cancer   . Family history of colon cancer   . Skin cancer   . Cancer ([place])   . Genetic testing 11/20/2015  . Ganglion cyst of flexor tendon sheath of finger of left hand 11/19/2015  . Breast cancer of lower-outer quadrant of right female breast ([place]) 09/22/2015  . 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 Peroneus longus tear 11/24/2013  .  Toe swelling 09/06/2013  . Melanoma ([place]) 06/20/2011  . Hodgkin's lymphoma ([place]) 06/20/2011  . Migraine 06/20/2011  . Malignant neoplasm of thyroid gland ([place]) 06/20/2011  . Hyperglycemia 06/20/2011  . Hirsutism 06/20/2011    Past Surgical History:  Procedure Laterality Date  . FOOT TENDON SURGERY Left   . LAPAROSCOPIC SPLENECTOMY  1987   Hodgkins Disease  . LUNG SURGERY    . MASTECTOMY     BIL  . THORACOTOMY  1991   collasped lung, spontaneous  . THYROIDECTOMY  2004   nodules, carcinoma  . URETEROPLASTY  1977   malformed ureter     OB History   None      Home Medications    Prior to Admission medications   Medication Sig Start Date End Date  Taking? Authorizing Provider  AMBULATORY NON FORMULARY MEDICATION Myofascial release therapy and massage therapy 2-3 times a week as needed 09/22/17   [name], MD  anastrozole (ARIMIDEX) 1 MG tablet TAKE 1 TABLET(1 MG) BY MOUTH DAILY 09/21/17   Gardenia Phlegm, NP  Biotin 2500 MCG CAPS Take 5,000 mcg by mouth daily. 03/23/17   [name], MD  buPROPion (WELLBUTRIN XL) 300 MG 24 hr tablet TAKE 1 TABLET(300 MG) BY MOUTH DAILY 11/23/17   [name], MD  buPROPion HCl ER, XL, 450 MG TB24 Take 450 mg by mouth daily. 09/23/17   Gardenia Phlegm, NP  CALCIUM PO Take by mouth.    [provider]  cetirizine (ZYRTEC) 5 MG tablet Take 1 tablet (5 mg total) by mouth daily. 10/15/15   [name], MD  Cholecalciferol (VITAMIN D3) 10000 UNITS capsule Take 10,000 Units by mouth daily. Only 5 days a week     [provider]  COLLAGEN PO Take by mouth daily.    [provider]  esomeprazole (NEXIUM) 40 MG capsule Take 1 capsule (40 mg total) by mouth daily at 12 noon. 06/26/16   [name], MD  fexofenadine-pseudoephedrine (ALLEGRA-D 24) 180-240 MG 24 hr tablet Take 1 tablet by mouth daily.    [provider]  levothyroxine (SYNTHROID, LEVOTHROID) 75 MCG tablet Take 75 mcg by mouth daily.      [provider]  Prasterone (INTRAROSA) 6.5 MG INST Place 1 application vaginally daily. 09/21/17   Gardenia Phlegm, NP  Turmeric (CURCUMIN 95 PO) Take 65 mg by mouth daily.    [provider]  ZOLMitriptan (ZOMIG) 2.5 MG tablet Take 2.5 mg by mouth as needed.      [provider]    Family History Family History  Problem Relation Age of Onset  . Alcohol abuse Maternal Aunt   . Prostate cancer Maternal Uncle 50  . Colon cancer Maternal Grandmother        dx in her 42s  . Prostate cancer Maternal Grandfather        dx in his 26s  . Cancer Other        FH of Breast Cancer, Ovarian/Uterine Cancer  . Heart  disease Maternal Uncle        14s  . Breast cancer Other        MGFs sister - reportedly BRCA+  . Ovarian cancer Other        PGFs mother  . Ovarian cancer Other        PGFs maternal aunt  . Ovarian cancer Other        PGFs maternal grandmohter  . Breast cancer Other  MGMs sister  . Breast cancer Other        MGFs mother    Social History Social History   Tobacco Use  . Smoking status: Never Smoker  . Smokeless tobacco: Never Used  Substance Use Topics  . Alcohol use: Yes    Comment: social  . Drug use: No     Allergies   Other; Macrodantin; Pertussis vaccines; and Tape   Review of Systems Review of Systems  Constitutional: Negative for diaphoresis and fever.  Respiratory: Positive for shortness of breath.   Cardiovascular: Positive for chest pain. Negative for palpitations, leg swelling and syncope.  Musculoskeletal: Positive for back pain and neck pain.  All other systems reviewed and are negative.    Physical Exam Updated Vital Signs BP 127/77   Pulse 100   Temp 98.4 F (36.9 C) (Oral)   Resp 20   Ht 5' 4.5" (1.638 m)   Wt 74.4 kg (164 lb)   LMP 02/29/2016   SpO2 100%   BMI 27.72 kg/m   Physical Exam  Constitutional: She is oriented to person, place, and time. She appears well-developed and well-nourished. No distress.  HENT:  Head: Normocephalic and atraumatic.  Mouth/Throat: Oropharynx is clear and moist. No oropharyngeal exudate.  Eyes: Pupils are equal, round, and reactive to light.  Neck: Normal range of motion. Neck supple.  Cardiovascular: Normal rate, regular rhythm, normal heart sounds and intact distal pulses.  Pulmonary/Chest: No stridor. No respiratory distress. She has no wheezes. She has no rales.  Diminished Left lateral base.  Scar tissue noted laterally and anteriorly ([place])  Abdominal: Soft. Bowel sounds are normal. She exhibits no mass. There is no tenderness. There is no rebound and no guarding.  Musculoskeletal: Normal  range of motion. She exhibits no edema or tenderness.  Neurological: She is alert and oriented to person, place, and time. She displays normal reflexes.  Skin: Skin is warm and dry. Capillary refill takes less than 2 seconds. She is not diaphoretic.  Psychiatric: Her mood appears anxious.     ED Treatments / Results  Labs (all labs ordered are listed, but only abnormal results are displayed) Results for orders placed or performed during the hospital encounter of 29/79/89  Basic metabolic panel  Result Value Ref Range   Sodium 141 135 - 145 mmol/L   Potassium 3.5 3.5 - 5.1 mmol/L   Chloride 106 101 - 111 mmol/L   CO2 24 22 - 32 mmol/L   Glucose, Bld 102 (H) 65 - 99 mg/dL   BUN 20 6 - 20 mg/dL   Creatinine, Ser 1.13 (H) 0.44 - 1.00 mg/dL   Calcium 8.8 (L) 8.9 - 10.3 mg/dL   GFR calc non Af Amer 58 (L) >60 mL/min   GFR calc Af Amer >60 >60 mL/min   Anion gap 11 5 - 15  CBC  Result Value Ref Range   WBC 7.3 4.0 - 10.5 K/uL   RBC 3.98 3.87 - 5.11 MIL/uL   Hemoglobin 12.6 12.0 - 15.0 g/dL   HCT 36.8 36.0 - 46.0 %   MCV 92.5 78.0 - 100.0 fL   MCH 31.7 26.0 - 34.0 pg   MCHC 34.2 30.0 - 36.0 g/dL   RDW 13.9 11.5 - 15.5 %   Platelets 379 150 - 400 K/uL  Troponin I  Result Value Ref Range   Troponin I <0.03 <0.03 ng/mL   Dg Chest 2 View  Result Date: 12/13/2017 CLINICAL DATA:  47 year old female with acute chest  pain. EXAM: CHEST - 2 VIEW COMPARISON:  03/17/2017 PET CT and prior studies FINDINGS: A moderate to large LEFT basilar pneumothorax is noted. No mediastinal shift. The cardiomediastinal silhouette is unremarkable. No pleural effusion, airspace disease or acute bony abnormality. Breast surgical changes identified. IMPRESSION: Moderate to large LEFT basilar pneumothorax.  No mediastinal shift. Critical Value/emergent results were called by telephone at the time of interpretation on 12/13/2017 at 12:05 am to Dr. Veatrice Kells , who verbally acknowledged these results. Electronically  Signed   By: Margarette Canada M.D.   On: 12/13/2017 00:05   Ct Angio Chest Pe W And/or Wo Contrast  Result Date: 12/13/2017 CLINICAL DATA:  Assess left-sided pneumothorax. Personal history of Hodgkin's lymphoma. Assess for pulmonary embolus. EXAM: CT ANGIOGRAPHY CHEST WITH CONTRAST TECHNIQUE: Multidetector CT imaging of the chest was performed using the standard protocol during bolus administration of intravenous contrast. Multiplanar CT image reconstructions and MIPs were obtained to evaluate the vascular anatomy. CONTRAST:  123m ISOVUE-370 IOPAMIDOL (ISOVUE-370) INJECTION 76% COMPARISON:  Chest radiograph performed 12/12/2017, and PET/CT performed 03/17/2017 FINDINGS: Cardiovascular:  There is no evidence of pulmonary embolus. The heart is normal in size. The thoracic aorta is unremarkable. The great vessels are within normal limits. Mediastinum/Nodes: The mediastinum is unremarkable in appearance. No mediastinal lymphadenopathy is seen. No pericardial effusion is identified. Postoperative change is noted along the anterior mediastinum. The patient is status post thyroidectomy. No axillary lymphadenopathy is seen. Scattered clips are seen at the axilla bilaterally. Lungs/Pleura: A large left-sided pneumothorax is noted. There is residual partial expansion of the left upper and lower lobes. Mild bilateral atelectasis is noted. No mass is seen. Upper Abdomen: The visualized portions of the liver and residual splenule are unremarkable in appearance. The visualized portions of the adrenal glands and kidneys are within normal limits. The pancreas is grossly unremarkable. Musculoskeletal: No acute osseous abnormalities are identified. The visualized musculature is unremarkable in appearance. Review of the MIP images confirms the above findings. IMPRESSION: 1. No evidence of pulmonary embolus. 2. Large left-sided pneumothorax noted, with residual partial expansion of the left upper and lower lung lobes. 3. Mild bilateral  atelectasis noted. Critical Value/emergent results were called by telephone at the time of interpretation on 12/13/2017 at 1:46 am to Dr. AVeatrice Kells who verbally acknowledged these results. Electronically Signed   By: JGarald BaldingM.D.   On: 12/13/2017 01:53    EKG EKG Interpretation  Date/Time:  Saturday December 12 2017 23:43:06 EDT Ventricular Rate:  89 PR Interval:  146 QRS Duration: 138 QT Interval:  420 QTC Calculation: 511 R Axis:   49 Text Interpretation:  Normal sinus rhythm Non-specific intra-ventricular conduction block Confirmed by PRandal Buba Celena Lanius ([id number]) on 12/13/2017 1:48:40 AM   Radiology Dg Chest 2 View  Result Date: 12/13/2017 CLINICAL DATA:  46year old female with acute chest pain. EXAM: CHEST - 2 VIEW COMPARISON:  03/17/2017 PET CT and prior studies FINDINGS: A moderate to large LEFT basilar pneumothorax is noted. No mediastinal shift. The cardiomediastinal silhouette is unremarkable. No pleural effusion, airspace disease or acute bony abnormality. Breast surgical changes identified. IMPRESSION: Moderate to large LEFT basilar pneumothorax.  No mediastinal shift. Critical Value/emergent results were called by telephone at the time of interpretation on 12/13/2017 at 12:05 am to Dr. AVeatrice Kells, who verbally acknowledged these results. Electronically Signed   By: JMargarette CanadaM.D.   On: 12/13/2017 00:05   Ct Angio Chest Pe W And/or Wo Contrast  Result Date: 12/13/2017 CLINICAL DATA:  Assess  left-sided pneumothorax. Personal history of Hodgkin's lymphoma. Assess for pulmonary embolus. EXAM: CT ANGIOGRAPHY CHEST WITH CONTRAST TECHNIQUE: Multidetector CT imaging of the chest was performed using the standard protocol during bolus administration of intravenous contrast. Multiplanar CT image reconstructions and MIPs were obtained to evaluate the vascular anatomy. CONTRAST:  123m ISOVUE-370 IOPAMIDOL (ISOVUE-370) INJECTION 76% COMPARISON:  Chest radiograph performed 12/12/2017, and  PET/CT performed 03/17/2017 FINDINGS: Cardiovascular:  There is no evidence of pulmonary embolus. The heart is normal in size. The thoracic aorta is unremarkable. The great vessels are within normal limits. Mediastinum/Nodes: The mediastinum is unremarkable in appearance. No mediastinal lymphadenopathy is seen. No pericardial effusion is identified. Postoperative change is noted along the anterior mediastinum. The patient is status post thyroidectomy. No axillary lymphadenopathy is seen. Scattered clips are seen at the axilla bilaterally. Lungs/Pleura: A large left-sided pneumothorax is noted. There is residual partial expansion of the left upper and lower lobes. Mild bilateral atelectasis is noted. No mass is seen. Upper Abdomen: The visualized portions of the liver and residual splenule are unremarkable in appearance. The visualized portions of the adrenal glands and kidneys are within normal limits. The pancreas is grossly unremarkable. Musculoskeletal: No acute osseous abnormalities are identified. The visualized musculature is unremarkable in appearance. Review of the MIP images confirms the above findings. IMPRESSION: 1. No evidence of pulmonary embolus. 2. Large left-sided pneumothorax noted, with residual partial expansion of the left upper and lower lung lobes. 3. Mild bilateral atelectasis noted. Critical Value/emergent results were called by telephone at the time of interpretation on 12/13/2017 at 1:46 am to Dr. AVeatrice Kells who verbally acknowledged these results. Electronically Signed   By: JGarald BaldingM.D.   On: 12/13/2017 01:53    Procedures Procedures (including critical care time)  Medications Ordered in ED Medications  0.9 %  sodium chloride infusion ( Intravenous New Bag/Given 12/13/17 0136)  fentaNYL (SUBLIMAZE) injection 25 mcg (25 mcg Intravenous Given 12/13/17 0045)  iopamidol (ISOVUE-370) 76 % injection 100 mL (100 mLs Intravenous Contrast Given 12/13/17 0116)     Case d/w Dr. ORoxy Manns of thoracic surgery, if stable admit to medicine and he will follow and repeat CXR in am at 6 am (PA/lateral) and if persistent will have IR placement of chest tube.  If improved may follow clinically    I have discussed the risks and benefits of all procedures with the patient and she is comfortable with this plan.  She is feeling improved on O2 and with pain medication.     Final Clinical Impressions(s) / ED Diagnoses   [facility]    Jailene Cupit, MD 12/13/17 0222

## 2017-12-14 ENCOUNTER — Inpatient Hospital Stay (HOSPITAL_COMMUNITY): Payer: BLUE CROSS/BLUE SHIELD

## 2017-12-14 MED ORDER — MORPHINE SULFATE (PF) 2 MG/ML IV SOLN
2.0000 mg | Freq: Once | INTRAVENOUS | Status: AC
  Filled 2017-12-14: qty 1

## 2017-12-14 MED ORDER — MORPHINE SULFATE (PF) 2 MG/ML IV SOLN
INTRAVENOUS | Status: AC
  Administered 2017-12-14: 2 mg via INTRAVENOUS
  Filled 2017-12-14: qty 1

## 2017-12-14 MED ORDER — CHLORHEXIDINE GLUCONATE CLOTH 2 % EX PADS
6.0000 | MEDICATED_PAD | Freq: Every day | CUTANEOUS | Status: DC
  Administered 2017-12-14 – 2017-12-18 (×2): 6 via TOPICAL

## 2017-12-14 MED ORDER — FLEET ENEMA 7-19 GM/118ML RE ENEM
1.0000 | ENEMA | Freq: Once | RECTAL | Status: AC
  Administered 2017-12-14: 1 via RECTAL
  Filled 2017-12-14: qty 1

## 2017-12-14 MED ORDER — MIDAZOLAM HCL 2 MG/2ML IJ SOLN
INTRAMUSCULAR | Status: AC
  Administered 2017-12-14: 2 mg via INTRAVENOUS
  Filled 2017-12-14: qty 2

## 2017-12-14 MED ORDER — LIDOCAINE HCL (PF) 1 % IJ SOLN
INTRAMUSCULAR | Status: AC
  Administered 2017-12-14: 13:00:00
  Filled 2017-12-14: qty 30

## 2017-12-14 MED ORDER — MUPIROCIN 2 % EX OINT
1.0000 "application " | TOPICAL_OINTMENT | Freq: Two times a day (BID) | CUTANEOUS | Status: DC
  Administered 2017-12-14 – 2017-12-18 (×8): 1 via NASAL
  Filled 2017-12-14 (×2): qty 22

## 2017-12-14 MED ORDER — MIDAZOLAM HCL 2 MG/2ML IJ SOLN
2.0000 mg | Freq: Once | INTRAMUSCULAR | Status: AC
  Filled 2017-12-14: qty 2

## 2017-12-14 NOTE — Progress Notes (Addendum)
      [id number]       [place] [address]             [phone]           Subjective: Patient with some pain on left side. She states her breathing is unchanged from yesterday.  Objective: Vital signs in last 24 hours: Temp:  [97.9 F (36.6 C)-98.2 F (36.8 C)] 98.2 F (36.8 C) (06/03 0407) Pulse Rate:  [62-91] 62 (06/03 0407) Cardiac Rhythm: Normal sinus rhythm;Bundle branch block (06/02 2320) Resp:  [16-24] 18 (06/03 0407) BP: (114-133)/(66-71) 129/71 (06/03 0407) SpO2:  [95 %-100 %] 98 % (06/03 0407) Weight:  [170 lb 3.1 oz (77.2 kg)] 170 lb 3.1 oz (77.2 kg) (06/03 0407)     Intake/Output from previous day: 06/02 0701 - 06/03 0700 In: 4434.6 [P.O.:720; I.V.:3714.6] Out: 0    Physical Exam:  Cardiovascular: RRR Pulmonary: Clear to auscultation on right and slightly diminished left base   Lab Results: CBC: Recent Labs    12/13/17 0031  WBC 7.3  HGB 12.6  HCT 36.8  PLT 379   BMET:  Recent Labs    12/13/17 0031  NA 141  K 3.5  CL 106  CO2 24  GLUCOSE 102*  BUN 20  CREATININE 1.13*  CALCIUM 8.8*    PT/INR:  Recent Labs    12/13/17 0726  LABPROT 14.0  INR 1.09   ABG:  INR: Will add last result for INR, ABG once components are confirmed Will add last 4 CBG results once components are confirmed  Assessment/Plan:  1. CV - SR in the 60's. 2.  Pulmonary - She was on 2 liters of oxygen via [place] but on room air this am. CXR this am shows a persistent left base pneumothorax, appears similar to previous chest x ray. Hopefully, she will not need chest tube. Await Dr. Guy Sandifer recommendation   Donielle M ZimmermanPA-C 12/14/2017,7:34 AM    I have seen and examined the patient and agree with the assessment and plan as outlined.  Pneumothorax appears stable but perhaps slightly larger and patient remains symptomatic, although minimal SOB.  Discussed options at length with patient and her husband at the bedside.  Will proceed with  chest tube placement.  Indications, risks and potential benefits discussed.  All questions answered.  I spent in excess of 15 minutes during the conduct of this hospital encounter and >50% of this time involved direct face-to-face encounter with the patient for counseling and/or coordination of their care.   Rexene Alberts, MD 12/14/2017 8:48 AM

## 2017-12-14 NOTE — Progress Notes (Signed)
Patient vomited x1 this shift, pain continues stated it started in the chest when sitting up and radiates to shoulders rate 3 of pain scale.  Pt. Refusing pain medication for fear it may be causing her nausea/vomiting.  Went to [employer] and returned without difficulty.

## 2017-12-14 NOTE — Op Note (Signed)
CARDIOTHORACIC SURGERY OPERATIVE NOTE  Date of Procedure:  12/14/2017  Preoperative Diagnosis: Left Spontaneous Pneumothorax  Postoperative Diagnosis: Same  Procedure:   Left chest tube placement  Surgeon:   Valentina Gu. Roxy Manns, MD  Anesthesia: 1% lidocaine local with intravenous sedation    DETAILS OF THE OPERATIVE PROCEDURE  Following full informed consent the patient was given midazolam 2 mg and morphine sulfate 2 mg intravenously and continuously monitored for rhythm, BP and oxygen saturation. The left chest was prepared and draped in a sterile manner. 1% lidocaine was utilized to anesthetize the skin and subcutaneous tissues. A small incision was made and a 28 French straight chest tube was placed through the incision into the pleural space. The tube was secured to the skin and connected to a closed suction collection device. The patient tolerated the procedure well. A portable CXR was ordered. There were no complications.    Valentina Gu. Roxy Manns, MD 12/14/2017 9:34 AM

## 2017-12-14 NOTE — Progress Notes (Signed)
PROGRESS NOTE    Stacy Chung  YNW:[id number] DOB: 12-08-1970 DOA: 12/12/2017 PCP: [name], MD   Brief [place] y.o. female with medical history significant of multiple malignancy (melanoma, breast cancer, Hodgkin disease, thyroid cancer), PE, GERD, hypothyroidism, depression, who presents with chest pain and SOB.  Pt states that she initially started having left neck and shoulder pain in am on Saturday while patient was in [place], then started having left lower chest pain.  The pain is constant, sharp, 8 out of 10 in severity, nonradiating.  It is associated with mild shortness of breath.  It is aggravated by laying flat and leaning forward.  No cough, fever or chills. Patient denies nausea, vomiting, diarrhea, abdominal pain, symptoms of UTI or unilateral weakness. Of note, pt has hx of at least 2 previous pneumothoraces, requiring thoracotomy.  ED Course: pt was found to have WBC 7.3, negative troponin, acute renal injury with creatinine 1.13, temperature normal, tachycardia, tachypnea, oxygen saturation 97% on room air, chest x-ray showed left large pneumothorax. CT angiogram is negative for PE, but showed large left pneumothorax.  Patient is admitted to stepdown as inpatient.  Thoracic surgery, Dr. Roxy Manns was consulted.    Assessment & Plan:   Principal Problem:   Pneumothorax on left Active Problems:   Breast cancer of lower-outer quadrant of right female breast ([place])   Hypothyroidism   GERD (gastroesophageal reflux disease)   Depression   AKI (acute kidney injury) ([place])  Pneumothorax on left: as evidenced by CTA and CX -status post left chest tube placement.  Daily chest x-rays.   Hx of  multiple malignancy (melanoma, breast cancer, Hodgkin disease, thyroid cancer): following up with Dr. Lindi Adie. Currently is on anastrozole for breast cancer -Continue anastrozole -follow-up with Dr. Lindi Adie as an outpatient.  Hypothyroidism: Last TSH was not on  record -Continue home Synthroid 175 MCG daily  Depression: Stable, no suicidal or homicidal ideations. -will hold Wellbutrin due QTc prolongation 511  GERD: -Protonix  AKI: Likely due to prerenal secondary to dehydration. - IVF: 125 cc/h NS - Follow up renal function by BMP      DVT prophylaxis SCD Code Status full code Family Communication husband by the bedside Disposition Plan TBD Consultants: CT surgery  Procedures: Status post left chest tube placement for left spontaneous pneumothorax 12/14/2017  Antimicrobials: None Subjective: Feels well I saw her prior to the surgery she appeared in no acute distress she was on room air but had left shoulder pain yesterday and worsening nausea and vomiting and headache.  At this morning she feels better than yesterday. Objective: Vitals:   12/14/17 0700 12/14/17 0926 12/14/17 0927 12/14/17 1100  BP: 132/77   (!) 114/49  Pulse: 90 87  99  Resp:      Temp: 98.5 F (36.9 C)   98.5 F (36.9 C)  TempSrc: Oral   Oral  SpO2: 98% (!) 88% 93% 100%  Weight:      Height:        Intake/Output Summary (Last 24 hours) at 12/14/2017 1159 Last data filed at 12/14/2017 0930 Gross per 24 hour  Intake 4194.58 ml  Output 0 ml  Net 4194.58 ml   Filed Weights   12/12/17 2334 12/13/17 0613 12/14/17 0407  Weight: 74.4 kg (164 lb) 79.8 kg (175 lb 14.8 oz) 77.2 kg (170 lb 3.1 oz)    Examination:  General exam: Appears calm and comfortable  Respiratory system: Clear to auscultation. Respiratory effort normal. Cardiovascular system: S1 & S2 heard,  RRR. No JVD, murmurs, rubs, gallops or clicks. No pedal edema. Gastrointestinal system: Abdomen is nondistended, soft and nontender. No organomegaly or masses felt. Normal bowel sounds heard. Central nervous system: Alert and oriented. No focal neurological deficits. Extremities: Symmetric 5 x 5 power. Skin: No rashes, lesions or ulcers Psychiatry: Judgement and insight appear normal. Mood & affect  appropriate.     Data Reviewed: I have personally reviewed following labs and imaging studies  CBC: Recent Labs  Lab 12/13/17 0031  WBC 7.3  HGB 12.6  HCT 36.8  MCV 92.5  PLT 762   Basic Metabolic Panel: Recent Labs  Lab 12/13/17 0031  NA 141  K 3.5  CL 106  CO2 24  GLUCOSE 102*  BUN 20  CREATININE 1.13*  CALCIUM 8.8*   GFR: Estimated Creatinine Clearance: 63.2 mL/min (A) (by C-G formula based on SCr of 1.13 mg/dL (H)). Liver Function Tests: No results for input(s): AST, ALT, ALKPHOS, BILITOT, PROT, ALBUMIN in the last 168 hours. No results for input(s): LIPASE, AMYLASE in the last 168 hours. No results for input(s): AMMONIA in the last 168 hours. Coagulation Profile: Recent Labs  Lab 12/13/17 0726  INR 1.09   Cardiac Enzymes: Recent Labs  Lab 12/13/17 0032  TROPONINI <0.03   BNP (last 3 results) No results for input(s): PROBNP in the last 8760 hours. HbA1C: No results for input(s): HGBA1C in the last 72 hours. CBG: No results for input(s): GLUCAP in the last 168 hours. Lipid Profile: No results for input(s): CHOL, HDL, LDLCALC, TRIG, CHOLHDL, LDLDIRECT in the last 72 hours. Thyroid Function Tests: No results for input(s): TSH, T4TOTAL, FREET4, T3FREE, THYROIDAB in the last 72 hours. Anemia Panel: No results for input(s): VITAMINB12, FOLATE, FERRITIN, TIBC, IRON, RETICCTPCT in the last 72 hours. Sepsis Labs: No results for input(s): PROCALCITON, LATICACIDVEN in the last 168 hours.  Recent Results (from the past 240 hour(s))  MRSA PCR Screening     Status: Abnormal   Collection Time: 12/13/17  6:09 AM  Result Value Ref Range Status   MRSA by PCR POSITIVE (A) NEGATIVE Final    Comment:        The GeneXpert MRSA Assay (FDA approved for NASAL specimens only), is one component of a comprehensive MRSA colonization surveillance program. It is not intended to diagnose MRSA infection nor to guide or monitor treatment for MRSA infections. RESULT  CALLED TO, READ BACK BY AND VERIFIED WITH: B.RONCALLO RN AT 0845 12/13/17 BY A.DAVIS Performed at [facility], [place] [address] [place], [place] [address]          Radiology Studies: Dg Chest 2 View  Result Date: 12/14/2017 CLINICAL DATA:  Pneumothorax EXAM: CHEST - 2 VIEW COMPARISON:  December 13, 2017. FINDINGS: There is a persistent pneumothorax in the left base region without tension component which is stable. There is small associated pleural effusion on the left. There is are areas of postoperative change and atelectasis on the left, stable. No new opacity on either side. No consolidation. There is apical scarring bilaterally. Heart size and pulmonary vascularity are normal. Postoperative changes are noted at the cervical-thoracic junction as well as surgical clips in each axillary region. IMPRESSION: Stable left base pneumothorax with small associated pleural effusion. No tension component. Areas of atelectatic change and scarring remains stable. No new opacity. Heart size normal. Electronically Signed   By: Lowella Grip III M.D.   On: 12/14/2017 07:27   Dg Chest 2 View  Result Date: 12/13/2017 CLINICAL DATA:  Pneumothorax. EXAM: CHEST - 2 VIEW COMPARISON:  Chest x-ray dated 12/12/2017. Chest CT angiogram dated 12/13/2017. FINDINGS: Persistent pneumothorax at the LEFT lung base, with overlying airspace collapse. RIGHT lung is clear. Heart size and mediastinal contours are within normal limits. Surgical clips overlying the LEFT chest wall. No acute or suspicious osseous finding. IMPRESSION: 1. Stable appearance of the pneumothorax at the LEFT lung base, with associated overlying airspace collapse. 2. No new lung findings. Electronically Signed   By: Franki Cabot M.D.   On: 12/13/2017 08:57   Dg Chest 2 View  Result Date: 12/13/2017 CLINICAL DATA:  47 year old female with acute chest pain. EXAM: CHEST - 2 VIEW COMPARISON:  03/17/2017 PET CT and prior studies FINDINGS: A moderate to large  LEFT basilar pneumothorax is noted. No mediastinal shift. The cardiomediastinal silhouette is unremarkable. No pleural effusion, airspace disease or acute bony abnormality. Breast surgical changes identified. IMPRESSION: Moderate to large LEFT basilar pneumothorax.  No mediastinal shift. Critical Value/emergent results were called by telephone at the time of interpretation on 12/13/2017 at 12:05 am to Dr. Veatrice Kells , who verbally acknowledged these results. Electronically Signed   By: Margarette Canada M.D.   On: 12/13/2017 00:05   Ct Angio Chest Pe W And/or Wo Contrast  Result Date: 12/13/2017 CLINICAL DATA:  Assess left-sided pneumothorax. Personal history of Hodgkin's lymphoma. Assess for pulmonary embolus. EXAM: CT ANGIOGRAPHY CHEST WITH CONTRAST TECHNIQUE: Multidetector CT imaging of the chest was performed using the standard protocol during bolus administration of intravenous contrast. Multiplanar CT image reconstructions and MIPs were obtained to evaluate the vascular anatomy. CONTRAST:  168mL ISOVUE-370 IOPAMIDOL (ISOVUE-370) INJECTION 76% COMPARISON:  Chest radiograph performed 12/12/2017, and PET/CT performed 03/17/2017 FINDINGS: Cardiovascular:  There is no evidence of pulmonary embolus. The heart is normal in size. The thoracic aorta is unremarkable. The great vessels are within normal limits. Mediastinum/Nodes: The mediastinum is unremarkable in appearance. No mediastinal lymphadenopathy is seen. No pericardial effusion is identified. Postoperative change is noted along the anterior mediastinum. The patient is status post thyroidectomy. No axillary lymphadenopathy is seen. Scattered clips are seen at the axilla bilaterally. Lungs/Pleura: A large left-sided pneumothorax is noted. There is residual partial expansion of the left upper and lower lobes. Mild bilateral atelectasis is noted. No mass is seen. Upper Abdomen: The visualized portions of the liver and residual splenule are unremarkable in  appearance. The visualized portions of the adrenal glands and kidneys are within normal limits. The pancreas is grossly unremarkable. Musculoskeletal: No acute osseous abnormalities are identified. The visualized musculature is unremarkable in appearance. Review of the MIP images confirms the above findings. IMPRESSION: 1. No evidence of pulmonary embolus. 2. Large left-sided pneumothorax noted, with residual partial expansion of the left upper and lower lung lobes. 3. Mild bilateral atelectasis noted. Critical Value/emergent results were called by telephone at the time of interpretation on 12/13/2017 at 1:46 am to Dr. Veatrice Kells, who verbally acknowledged these results. Electronically Signed   By: Garald Balding M.D.   On: 12/13/2017 01:53   Dg Chest Port 1 View  Result Date: 12/14/2017 CLINICAL DATA:  Chest tube placement.  Follow up pneumothorax. EXAM: PORTABLE CHEST 1 VIEW COMPARISON:  Earlier same day. FINDINGS: 0944 hours. Interval chest tube placement inferiorly in the left hemithorax. The side hole of the chest tube is external to the pleural space. No residual basilar pneumothorax identified. There are lower lung volumes with mildly increased bibasilar atelectasis. There is scarring in both apices. The heart size and mediastinal  contours are stable. Surgical clips are present in the lower neck, both breasts and the upper abdomen. IMPRESSION: Successful evacuation of left basilar pneumothorax following chest tube placement. Side hole of the chest tube is external to the pleural space. Electronically Signed   By: Richardean Sale M.D.   On: 12/14/2017 10:00        Scheduled Meds: . anastrozole  1 mg Oral Daily  . calcium carbonate  1 tablet Oral Daily  . Chlorhexidine Gluconate Cloth  6 each Topical Q0600  . enoxaparin (LOVENOX) injection  40 mg Subcutaneous Q24H  . levothyroxine  175 mcg Oral QAC breakfast  . lidocaine (PF)      . loratadine  10 mg Oral Daily  . mupirocin ointment  1  application Nasal BID  . ondansetron (ZOFRAN) IV  4 mg Intravenous Q6H  . pantoprazole  40 mg Oral Daily  . scopolamine  1 patch Transdermal Q72H  . [START ON 12/16/2017] Vitamin D (Ergocalciferol)  50,000 Units Oral Q Wed   Continuous Infusions: . sodium chloride 125 mL/hr at 12/14/17 0340     LOS: 1 day     Georgette Shell, MD [facility]  If 7PM-7AM, please contact night-coverage [email or web address] Password [email or web address] 12/14/2017, 11:59 AM

## 2017-12-14 NOTE — Progress Notes (Signed)
Chest tube placed with Dr Roxy Manns at bedside, please see his note.

## 2017-12-14 NOTE — Progress Notes (Signed)
Pt refused morning labs. MD aware.

## 2017-12-15 ENCOUNTER — Inpatient Hospital Stay (HOSPITAL_COMMUNITY): Payer: BLUE CROSS/BLUE SHIELD

## 2017-12-15 DIAGNOSIS — J9311 Primary spontaneous pneumothorax: Secondary | ICD-10-CM

## 2017-12-15 NOTE — Progress Notes (Signed)
PROGRESS NOTE    Stacy Chung  OYD:[id number] DOB: December 15, 1970 DOA: 12/12/2017 PCP: [name], MD  Brief [phone] y.o.femalewith medical history significant ofmultiple malignancy (melanoma, breast cancer, Hodgkin disease, thyroid cancer), PE, GERD, hypothyroidism, depression, who presents withchest pain and SOB.  Pt states that she initially started havingleft neck and shoulder paininam on Saturday while patient was in [place], then started havingleft lower chest pain.The pain is constant, sharp, 8 out of 10 in severity, nonradiating. It is associated with mild shortness of breath. It is aggravated by laying flat and leaning forward. No cough, fever or chills. Patient denies nausea, vomiting, diarrhea, abdominal pain, symptoms of UTI or unilateral weakness.Of note, pt has hx ofat least 2 previous pneumothoraces, requiring thoracotomy.  ED Course:pt was found to haveWBC 7.3, negative troponin, acute renal injury with creatinine 1.13, temperature normal, tachycardia, tachypnea, oxygen saturation 97% on room air, chest x-ray showed left large pneumothorax. CT angiogram is negative for PE, but showed large left pneumothorax.Patient is admitted to stepdown as inpatient. Thoracic surgery, Dr. Herbie Saxon consulted.      Assessment & Plan:   Principal Problem:   Pneumothorax on left Active Problems:   Breast cancer of lower-outer quadrant of right female breast ([facility])   Hypothyroidism   GERD (gastroesophageal reflux disease)   Depression   AKI (acute kidney injury) ([place])  Pneumothorax on left:as evidenced by CTA and CX-status post left chest tube placement.  Daily chest x-rays.   Hx ofmultiple malignancy (melanoma, breast cancer, Hodgkin disease, thyroid cancer):following up with Dr. Lindi Adie. Currently is on anastrozole for breast cancer -Continue anastrozole -follow-up with Dr. Lindi Adie as an outpatient.  Hypothyroidism: -Continue home Synthroid 175  MCG daily  Depression:Stable, no suicidal or homicidal ideations. -will holdWellbutrindue QTc prolongation 511  GERD: -Protonix  AKI: Patient refusing labs but she was on normal saline which will be stopped today.  She is eating and drinking well.       DVT prophylaxis: SCD Code Status: Full code Family Communication: Husband with the bedside Disposition Plan: TBD Consultants:  CT surgery  Procedures: Left chest tube placement for left spontaneous pneumothorax 12/14/2017 Antimicrobials: None Subjective: Complaining of more pain on the shoulder and chest tube site.  Requesting labs to be stopped unless it is necessary.  She is requesting all her medications to be changed to evening dose.   Objective: Vitals:   12/14/17 1947 12/14/17 2331 12/15/17 0346 12/15/17 0831  BP: 125/63 133/62 125/64 128/69  Pulse: 95 95 82 78  Resp: 20 17 18    Temp: 98.6 F (37 C) 98.6 F (37 C) 98.6 F (37 C) 98.1 F (36.7 C)  TempSrc: Oral Oral Oral Oral  SpO2: 100% 97% 100% 98%  Weight:      Height:        Intake/Output Summary (Last 24 hours) at 12/15/2017 1114 Last data filed at 12/15/2017 0900 Gross per 24 hour  Intake 1646.67 ml  Output 85 ml  Net 1561.67 ml   Filed Weights   12/12/17 2334 12/13/17 0613 12/14/17 0407  Weight: 74.4 kg (164 lb) 79.8 kg (175 lb 14.8 oz) 77.2 kg (170 lb 3.1 oz)    Examination:  General exam: Appears calm and comfortable  Respiratory system: Clear to auscultation. Respiratory effort normal. Cardiovascular system: S1 & S2 heard, RRR. No JVD, murmurs, rubs, gallops or clicks. No pedal edema. Gastrointestinal system: Abdomen is nondistended, soft and nontender. No organomegaly or masses felt. Normal bowel sounds heard. Central nervous system: Alert and oriented. No  focal neurological deficits. Extremities: Symmetric 5 x 5 power. Skin: No rashes, lesions or ulcers Psychiatry: Judgement and insight appear normal. Mood & affect appropriate.      Data Reviewed: I have personally reviewed following labs and imaging studies  CBC: Recent Labs  Lab 12/13/17 0031  WBC 7.3  HGB 12.6  HCT 36.8  MCV 92.5  PLT 854   Basic Metabolic Panel: Recent Labs  Lab 12/13/17 0031  NA 141  K 3.5  CL 106  CO2 24  GLUCOSE 102*  BUN 20  CREATININE 1.13*  CALCIUM 8.8*   GFR: Estimated Creatinine Clearance: 63.2 mL/min (A) (by C-G formula based on SCr of 1.13 mg/dL (H)). Liver Function Tests: No results for input(s): AST, ALT, ALKPHOS, BILITOT, PROT, ALBUMIN in the last 168 hours. No results for input(s): LIPASE, AMYLASE in the last 168 hours. No results for input(s): AMMONIA in the last 168 hours. Coagulation Profile: Recent Labs  Lab 12/13/17 0726  INR 1.09   Cardiac Enzymes: Recent Labs  Lab 12/13/17 0032  TROPONINI <0.03   BNP (last 3 results) No results for input(s): PROBNP in the last 8760 hours. HbA1C: No results for input(s): HGBA1C in the last 72 hours. CBG: No results for input(s): GLUCAP in the last 168 hours. Lipid Profile: No results for input(s): CHOL, HDL, LDLCALC, TRIG, CHOLHDL, LDLDIRECT in the last 72 hours. Thyroid Function Tests: No results for input(s): TSH, T4TOTAL, FREET4, T3FREE, THYROIDAB in the last 72 hours. Anemia Panel: No results for input(s): VITAMINB12, FOLATE, FERRITIN, TIBC, IRON, RETICCTPCT in the last 72 hours. Sepsis Labs: No results for input(s): PROCALCITON, LATICACIDVEN in the last 168 hours.  Recent Results (from the past 240 hour(s))  MRSA PCR Screening     Status: Abnormal   Collection Time: 12/13/17  6:09 AM  Result Value Ref Range Status   MRSA by PCR POSITIVE (A) NEGATIVE Final    Comment:        The GeneXpert MRSA Assay (FDA approved for NASAL specimens only), is one component of a comprehensive MRSA colonization surveillance program. It is not intended to diagnose MRSA infection nor to guide or monitor treatment for MRSA infections. RESULT CALLED TO, READ  BACK BY AND VERIFIED WITH: B.RONCALLO RN AT 0845 12/13/17 BY A.DAVIS Performed at [facility], [place] [address] [place],  [address]          Radiology Studies: Dg Chest 2 View  Result Date: 12/14/2017 CLINICAL DATA:  Pneumothorax EXAM: CHEST - 2 VIEW COMPARISON:  December 13, 2017. FINDINGS: There is a persistent pneumothorax in the left base region without tension component which is stable. There is small associated pleural effusion on the left. There is are areas of postoperative change and atelectasis on the left, stable. No new opacity on either side. No consolidation. There is apical scarring bilaterally. Heart size and pulmonary vascularity are normal. Postoperative changes are noted at the cervical-thoracic junction as well as surgical clips in each axillary region. IMPRESSION: Stable left base pneumothorax with small associated pleural effusion. No tension component. Areas of atelectatic change and scarring remains stable. No new opacity. Heart size normal. Electronically Signed   By: Lowella Grip III M.D.   On: 12/14/2017 07:27   Dg Chest Port 1 View  Result Date: 12/15/2017 CLINICAL DATA:  47 year old female with left-sided pneumothorax. Subsequent encounter. EXAM: PORTABLE CHEST 1 VIEW COMPARISON:  12/14/2017 chest x-ray. FINDINGS: Left chest tube in place. Side hole remains outside the chest cavity. Questionable tiny left  apical pneumothorax. Blunting left costophrenic angle may represent small amount of pleural fluid. Minimal left base atelectasis. Scarring lung apices. Mild calcification aortic knob. IMPRESSION: Left chest tube in place. Side hole remains outside the chest cavity. Questionable tiny left apical pneumothorax. Blunting left costophrenic angle may represent small amount of pleural fluid. Minimal left base atelectasis. Aortic Atherosclerosis (ICD10-I70.0). Electronically Signed   By: Genia Del M.D.   On: 12/15/2017 08:24   Dg Chest Port 1 View  Result Date:  12/14/2017 CLINICAL DATA:  Chest tube placement.  Follow up pneumothorax. EXAM: PORTABLE CHEST 1 VIEW COMPARISON:  Earlier same day. FINDINGS: 0944 hours. Interval chest tube placement inferiorly in the left hemithorax. The side hole of the chest tube is external to the pleural space. No residual basilar pneumothorax identified. There are lower lung volumes with mildly increased bibasilar atelectasis. There is scarring in both apices. The heart size and mediastinal contours are stable. Surgical clips are present in the lower neck, both breasts and the upper abdomen. IMPRESSION: Successful evacuation of left basilar pneumothorax following chest tube placement. Side hole of the chest tube is external to the pleural space. Electronically Signed   By: Richardean Sale M.D.   On: 12/14/2017 10:00        Scheduled Meds: . anastrozole  1 mg Oral Daily  . calcium carbonate  1 tablet Oral Daily  . Chlorhexidine Gluconate Cloth  6 each Topical Q0600  . enoxaparin (LOVENOX) injection  40 mg Subcutaneous Q24H  . levothyroxine  175 mcg Oral QAC breakfast  . loratadine  10 mg Oral Daily  . mupirocin ointment  1 application Nasal BID  . ondansetron (ZOFRAN) IV  4 mg Intravenous Q6H  . pantoprazole  40 mg Oral Daily  . scopolamine  1 patch Transdermal Q72H  . [START ON 12/16/2017] Vitamin D (Ergocalciferol)  50,000 Units Oral Q Wed   Continuous Infusions:   LOS: 2 days     Georgette Shell, MD [facility] If 7PM-7AM, please contact night-coverage [email or web address] Password [email or web address] 12/15/2017, 11:14 AM

## 2017-12-15 NOTE — Progress Notes (Addendum)
      [address]Suite [address]       [place] [address]             [phone]           Subjective: Patient has some pain up in left shoulder this am and at chest tube site.  Objective: Vital signs in last 24 hours: Temp:  [98.5 F (36.9 C)-98.7 F (37.1 C)] 98.6 F (37 C) (06/04 0346) Pulse Rate:  [79-99] 82 (06/04 0346) Cardiac Rhythm: Normal sinus rhythm (06/04 0702) Resp:  [17-20] 18 (06/04 0346) BP: (114-133)/(49-69) 125/64 (06/04 0346) SpO2:  [88 %-100 %] 100 % (06/04 0346)     Intake/Output from previous day: 06/03 0701 - 06/04 0700 In: 1646.7 [P.O.:480; I.V.:1166.7] Out: 50 [Chest Tube:50]   Physical Exam:  Cardiovascular: RRR Pulmonary: Clear to auscultation on right and slightly diminished left base Chest tube: to suction, no air leak  Lab Results: CBC: Recent Labs    12/13/17 0031  WBC 7.3  HGB 12.6  HCT 36.8  PLT 379   BMET:  Recent Labs    12/13/17 0031  NA 141  K 3.5  CL 106  CO2 24  GLUCOSE 102*  BUN 20  CREATININE 1.13*  CALCIUM 8.8*    PT/INR:  Recent Labs    12/13/17 0726  LABPROT 14.0  INR 1.09   ABG:  INR: Will add last result for INR, ABG once components are confirmed Will add last 4 CBG results once components are confirmed  Assessment/Plan:  1. CV - SR in the 60's. 2.  Pulmonary -  On room air.Chest tube with 50 of output since placement. Chest tube is to suction and there is no air leak.  No CXR ordered so will order. As discussed with Dr. Roxy Manns, will place chest tube to water seal.   Donielle M ZimmermanPA-C 12/15/2017,7:22 AM   I have seen and examined the patient and agree with the assessment and plan as outlined.  Rexene Alberts, MD 12/15/2017 8:12 AM

## 2017-12-16 ENCOUNTER — Inpatient Hospital Stay (HOSPITAL_COMMUNITY): Payer: BLUE CROSS/BLUE SHIELD

## 2017-12-16 DIAGNOSIS — Z9689 Presence of other specified functional implants: Secondary | ICD-10-CM

## 2017-12-16 DIAGNOSIS — J939 Pneumothorax, unspecified: Secondary | ICD-10-CM

## 2017-12-16 MED ORDER — KETOROLAC TROMETHAMINE 30 MG/ML IJ SOLN
15.0000 mg | Freq: Four times a day (QID) | INTRAMUSCULAR | Status: AC
  Administered 2017-12-16 (×2): 15 mg via INTRAVENOUS
  Filled 2017-12-16 (×2): qty 1

## 2017-12-16 NOTE — Progress Notes (Addendum)
      [address]       [place] [address]             [phone]           Subjective: Patient having some increased pain at chest tube site this am. She is requesting more Fentanyl.  Objective: Vital signs in last 24 hours: Temp:  [97.8 F (36.6 C)-98.9 F (37.2 C)] 97.8 F (36.6 C) (06/05 0728) Pulse Rate:  [72-88] 79 (06/05 0728) Cardiac Rhythm: Normal sinus rhythm (06/05 0701) Resp:  [20] 20 (06/05 0349) BP: (114-128)/(61-71) 119/68 (06/05 0728) SpO2:  [97 %-100 %] 100 % (06/05 0728) Weight:  [167 lb 8 oz (76 kg)] 167 lb 8 oz (76 kg) (06/05 0349)     Intake/Output from previous day: 06/04 0701 - 06/05 0700 In: 640 [P.O.:640] Out: 55 [Chest Tube:55]   Physical Exam:  Cardiovascular: RRR Pulmonary: Mostly clear to auscultation Chest tube: to water seal, no air leak  Lab Results: CBC: No results for input(s): WBC, HGB, HCT, PLT in the last 72 hours. BMET:  No results for input(s): NA, K, CL, CO2, GLUCOSE, BUN, CREATININE, CALCIUM in the last 72 hours.  PT/INR:  No results for input(s): LABPROT, INR in the last 72 hours. ABG:  INR: Will add last result for INR, ABG once components are confirmed Will add last 4 CBG results once components are confirmed  Assessment/Plan:  1. CV - SR in the 60's. 2.  Pulmonary -  On room air.Chest tube with 55 cc last 24 hours. Chest tube is to water seal and there is no air leak.  CXR this am appears stable. Hope to remove chest tube in am, as discussed with Dr. Roxy Manns. Check CXR in am. 3. Will give dose of Toradol to help with pain this am.   Donielle M ZimmermanPA-C 12/16/2017,7:52 AM    I have seen and examined the patient and agree with the assessment and plan as outlined.  Tentatively plan d/c tube tomorrow if CXR stable.  Rexene Alberts, MD 12/16/2017 8:26 AM

## 2017-12-16 NOTE — Progress Notes (Signed)
PROGRESS NOTE    Stacy Chung  XBJ:[id number] DOB: 1970-08-07 DOA: 12/12/2017 PCP: [name], MD    Brief Narrative:  47 y.o.femalewith medical history significant ofmultiple malignancy (melanoma, breast cancer, Hodgkin disease, thyroid cancer), PE, GERD, hypothyroidism, depression, who presents withchest pain and SOB.  Pt states that she initially started havingleft neck and shoulder paininam on Saturday while patient was in [place], then started havingleft lower chest pain.The pain is constant, sharp, 8 out of 10 in severity, nonradiating. It is associated with mild shortness of breath. It is aggravated by laying flat and leaning forward. No cough, fever or chills. Patient denies nausea, vomiting, diarrhea, abdominal pain, symptoms of UTI or unilateral weakness.Of note, pt has hx ofat least 2 previous pneumothoraces, requiring thoracotomy.  ED Course:pt was found to haveWBC 7.3, negative troponin, acute renal injury with creatinine 1.13, temperature normal, tachycardia, tachypnea, oxygen saturation 97% on room air, chest x-ray showed left large pneumothorax. CT angiogram is negative for PE, but showed large left pneumothorax.Patient is admitted to stepdown as inpatient. Thoracic surgery, Dr. Herbie Saxon consulted.  Assessment & Plan:   Principal Problem:   Pneumothorax on left Active Problems:   Breast cancer of lower-outer quadrant of right female breast (HCC)   Hypothyroidism   GERD (gastroesophageal reflux disease)   Depression   AKI (acute kidney injury) ([place])  Pneumothorax on left:Noted CTA and CX- status post left chest tube placement. Continue daily chest x-rays.CTS tentatively planning tube removal tomorrow  Hx ofmultiple malignancy (melanoma, breast cancer, Hodgkin disease, thyroid cancer):following up with Dr. Lindi Adie. Currently is on anastrozole for breast cancer -Continue anastrozole -recommend follow-up with Dr. Gardiner Fanti an  outpatient.  Hypothyroidism: -Continue home Synthroid175 MCG daily as tolerated  Depression:Stable, no suicidal or homicidal ideations. -continuing to holdWellbutrindue QTc prolongation 511  GERD: -Protonix as tolerated  AKI: Will repeat bmet in AM. Patient noted to be difficult blood draw   DVT prophylaxis: Lovenox subQ Code Status: Full Family Communication: Pt in room, family at bedside Disposition Plan: Uncertain at this time  Consultants:   CT surgery  Procedures:   Chest tube placement  Antimicrobials: Anti-infectives (From admission, onward)   None       Subjective: Reports pain around chest tube site  Objective: Vitals:   12/15/17 1945 12/15/17 2348 12/16/17 0349 12/16/17 0728  BP: 115/66 119/71 114/65 119/68  Pulse: 77 79 72 79  Resp:  20 20   Temp: 98.2 F (36.8 C) 98.3 F (36.8 C) 97.9 F (36.6 C) 97.8 F (36.6 C)  TempSrc: Oral Oral Oral Oral  SpO2: 100% 98% 100% 100%  Weight:   76 kg (167 lb 8 oz)   Height:        Intake/Output Summary (Last 24 hours) at 12/16/2017 1013 Last data filed at 12/16/2017 0400 Gross per 24 hour  Intake 400 ml  Output 20 ml  Net 380 ml   Filed Weights   12/13/17 0613 12/14/17 0407 12/16/17 0349  Weight: 79.8 kg (175 lb 14.8 oz) 77.2 kg (170 lb 3.1 oz) 76 kg (167 lb 8 oz)    Examination:  General exam: Appears calm and comfortable  Respiratory system: Clear to auscultation. Respiratory effort normal. Cardiovascular system: S1 & S2 heard, RRR. Gastrointestinal system: Abdomen is nondistended, soft and nontender. No organomegaly or masses felt. Normal bowel sounds heard. Central nervous system: Alert and oriented. No focal neurological deficits. Extremities: Symmetric 5 x 5 power. Skin: No rashes, lesions Psychiatry: Judgement and insight appear normal. Mood & affect  appropriate.   Data Reviewed: I have personally reviewed following labs and imaging studies  CBC: Recent Labs  Lab 12/13/17 0031    WBC 7.3  HGB 12.6  HCT 36.8  MCV 92.5  PLT 256   Basic Metabolic Panel: Recent Labs  Lab 12/13/17 0031  NA 141  K 3.5  CL 106  CO2 24  GLUCOSE 102*  BUN 20  CREATININE 1.13*  CALCIUM 8.8*   GFR: Estimated Creatinine Clearance: 62.7 mL/min (A) (by C-G formula based on SCr of 1.13 mg/dL (H)). Liver Function Tests: No results for input(s): AST, ALT, ALKPHOS, BILITOT, PROT, ALBUMIN in the last 168 hours. No results for input(s): LIPASE, AMYLASE in the last 168 hours. No results for input(s): AMMONIA in the last 168 hours. Coagulation Profile: Recent Labs  Lab 12/13/17 0726  INR 1.09   Cardiac Enzymes: Recent Labs  Lab 12/13/17 0032  TROPONINI <0.03   BNP (last 3 results) No results for input(s): PROBNP in the last 8760 hours. HbA1C: No results for input(s): HGBA1C in the last 72 hours. CBG: No results for input(s): GLUCAP in the last 168 hours. Lipid Profile: No results for input(s): CHOL, HDL, LDLCALC, TRIG, CHOLHDL, LDLDIRECT in the last 72 hours. Thyroid Function Tests: No results for input(s): TSH, T4TOTAL, FREET4, T3FREE, THYROIDAB in the last 72 hours. Anemia Panel: No results for input(s): VITAMINB12, FOLATE, FERRITIN, TIBC, IRON, RETICCTPCT in the last 72 hours. Sepsis Labs: No results for input(s): PROCALCITON, LATICACIDVEN in the last 168 hours.  Recent Results (from the past 240 hour(s))  MRSA PCR Screening     Status: Abnormal   Collection Time: 12/13/17  6:09 AM  Result Value Ref Range Status   MRSA by PCR POSITIVE (A) NEGATIVE Final    Comment:        The GeneXpert MRSA Assay (FDA approved for NASAL specimens only), is one component of a comprehensive MRSA colonization surveillance program. It is not intended to diagnose MRSA infection nor to guide or monitor treatment for MRSA infections. RESULT CALLED TO, READ BACK BY AND VERIFIED WITH: B.RONCALLO RN AT 0845 12/13/17 BY A.DAVIS Performed at [facility], [place] [address]  [place], [place] [address]      Radiology Studies: Dg Chest Port 1 View  Result Date: 12/16/2017 CLINICAL DATA:  Follow-up left-sided pneumothorax with chest tube treatment. EXAM: PORTABLE CHEST 1 VIEW COMPARISON:  Portable chest x-ray of December 15, 2017 FINDINGS: A faint pleural line is visible in the left apex similar to that seen yesterday. The left chest tube tip projects over the posterior aspect of the eighth rib with the proximal side hole lying outside the thoracic cavity. There is a small amount of pleural fluid on the left. There is minimal left basilar atelectasis. The heart and pulmonary vascularity are normal. There is calcification in the wall of the aortic arch. IMPRESSION: Less than 5% left apical pneumothorax. Stable appearing left chest tube with the proximal side hole lying outside the thoracic cavity. Minimal left basilar atelectasis and small left pleural effusion. Overall there has not been significant change since yesterday's study. Electronically Signed   By: David  Martinique M.D.   On: 12/16/2017 09:46   Dg Chest Port 1 View  Result Date: 12/15/2017 CLINICAL DATA:  47 year old female with left-sided pneumothorax. Subsequent encounter. EXAM: PORTABLE CHEST 1 VIEW COMPARISON:  12/14/2017 chest x-ray. FINDINGS: Left chest tube in place. Side hole remains outside the chest cavity. Questionable tiny left apical pneumothorax. Blunting left costophrenic angle may  represent small amount of pleural fluid. Minimal left base atelectasis. Scarring lung apices. Mild calcification aortic knob. IMPRESSION: Left chest tube in place. Side hole remains outside the chest cavity. Questionable tiny left apical pneumothorax. Blunting left costophrenic angle may represent small amount of pleural fluid. Minimal left base atelectasis. Aortic Atherosclerosis (ICD10-I70.0). Electronically Signed   By: Genia Del M.D.   On: 12/15/2017 08:24    Scheduled Meds: . anastrozole  1 mg Oral Daily  . calcium carbonate  1  tablet Oral Daily  . Chlorhexidine Gluconate Cloth  6 each Topical Q0600  . enoxaparin (LOVENOX) injection  40 mg Subcutaneous Q24H  . ketorolac  15 mg Intravenous Q6H  . levothyroxine  175 mcg Oral QAC breakfast  . loratadine  10 mg Oral Daily  . mupirocin ointment  1 application Nasal BID  . ondansetron (ZOFRAN) IV  4 mg Intravenous Q6H  . pantoprazole  40 mg Oral Daily  . scopolamine  1 patch Transdermal Q72H  . Vitamin D (Ergocalciferol)  50,000 Units Oral Q Wed   Continuous Infusions:   LOS: 3 days   Marylu Lund, MD [facility] Pager [phone]  If 7PM-7AM, please contact night-coverage [email or web address] Password [email or web address] 12/16/2017, 10:13 AM

## 2017-12-17 ENCOUNTER — Inpatient Hospital Stay (HOSPITAL_COMMUNITY): Payer: BLUE CROSS/BLUE SHIELD

## 2017-12-17 LAB — BASIC METABOLIC PANEL
Anion gap: 9 (ref 5–15)
BUN: 23 mg/dL — AB (ref 6–20)
CO2: 24 mmol/L (ref 22–32)
CREATININE: 0.96 mg/dL (ref 0.44–1.00)
Calcium: 9 mg/dL (ref 8.9–10.3)
Chloride: 106 mmol/L (ref 101–111)
GFR calc Af Amer: >60 mL/min (ref >60)
GFR calc non Af Amer: >60 mL/min (ref >60)
GLUCOSE: 118 mg/dL — AB (ref 65–99)
POTASSIUM: 4 mmol/L (ref 3.5–5.1)
Sodium: 139 mmol/L (ref 135–145)

## 2017-12-17 MED ORDER — KETOROLAC TROMETHAMINE 30 MG/ML IJ SOLN
15.0000 mg | Freq: Once | INTRAMUSCULAR | Status: AC
  Administered 2017-12-17: 15 mg via INTRAVENOUS
  Filled 2017-12-17: qty 1

## 2017-12-17 NOTE — Progress Notes (Signed)
PROGRESS NOTE    Stacy Chung  JXB:[id number] DOB: 1970-12-18 DOA: 12/12/2017 PCP: [name], MD    Brief Narrative:  47 y.o.femalewith medical history significant ofmultiple malignancy (melanoma, breast cancer, Hodgkin disease, thyroid cancer), PE, GERD, hypothyroidism, depression, who presents withchest pain and SOB.  Pt states that she initially started havingleft neck and shoulder paininam on Saturday while patient was in [place], then started havingleft lower chest pain.The pain is constant, sharp, 8 out of 10 in severity, nonradiating. It is associated with mild shortness of breath. It is aggravated by laying flat and leaning forward. No cough, fever or chills. Patient denies nausea, vomiting, diarrhea, abdominal pain, symptoms of UTI or unilateral weakness.Of note, pt has hx ofat least 2 previous pneumothoraces, requiring thoracotomy.  ED Course:pt was found to haveWBC 7.3, negative troponin, acute renal injury with creatinine 1.13, temperature normal, tachycardia, tachypnea, oxygen saturation 97% on room air, chest x-ray showed left large pneumothorax. CT angiogram is negative for PE, but showed large left pneumothorax.Patient is admitted to stepdown as inpatient. Thoracic surgery, Dr. Herbie Saxon consulted.  Assessment & Plan:   Principal Problem:   Pneumothorax on left Active Problems:   Breast cancer of lower-outer quadrant of right female breast (HCC)   Hypothyroidism   GERD (gastroesophageal reflux disease)   Depression   AKI (acute kidney injury) ([place])  Pneumothorax on left:Noted CTA and CX- status post left chest tube placement.  CT surgery following.  Minimal output noted.  Chest tube has since been discontinued as of 12/17/2017.  Hx ofmultiple malignancy (melanoma, breast cancer, Hodgkin disease, thyroid cancer):following up with Dr. Lindi Adie. Currently is on anastrozole for breast cancer -Continue anastrozole as tolerated -recommend  follow-up with Dr. Gardiner Fanti an outpatient.  Hypothyroidism: -Continue home Synthroid175 MCG daily she tolerates  Depression:Stable, no suicidal or homicidal ideations. -Wellbutrin have been placed on holddue QTc prolongation 511  GERD: -Continue with Protonix as tolerated  AKI:  -Resolved -Repeat basic metabolic panel reviewed, creatinine normalized  DVT prophylaxis: Lovenox subQ Code Status: Full Family Communication: Pt in room, family at bedside Disposition Plan: Possible discharge home in the next 24 to 48 hours when cleared by CT surgery  Consultants:   CT surgery  Procedures:   Chest tube placement  Chest tube removed 12/17/2017  Antimicrobials: Anti-infectives (From admission, onward)   None      Subjective: Patient with continued pain around chest tube site  Objective: Vitals:   12/16/17 2300 12/17/17 0300 12/17/17 1203 12/17/17 1557  BP: 109/60 (!) 105/57 117/64 118/68  Pulse: 80 62 79   Resp: 18 18    Temp: 97.8 F (36.6 C) 97.7 F (36.5 C) 98.2 F (36.8 C) 98.4 F (36.9 C)  TempSrc: Oral Oral Oral Oral  SpO2: 98% 97% 99%   Weight:      Height:        Intake/Output Summary (Last 24 hours) at 12/17/2017 1609 Last data filed at 12/17/2017 0900 Gross per 24 hour  Intake 240 ml  Output 10 ml  Net 230 ml   Filed Weights   12/13/17 0613 12/14/17 0407 12/16/17 0349  Weight: 79.8 kg (175 lb 14.8 oz) 77.2 kg (170 lb 3.1 oz) 76 kg (167 lb 8 oz)    Examination: General exam: Awake, laying in bed, in nad Respiratory system: Normal respiratory effort, no wheezing chest tube in place this morning Cardiovascular system: regular rate, s1, s2 Gastrointestinal system: Soft, nondistended, positive BS Central nervous system: CN2-12 grossly intact, strength intact Extremities: Perfused, no  clubbing Skin: Normal skin turgor, no notable skin lesions seen Psychiatry: Mood normal // no visual hallucinations   Data Reviewed: I have personally reviewed  following labs and imaging studies  CBC: Recent Labs  Lab 12/13/17 0031  WBC 7.3  HGB 12.6  HCT 36.8  MCV 92.5  PLT 063   Basic Metabolic Panel: Recent Labs  Lab 12/13/17 0031 12/17/17 0211  NA 141 139  K 3.5 4.0  CL 106 106  CO2 24 24  GLUCOSE 102* 118*  BUN 20 23*  CREATININE 1.13* 0.96  CALCIUM 8.8* 9.0   GFR: Estimated Creatinine Clearance: 73.8 mL/min (by C-G formula based on SCr of 0.96 mg/dL). Liver Function Tests: No results for input(s): AST, ALT, ALKPHOS, BILITOT, PROT, ALBUMIN in the last 168 hours. No results for input(s): LIPASE, AMYLASE in the last 168 hours. No results for input(s): AMMONIA in the last 168 hours. Coagulation Profile: Recent Labs  Lab 12/13/17 0726  INR 1.09   Cardiac Enzymes: Recent Labs  Lab 12/13/17 0032  TROPONINI <0.03   BNP (last 3 results) No results for input(s): PROBNP in the last 8760 hours. HbA1C: No results for input(s): HGBA1C in the last 72 hours. CBG: No results for input(s): GLUCAP in the last 168 hours. Lipid Profile: No results for input(s): CHOL, HDL, LDLCALC, TRIG, CHOLHDL, LDLDIRECT in the last 72 hours. Thyroid Function Tests: No results for input(s): TSH, T4TOTAL, FREET4, T3FREE, THYROIDAB in the last 72 hours. Anemia Panel: No results for input(s): VITAMINB12, FOLATE, FERRITIN, TIBC, IRON, RETICCTPCT in the last 72 hours. Sepsis Labs: No results for input(s): PROCALCITON, LATICACIDVEN in the last 168 hours.  Recent Results (from the past 240 hour(s))  MRSA PCR Screening     Status: Abnormal   Collection Time: 12/13/17  6:09 AM  Result Value Ref Range Status   MRSA by PCR POSITIVE (A) NEGATIVE Final    Comment:        The GeneXpert MRSA Assay (FDA approved for NASAL specimens only), is one component of a comprehensive MRSA colonization surveillance program. It is not intended to diagnose MRSA infection nor to guide or monitor treatment for MRSA infections. RESULT CALLED TO, READ BACK BY AND  VERIFIED WITH: B.RONCALLO RN AT 0845 12/13/17 BY A.DAVIS Performed at [facility], [place] [address], [place],  [address]      Radiology Studies: Dg Chest Port 1 View  Result Date: 12/17/2017 CLINICAL DATA:  Chest tube present,ptx EXAM: PORTABLE CHEST 1 VIEW COMPARISON:  12/16/2017 FINDINGS: Patient has a LEFT-sided chest tube. There is minimal subsegmental atelectasis at the LEFT lung base. Small LEFT apical pneumothorax appears stable, measuring less than 5% of lung volume. IMPRESSION: Stable appearance of small LEFT apical pneumothorax. Electronically Signed   By: Nolon Nations M.D.   On: 12/17/2017 09:37   Dg Chest Port 1 View  Result Date: 12/16/2017 CLINICAL DATA:  Follow-up left-sided pneumothorax with chest tube treatment. EXAM: PORTABLE CHEST 1 VIEW COMPARISON:  Portable chest x-ray of December 15, 2017 FINDINGS: A faint pleural line is visible in the left apex similar to that seen yesterday. The left chest tube tip projects over the posterior aspect of the eighth rib with the proximal side hole lying outside the thoracic cavity. There is a small amount of pleural fluid on the left. There is minimal left basilar atelectasis. The heart and pulmonary vascularity are normal. There is calcification in the wall of the aortic arch. IMPRESSION: Less than 5% left apical pneumothorax. Stable appearing  left chest tube with the proximal side hole lying outside the thoracic cavity. Minimal left basilar atelectasis and small left pleural effusion. Overall there has not been significant change since yesterday's study. Electronically Signed   By: David  Martinique M.D.   On: 12/16/2017 09:46    Scheduled Meds: . anastrozole  1 mg Oral Daily  . calcium carbonate  1 tablet Oral Daily  . Chlorhexidine Gluconate Cloth  6 each Topical Q0600  . enoxaparin (LOVENOX) injection  40 mg Subcutaneous Q24H  . levothyroxine  175 mcg Oral QAC breakfast  . loratadine  10 mg Oral Daily  . mupirocin ointment  1  application Nasal BID  . ondansetron (ZOFRAN) IV  4 mg Intravenous Q6H  . pantoprazole  40 mg Oral Daily  . scopolamine  1 patch Transdermal Q72H  . Vitamin D (Ergocalciferol)  50,000 Units Oral Q Wed   Continuous Infusions:   LOS: 4 days   Marylu Lund, MD [facility] Pager [phone]  If 7PM-7AM, please contact night-coverage [email or web address] Password [email or web address] 12/17/2017, 4:09 PM

## 2017-12-17 NOTE — Progress Notes (Signed)
Chest tube removed; pt laying flat when removed; pt tolerated fair; 2 sets of sutures tied tightly to skin; new dressing in place C/D/I.   [place]  Moroni Nester, RN

## 2017-12-17 NOTE — Progress Notes (Signed)
IV painful/swollen at/above site; put in for Iv team consult bc difficult stick; will give pain meds once access obtained then pull chest tube.   [place]  Janice Bodine, RN

## 2017-12-17 NOTE — Progress Notes (Addendum)
      [address]       [place] [address]             [phone]           Subjective: Patient states Toradol helped a lot yesterday.  Objective: Vital signs in last 24 hours: Temp:  [97.7 F (36.5 C)-98.5 F (36.9 C)] 97.7 F (36.5 C) (06/06 0300) Pulse Rate:  [62-85] 62 (06/06 0300) Cardiac Rhythm: Normal sinus rhythm;Bundle branch block (06/06 0702) Resp:  [18] 18 (06/06 0300) BP: (105-119)/(53-65) 105/57 (06/06 0300) SpO2:  [97 %-100 %] 97 % (06/06 0300)     Intake/Output from previous day: 06/05 0701 - 06/06 0700 In: -  Out: 10 [Chest Tube:10]   Physical Exam:  Cardiovascular: RRR Pulmonary: Mostly clear to auscultation Chest tube: to water seal, no air leak (tidling with cough but no true air leak)  Lab Results: CBC: No results for input(s): WBC, HGB, HCT, PLT in the last 72 hours. BMET:  Recent Labs    12/17/17 0211  NA 139  K 4.0  CL 106  CO2 24  GLUCOSE 118*  BUN 23*  CREATININE 0.96  CALCIUM 9.0    PT/INR:  No results for input(s): LABPROT, INR in the last 72 hours. ABG:  INR: Will add last result for INR, ABG once components are confirmed Will add last 4 CBG results once components are confirmed  Assessment/Plan:  1. CV - SR in the 60's. 2.  Pulmonary -  On room air.Chest tube with scant output last 24 hours. Chest tube is to water seal and there is no air leak.  CXR this am appears stable. Will remove chest tube. Check CXR in am. 3. Per patient request, Toradol, prior to pulling chest tube. 4. May be discharged in am if CXR stable;follow up appointment arranged  Donielle M ZimmermanPA-C 12/17/2017,7:34 AM    I have seen and examined the patient and agree with the assessment and plan as outlined.  D/C tube  Rexene Alberts, MD 12/17/2017 11:48 AM

## 2017-12-17 NOTE — Progress Notes (Signed)
Pt's L chest suture site dressing C/D/I.   [place]  Briannia Laba, RN

## 2017-12-18 ENCOUNTER — Inpatient Hospital Stay (HOSPITAL_COMMUNITY): Payer: BLUE CROSS/BLUE SHIELD

## 2017-12-18 MED ORDER — OXYCODONE-ACETAMINOPHEN 5-325 MG PO TABS: 1.0000 | ORAL_TABLET | Freq: Four times a day (QID) | ORAL | 0 refills | Status: DC | PRN

## 2017-12-18 NOTE — Progress Notes (Addendum)
      [address]       [place] [address]             [phone]           Subjective: Patient just waking up this am. No specific complaints.  Objective: Vital signs in last 24 hours: Temp:  [97.8 F (36.6 C)-98.6 F (37 C)] 97.8 F (36.6 C) (06/07 0322) Pulse Rate:  [79-87] 79 (06/07 0322) Cardiac Rhythm: Normal sinus rhythm (06/07 0322) Resp:  [17-18] 17 (06/07 0322) BP: (105-118)/(52-68) 108/52 (06/07 0322) SpO2:  [99 %-100 %] 100 % (06/07 0322)     Intake/Output from previous day: 06/06 0701 - 06/07 0700 In: 750 [P.O.:720] Out: -    Physical Exam:  Cardiovascular: RRR Pulmonary: Mostly clear to auscultation  Lab Results: CBC: No results for input(s): WBC, HGB, HCT, PLT in the last 72 hours. BMET:  Recent Labs    12/17/17 0211  NA 139  K 4.0  CL 106  CO2 24  GLUCOSE 118*  BUN 23*  CREATININE 0.96  CALCIUM 9.0    PT/INR:  No results for input(s): LABPROT, INR in the last 72 hours. ABG:  INR: Will add last result for INR, ABG once components are confirmed Will add last 4 CBG results once components are confirmed  Assessment/Plan:  1. CV - SR in the 60's. 2.  Pulmonary -  On room air.Chest tube removed yesterday.  CXR this am appears stable.  Await this am's CXR-not taken yet. 4. May be discharged if CXR stable;follow up appointment arranged  Donielle M ZimmermanPA-C 12/18/2017,7:10 AM    I have seen and examined the patient and agree with the assessment and plan as outlined.  CXR looks fine.  D/C home  Rexene Alberts, MD 12/18/2017 9:20 AM

## 2017-12-18 NOTE — Discharge Instructions (Signed)

## 2017-12-18 NOTE — Progress Notes (Signed)
New prescription being signed by MD. Williemae Area pt out once script returned.   [place]  Hitoshi Werts, RN

## 2017-12-18 NOTE — Progress Notes (Signed)
MD approved one dose of Dilaudid IV before leaving as pt has pain now and anticipates inc pain on way home.   [place]  Mena Simonis, RN

## 2017-12-18 NOTE — Discharge Summary (Signed)
Physician Discharge Summary  Stacy Chung:[id number] DOB: 22-Aug-1970 DOA: 12/12/2017  PCP: [name], MD  Admit date: 12/12/2017 Discharge date: 12/18/2017  Admitted From: Home Disposition:  Home  Recommendations for Outpatient Follow-up:  1. Follow up with PCP in 2-3 weeks 2. Follow up with CT Surgery as scheduled  Discharge Condition:Improved CODE STATUS:Full Diet recommendation: Regular   Brief/Interim Summary: 47 y.o.femalewith medical history significant ofmultiple malignancy (melanoma, breast cancer, Hodgkin disease, thyroid cancer), PE, GERD, hypothyroidism, depression, who presents withchest pain and SOB.  Pt states that she initially started havingleft neck and shoulder paininam on Saturday while patient was in [place], then started havingleft lower chest pain.The pain is constant, sharp, 8 out of 10 in severity, nonradiating. It is associated with mild shortness of breath. It is aggravated by laying flat and leaning forward. No cough, fever or chills. Patient denies nausea, vomiting, diarrhea, abdominal pain, symptoms of UTI or unilateral weakness.Of note, pt has hx ofat least 2 previous pneumothoraces, requiring thoracotomy.  ED Course:pt was found to haveWBC 7.3, negative troponin, acute renal injury with creatinine 1.13, temperature normal, tachycardia, tachypnea, oxygen saturation 97% on room air, chest x-ray showed left large pneumothorax. CT angiogram is negative for PE, but showed large left pneumothorax.Patient is admitted to stepdown as inpatient. Thoracic surgery, Dr. Herbie Saxon consulted.  Pneumothorax on left:Noted CTA and CX- status post left chest tube placement.  CT surgery following.  Minimal output noted.  Chest tube has since been discontinued as of 12/17/2017. Repeat CXR on 6/7 stable, reviewed. Stable for d/c per CT surgery  Hx ofmultiple malignancy (melanoma, breast cancer, Hodgkin disease, thyroid cancer):following up  with Dr. Lindi Adie. Currently is on anastrozole for breast cancer -Continue anastrozole as tolerated -recommend follow-up with Dr. Gardiner Fanti an outpatient.  Hypothyroidism: -Continue home Synthroid175 MCG daily she tolerates  Depression:Stable, no suicidal or homicidal ideations. -Wellbutrin have been placed on holddue QTc prolongation 511  GERD: -Continue with Protonix as tolerated  AKI: -Resolved -Repeat basic metabolic panel reviewed, creatinine normalized  Discharge Diagnoses:  Principal Problem:   Pneumothorax on left Active Problems:   Breast cancer of lower-outer quadrant of right female breast ([place])   Hypothyroidism   GERD (gastroesophageal reflux disease)   Depression   AKI (acute kidney injury) ([place])    Discharge Instructions   Allergies as of 12/18/2017      Reactions   Other Rash   "bandaides"   Macrodantin    Pertussis Vaccines    Tape Rash   "plastic tape"      Medication List    TAKE these medications   AMBULATORY NON FORMULARY MEDICATION Myofascial release therapy and massage therapy 2-3 times a week as needed   anastrozole 1 MG tablet Commonly known as:  ARIMIDEX TAKE 1 TABLET(1 MG) BY MOUTH DAILY   Biotin 2500 MCG Caps Take 5,000 mcg by mouth daily.   buPROPion HCl ER (XL) 450 MG Tb24 Take 450 mg by mouth daily. What changed:  Another medication with the same name was removed. Continue taking this medication, and follow the directions you see here.   CALCIUM PO Take 1,200 mg by mouth daily.   cetirizine 5 MG tablet Commonly known as:  ZYRTEC Take 1 tablet (5 mg total) by mouth daily. What changed:  how much to take   COLLAGEN PO Take by mouth daily.   CURCUMIN 95 PO Take 65 mg by mouth daily.   esomeprazole 40 MG capsule Commonly known as:  NEXIUM Take 1 capsule (40 mg  total) by mouth daily at 12 noon.   fexofenadine-pseudoephedrine 180-240 MG 24 hr tablet Commonly known as:  ALLEGRA-D 24 Take 1 tablet by mouth  daily.   fluticasone 50 MCG/ACT nasal spray Commonly known as:  FLONASE Place 1 spray into both nostrils as needed for allergies or rhinitis.   levothyroxine 175 MCG tablet Commonly known as:  SYNTHROID, LEVOTHROID Take 175 mcg by mouth daily.   naproxen sodium 220 MG tablet Commonly known as:  ALEVE Take 220 mg by mouth 2 (two) times daily as needed (pain).   Prasterone 6.5 MG Inst Commonly known as:  [employer] 1 application vaginally daily.   tetrahydrozoline 0.05 % ophthalmic solution Place 1 drop into both eyes as needed (dry eyes).   Vitamin D3 10000 units capsule Take 10,000 Units by mouth daily. Only 5 days a week   zolmitriptan 5 MG tablet Commonly known as:  ZOMIG Take 5 mg by mouth as needed.      Follow-up Information    [facility] [place] Follow up on 12/28/2017.   Specialty:  Cardiothoracic Surgery Why:  Appointment is at 3:30, please get CXR at 3:00 at [place] located on first floor of our office building Contact information: [place], [place] [phone]       [name], MD. Schedule an appointment as soon as possible for a visit in 2 week(s).   Specialties:  Family Medicine, Sports Medicine, Radiology Contact information: [phone]          Allergies  Allergen Reactions  . Other Rash    "bandaides"  . Macrodantin   . Pertussis Vaccines   . Tape Rash    "plastic tape"    Consultations:  CT surgery  Procedures/Studies: Dg Chest 2 View  Result Date: 12/18/2017 CLINICAL DATA:  Left pneumothorax. EXAM: CHEST - 2 VIEW COMPARISON:  Radiograph of Nov 16, 2017. FINDINGS: The heart size and mediastinal contours are within normal limits. No pneumothorax is noted status post left-sided chest tube removal. Right lung is clear. Minimal left basilar subsegmental atelectasis and  minimal left pleural effusion is noted. The visualized skeletal structures are unremarkable. IMPRESSION: No pneumothorax status post left-sided chest tube removal. Minimal left basilar subsegmental atelectasis with minimal left pleural effusion is noted. Electronically Signed   By: Marijo Conception, M.D.   On: 12/18/2017 09:17   Dg Chest 2 View  Result Date: 12/14/2017 CLINICAL DATA:  Pneumothorax EXAM: CHEST - 2 VIEW COMPARISON:  December 13, 2017. FINDINGS: There is a persistent pneumothorax in the left base region without tension component which is stable. There is small associated pleural effusion on the left. There is are areas of postoperative change and atelectasis on the left, stable. No new opacity on either side. No consolidation. There is apical scarring bilaterally. Heart size and pulmonary vascularity are normal. Postoperative changes are noted at the cervical-thoracic junction as well as surgical clips in each axillary region. IMPRESSION: Stable left base pneumothorax with small associated pleural effusion. No tension component. Areas of atelectatic change and scarring remains stable. No new opacity. Heart size normal. Electronically Signed   By: Lowella Grip III M.D.   On: 12/14/2017 07:27   Dg Chest 2 View  Result Date: 12/13/2017 CLINICAL DATA:  Pneumothorax. EXAM: CHEST - 2 VIEW COMPARISON:  Chest x-ray dated 12/12/2017. Chest CT angiogram dated 12/13/2017. FINDINGS: Persistent pneumothorax at the LEFT lung  base, with overlying airspace collapse. RIGHT lung is clear. Heart size and mediastinal contours are within normal limits. Surgical clips overlying the LEFT chest wall. No acute or suspicious osseous finding. IMPRESSION: 1. Stable appearance of the pneumothorax at the LEFT lung base, with associated overlying airspace collapse. 2. No new lung findings. Electronically Signed   By: Franki Cabot M.D.   On: 12/13/2017 08:57   Dg Chest 2 View  Result Date: 12/13/2017 CLINICAL DATA:   47 year old female with acute chest pain. EXAM: CHEST - 2 VIEW COMPARISON:  03/17/2017 PET CT and prior studies FINDINGS: A moderate to large LEFT basilar pneumothorax is noted. No mediastinal shift. The cardiomediastinal silhouette is unremarkable. No pleural effusion, airspace disease or acute bony abnormality. Breast surgical changes identified. IMPRESSION: Moderate to large LEFT basilar pneumothorax.  No mediastinal shift. Critical Value/emergent results were called by telephone at the time of interpretation on 12/13/2017 at 12:05 am to Dr. Veatrice Kells , who verbally acknowledged these results. Electronically Signed   By: Margarette Canada M.D.   On: 12/13/2017 00:05   Ct Angio Chest Pe W And/or Wo Contrast  Result Date: 12/13/2017 CLINICAL DATA:  Assess left-sided pneumothorax. Personal history of Hodgkin's lymphoma. Assess for pulmonary embolus. EXAM: CT ANGIOGRAPHY CHEST WITH CONTRAST TECHNIQUE: Multidetector CT imaging of the chest was performed using the standard protocol during bolus administration of intravenous contrast. Multiplanar CT image reconstructions and MIPs were obtained to evaluate the vascular anatomy. CONTRAST:  128mL ISOVUE-370 IOPAMIDOL (ISOVUE-370) INJECTION 76% COMPARISON:  Chest radiograph performed 12/12/2017, and PET/CT performed 03/17/2017 FINDINGS: Cardiovascular:  There is no evidence of pulmonary embolus. The heart is normal in size. The thoracic aorta is unremarkable. The great vessels are within normal limits. Mediastinum/Nodes: The mediastinum is unremarkable in appearance. No mediastinal lymphadenopathy is seen. No pericardial effusion is identified. Postoperative change is noted along the anterior mediastinum. The patient is status post thyroidectomy. No axillary lymphadenopathy is seen. Scattered clips are seen at the axilla bilaterally. Lungs/Pleura: A large left-sided pneumothorax is noted. There is residual partial expansion of the left upper and lower lobes. Mild bilateral  atelectasis is noted. No mass is seen. Upper Abdomen: The visualized portions of the liver and residual splenule are unremarkable in appearance. The visualized portions of the adrenal glands and kidneys are within normal limits. The pancreas is grossly unremarkable. Musculoskeletal: No acute osseous abnormalities are identified. The visualized musculature is unremarkable in appearance. Review of the MIP images confirms the above findings. IMPRESSION: 1. No evidence of pulmonary embolus. 2. Large left-sided pneumothorax noted, with residual partial expansion of the left upper and lower lung lobes. 3. Mild bilateral atelectasis noted. Critical Value/emergent results were called by telephone at the time of interpretation on 12/13/2017 at 1:46 am to Dr. Veatrice Kells, who verbally acknowledged these results. Electronically Signed   By: Garald Balding M.D.   On: 12/13/2017 01:53   Dg Chest Port 1 View  Result Date: 12/17/2017 CLINICAL DATA:  Chest tube present,ptx EXAM: PORTABLE CHEST 1 VIEW COMPARISON:  12/16/2017 FINDINGS: Patient has a LEFT-sided chest tube. There is minimal subsegmental atelectasis at the LEFT lung base. Small LEFT apical pneumothorax appears stable, measuring less than 5% of lung volume. IMPRESSION: Stable appearance of small LEFT apical pneumothorax. Electronically Signed   By: Nolon Nations M.D.   On: 12/17/2017 09:37   Dg Chest Port 1 View  Result Date: 12/16/2017 CLINICAL DATA:  Follow-up left-sided pneumothorax with chest tube treatment. EXAM: PORTABLE CHEST 1 VIEW COMPARISON:  Portable  chest x-ray of December 15, 2017 FINDINGS: A faint pleural line is visible in the left apex similar to that seen yesterday. The left chest tube tip projects over the posterior aspect of the eighth rib with the proximal side hole lying outside the thoracic cavity. There is a small amount of pleural fluid on the left. There is minimal left basilar atelectasis. The heart and pulmonary vascularity are normal.  There is calcification in the wall of the aortic arch. IMPRESSION: Less than 5% left apical pneumothorax. Stable appearing left chest tube with the proximal side hole lying outside the thoracic cavity. Minimal left basilar atelectasis and small left pleural effusion. Overall there has not been significant change since yesterday's study. Electronically Signed   By: David  Martinique M.D.   On: 12/16/2017 09:46   Dg Chest Port 1 View  Result Date: 12/15/2017 CLINICAL DATA:  47 year old female with left-sided pneumothorax. Subsequent encounter. EXAM: PORTABLE CHEST 1 VIEW COMPARISON:  12/14/2017 chest x-ray. FINDINGS: Left chest tube in place. Side hole remains outside the chest cavity. Questionable tiny left apical pneumothorax. Blunting left costophrenic angle may represent small amount of pleural fluid. Minimal left base atelectasis. Scarring lung apices. Mild calcification aortic knob. IMPRESSION: Left chest tube in place. Side hole remains outside the chest cavity. Questionable tiny left apical pneumothorax. Blunting left costophrenic angle may represent small amount of pleural fluid. Minimal left base atelectasis. Aortic Atherosclerosis (ICD10-I70.0). Electronically Signed   By: Genia Del M.D.   On: 12/15/2017 08:24   Dg Chest Port 1 View  Result Date: 12/14/2017 CLINICAL DATA:  Chest tube placement.  Follow up pneumothorax. EXAM: PORTABLE CHEST 1 VIEW COMPARISON:  Earlier same day. FINDINGS: 0944 hours. Interval chest tube placement inferiorly in the left hemithorax. The side hole of the chest tube is external to the pleural space. No residual basilar pneumothorax identified. There are lower lung volumes with mildly increased bibasilar atelectasis. There is scarring in both apices. The heart size and mediastinal contours are stable. Surgical clips are present in the lower neck, both breasts and the upper abdomen. IMPRESSION: Successful evacuation of left basilar pneumothorax following chest tube placement.  Side hole of the chest tube is external to the pleural space. Electronically Signed   By: Richardean Sale M.D.   On: 12/14/2017 10:00     Subjective: Eager to go home  Discharge Exam: Vitals:   12/18/17 0322 12/18/17 0738  BP: (!) 108/52 107/60  Pulse: 79 69  Resp: 17 18  Temp: 97.8 F (36.6 C) 97.8 F (36.6 C)  SpO2: 100% 100%   Vitals:   12/17/17 1921 12/17/17 2322 12/18/17 0322 12/18/17 0738  BP: (!) 109/58 (!) 105/52 (!) 108/52 107/60  Pulse: 87 82 79 69  Resp: 18 18 17 18   Temp: 98.6 F (37 C) 97.9 F (36.6 C) 97.8 F (36.6 C) 97.8 F (36.6 C)  TempSrc: Oral Oral Oral Oral  SpO2: 99% 99% 100% 100%  Weight:      Height:        General: Pt is alert, awake, not in acute distress Cardiovascular: RRR, S1/S2 +, no rubs, no gallops Respiratory: CTA bilaterally, no wheezing, no rhonchi Abdominal: Soft, NT, ND, bowel sounds + Extremities: no edema, no cyanosis   The results of significant diagnostics from this hospitalization (including imaging, microbiology, ancillary and laboratory) are listed below for reference.     Microbiology: Recent Results (from the past 240 hour(s))  MRSA PCR Screening     Status: Abnormal  Collection Time: 12/13/17  6:09 AM  Result Value Ref Range Status   MRSA by PCR POSITIVE (A) NEGATIVE Final    Comment:        The GeneXpert MRSA Assay (FDA approved for NASAL specimens only), is one component of a comprehensive MRSA colonization surveillance program. It is not intended to diagnose MRSA infection nor to guide or monitor treatment for MRSA infections. RESULT CALLED TO, READ BACK BY AND VERIFIED WITH: B.RONCALLO RN AT 0845 12/13/17 BY A.DAVIS Performed at [facility], [place] [address] [place], [place] [address]      Labs: BNP (last 3 results) No results for input(s): BNP in the last 8760 hours. Basic Metabolic Panel: Recent Labs  Lab 12/13/17 0031 12/17/17 0211  NA 141 139  K 3.5 4.0  CL 106 106  CO2 24 24   GLUCOSE 102* 118*  BUN 20 23*  CREATININE 1.13* 0.96  CALCIUM 8.8* 9.0   Liver Function Tests: No results for input(s): AST, ALT, ALKPHOS, BILITOT, PROT, ALBUMIN in the last 168 hours. No results for input(s): LIPASE, AMYLASE in the last 168 hours. No results for input(s): AMMONIA in the last 168 hours. CBC: Recent Labs  Lab 12/13/17 0031  WBC 7.3  HGB 12.6  HCT 36.8  MCV 92.5  PLT 379   Cardiac Enzymes: Recent Labs  Lab 12/13/17 0032  TROPONINI <0.03   BNP: Invalid input(s): POCBNP CBG: No results for input(s): GLUCAP in the last 168 hours. D-Dimer No results for input(s): DDIMER in the last 72 hours. Hgb A1c No results for input(s): HGBA1C in the last 72 hours. Lipid Profile No results for input(s): CHOL, HDL, LDLCALC, TRIG, CHOLHDL, LDLDIRECT in the last 72 hours. Thyroid function studies No results for input(s): TSH, T4TOTAL, T3FREE, THYROIDAB in the last 72 hours.  Invalid input(s): FREET3 Anemia work up No results for input(s): VITAMINB12, FOLATE, FERRITIN, TIBC, IRON, RETICCTPCT in the last 72 hours. Urinalysis No results found for: COLORURINE, APPEARANCEUR, [place], [place], [place], [place], [place], [place], PROTEINUR, UROBILINOGEN, NITRITE, LEUKOCYTESUR Sepsis Labs Invalid input(s): PROCALCITONIN,  WBC,  LACTICIDVEN Microbiology Recent Results (from the past 240 hour(s))  MRSA PCR Screening     Status: Abnormal   Collection Time: 12/13/17  6:09 AM  Result Value Ref Range Status   MRSA by PCR POSITIVE (A) NEGATIVE Final    Comment:        The GeneXpert MRSA Assay (FDA approved for NASAL specimens only), is one component of a comprehensive MRSA colonization surveillance program. It is not intended to diagnose MRSA infection nor to guide or monitor treatment for MRSA infections. RESULT CALLED TO, READ BACK BY AND VERIFIED WITH: B.RONCALLO RN AT 0845 12/13/17 BY A.DAVIS Performed at [facility], [place] [address] [place], [place]  [address]    Time spent: 36min  SIGNED:   Marylu Lund, MD  [facility] 12/18/2017, 9:33 AM  If 7PM-7AM, please contact night-coverage [email or web address] Password [email or web address]

## 2017-12-18 NOTE — Progress Notes (Signed)
IV removed; educated on d/c instructions; belongings collected; will wheel out once ready.   [place]  Celestine Prim, RN

## 2017-12-25 ENCOUNTER — Other Ambulatory Visit: Payer: Self-pay | Admitting: Thoracic Surgery (Cardiothoracic Vascular Surgery)

## 2017-12-25 DIAGNOSIS — J939 Pneumothorax, unspecified: Secondary | ICD-10-CM

## 2017-12-28 ENCOUNTER — Ambulatory Visit (INDEPENDENT_AMBULATORY_CARE_PROVIDER_SITE_OTHER): Payer: BLUE CROSS/BLUE SHIELD | Admitting: Physician Assistant

## 2017-12-28 ENCOUNTER — Telehealth: Payer: Self-pay

## 2017-12-28 ENCOUNTER — Other Ambulatory Visit: Payer: Self-pay

## 2017-12-28 ENCOUNTER — Ambulatory Visit
Admission: RE | Admit: 2017-12-28 | Discharge: 2017-12-28 | Disposition: A | Discharge status: Home / Self Care | Payer: BLUE CROSS/BLUE SHIELD | Source: Ambulatory Visit | Attending: Thoracic Surgery (Cardiothoracic Vascular Surgery) | Admitting: Thoracic Surgery (Cardiothoracic Vascular Surgery)

## 2017-12-28 VITALS — BP 100/69 | HR 88 | Resp 16 | Ht 64.5 in | Wt 165.0 lb

## 2017-12-28 DIAGNOSIS — J939 Pneumothorax, unspecified: Secondary | ICD-10-CM

## 2017-12-28 NOTE — Patient Instructions (Signed)

## 2017-12-28 NOTE — Telephone Encounter (Signed)
Stacy Chung called after being seen in the office by Ellwood Handler, PA.  She stated that she forgot to ask if it was ok to drive.  I advised her that she can drive.  However, she still needs to be mindful of lifting.  She is to gradually increased her lifting and not over do it.  She acknowledged receipt.  All questions answered.

## 2017-12-28 NOTE — Progress Notes (Signed)
HPI:  Patient returns for routine postoperative follow-up having undergone chest tube placement for left sided pneumothorax.  She was discharged home on 12/18/2017 at which time patients CXR showed no evidence of pneumothorax. The patient presents today for hospital follow up and repeat CXR.  She states she feels pretty good.  She continues to have some discomfort along the middle of her back.  She denies shortness of breath.    Current Outpatient Medications  Medication Sig Dispense Refill  . AMBULATORY NON FORMULARY MEDICATION Myofascial release therapy and massage therapy 2-3 times a week as needed 1 each 0  . anastrozole (ARIMIDEX) 1 MG tablet TAKE 1 TABLET(1 MG) BY MOUTH DAILY 90 tablet 3  . Biotin 2500 MCG CAPS Take 5,000 mcg by mouth daily. 30 capsule   . buPROPion HCl ER, XL, 450 MG TB24 Take 450 mg by mouth daily. 90 tablet 3  . CALCIUM PO Take 1,200 mg by mouth daily.     . cetirizine (ZYRTEC) 5 MG tablet Take 1 tablet (5 mg total) by mouth daily. (Patient taking differently: Take 10 mg by mouth daily. ) 30 tablet 3  . Cholecalciferol (VITAMIN D3) 10000 UNITS capsule Take 10,000 Units by mouth daily. Only 5 days a week     . COLLAGEN PO Take by mouth daily.    Marland Kitchen esomeprazole (NEXIUM) 40 MG capsule Take 1 capsule (40 mg total) by mouth daily at 12 noon.    . fexofenadine-pseudoephedrine (ALLEGRA-D 24) 180-240 MG 24 hr tablet Take 1 tablet by mouth daily.    . fluticasone (FLONASE) 50 MCG/ACT nasal spray Place 1 spray into both nostrils as needed for allergies or rhinitis.    Marland Kitchen levothyroxine (SYNTHROID, LEVOTHROID) 175 MCG tablet Take 175 mcg by mouth daily.     . naproxen sodium (ALEVE) 220 MG tablet Take 220 mg by mouth 2 (two) times daily as needed (pain).    Marland Kitchen oxyCODONE-acetaminophen (PERCOCET) 5-325 MG tablet Take 1 tablet by mouth every 6 (six) hours as needed for severe pain. 20 tablet 0  . Prasterone (INTRAROSA) 6.5 MG INST Place 1 application vaginally daily. 30 each 2  .  tetrahydrozoline 0.05 % ophthalmic solution Place 1 drop into both eyes as needed (dry eyes).    . Turmeric (CURCUMIN 95 PO) Take 65 mg by mouth daily.    Marland Kitchen zolmitriptan (ZOMIG) 5 MG tablet Take 5 mg by mouth as needed.      No current facility-administered medications for this visit.     Physical Exam:  BP 100/69 (BP Location: Left Arm, Patient Position: Sitting, Cuff Size: Large)   Pulse 88   Resp 16   Ht 5' 4.5" (1.638 m)   Wt 165 lb (74.8 kg)   LMP 02/29/2016   SpO2 96% Comment: on ra  BMI 27.88 kg/m   Gen: no apparent distress Heart: RRR Lungs: CTA bilaterally Incision: sutures removed, site looks good  Diagnostic Tests:  CXR; no pneumothorax present  A/P;  1.Spontaneous pneumothorax requiring chest tube placement- d/c from hospital on 6/7.Marland Kitchen Follow up CXR shows no pneumothorax 2. RTC prn   Ellwood Handler, PA-C [facility] [phone]

## 2018-03-09 DIAGNOSIS — M62830 Muscle spasm of back: Secondary | ICD-10-CM | POA: Diagnosis not present

## 2018-03-09 DIAGNOSIS — M5413 Radiculopathy, cervicothoracic region: Secondary | ICD-10-CM | POA: Diagnosis not present

## 2018-03-09 DIAGNOSIS — R51 Headache: Secondary | ICD-10-CM | POA: Diagnosis not present

## 2018-03-09 DIAGNOSIS — M546 Pain in thoracic spine: Secondary | ICD-10-CM | POA: Diagnosis not present

## 2018-03-25 ENCOUNTER — Ambulatory Visit (INDEPENDENT_AMBULATORY_CARE_PROVIDER_SITE_OTHER): Payer: BLUE CROSS/BLUE SHIELD

## 2018-03-25 ENCOUNTER — Encounter: Payer: Self-pay | Admitting: Sports Medicine

## 2018-03-25 ENCOUNTER — Ambulatory Visit (INDEPENDENT_AMBULATORY_CARE_PROVIDER_SITE_OTHER): Payer: BLUE CROSS/BLUE SHIELD | Admitting: Sports Medicine

## 2018-03-25 DIAGNOSIS — M25511 Pain in right shoulder: Secondary | ICD-10-CM

## 2018-03-25 DIAGNOSIS — M47812 Spondylosis without myelopathy or radiculopathy, cervical region: Secondary | ICD-10-CM | POA: Diagnosis not present

## 2018-03-25 DIAGNOSIS — M75101 Unspecified rotator cuff tear or rupture of right shoulder, not specified as traumatic: Secondary | ICD-10-CM

## 2018-03-25 DIAGNOSIS — H6980 Other specified disorders of Eustachian tube, unspecified ear: Secondary | ICD-10-CM | POA: Insufficient documentation

## 2018-03-25 DIAGNOSIS — M75102 Unspecified rotator cuff tear or rupture of left shoulder, not specified as traumatic: Secondary | ICD-10-CM

## 2018-03-25 DIAGNOSIS — H6983 Other specified disorders of Eustachian tube, bilateral: Secondary | ICD-10-CM

## 2018-03-25 DIAGNOSIS — H6990 Unspecified Eustachian tube disorder, unspecified ear: Secondary | ICD-10-CM | POA: Insufficient documentation

## 2018-03-25 DIAGNOSIS — M542 Cervicalgia: Secondary | ICD-10-CM

## 2018-03-25 DIAGNOSIS — M19011 Primary osteoarthritis, right shoulder: Secondary | ICD-10-CM

## 2018-03-25 DIAGNOSIS — H6993 Unspecified Eustachian tube disorder, bilateral: Secondary | ICD-10-CM

## 2018-03-25 DIAGNOSIS — J984 Other disorders of lung: Secondary | ICD-10-CM | POA: Diagnosis not present

## 2018-03-25 MED ORDER — FLUTICASONE PROPIONATE 50 MCG/ACT NA SUSP: NASAL | 3 refills | Status: DC

## 2018-03-25 NOTE — Assessment & Plan Note (Signed)
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

## 2018-03-25 NOTE — Assessment & Plan Note (Signed)
Completely normal ear exam. Adding Flonase, continue decongestants.

## 2018-03-25 NOTE — Assessment & Plan Note (Addendum)
Pain was referrable more to the right glenohumeral joint and biceps tendon today. Glenohumeral injection, the glenohumeral space does communicate with the bicipital sheath. If persistent discomfort we will proceed with MR arthrogram looking for a labral tear.

## 2018-03-25 NOTE — Progress Notes (Signed)
Subjective:    CC: Several issues  HPI: Right shoulder pain: Historically bicipital related, we did a biceps tendon sheath and subcoracoid space injection about 8 months ago and she had good relief, now having recurrence of pain, anterior and posterior joint lines.  Moderate, persistent without radiation.  Neck pain: Localized in the neck in the left and right trapezius, periscapular.  Nothing overtly radicular down to the hands or fingertips.  History of left-sided pneumothorax and she tells me it felt more like neck pain when it occurred.  Preventive measures: Coming up for a physical next month.  Ear pain: Pressure in the right ear and a tapping sort of sound in the left ear, no discharge, no upper respiratory symptoms, no sinus pain or pressure, no nasal discharge.  Ears feel full and tight.  I reviewed the past medical history, family history, social history, surgical history, and allergies today and no changes were needed.  Please see the problem list section below in epic for further details.  Past Medical History: Past Medical History:  Diagnosis Date  . Allergy   . Anxiety   . Breast cancer ([place])   . Cancer [facility]) 2003   papillary thyroid cancer  . Cancer ([place]) 1986   Hodgkins lymphoma  . Carcinoma of thyroid ([place])   . Depression   . Family history of breast cancer   . Family history of colon cancer   . Family history of ovarian cancer   . Family history of prostate cancer   . GERD (gastroesophageal reflux disease)   . History of chicken pox   . Hodgkin's disease   . Hx: UTI (urinary tract infection)   . Increased frequency of headaches   . Left breast mass   . Migraines   . PONV (postoperative nausea and vomiting)   . Primary spontaneous pneumothorax 1990   left  . Pulmonary embolism ([place]) 11/30/2015  . Recurrent spontaneous pneumothorax 1991   left  . Skin cancer 2003   Melanoma  . Thyroid disease    Past Surgical History: Past Surgical History:    Procedure Laterality Date  . CHEST TUBE INSERTION  1990   collasped lung, spontaneous  . FOOT TENDON SURGERY Left   . LAPAROSCOPIC SPLENECTOMY  1987   Hodgkins Disease  . MASTECTOMY     BIL  . THORACOTOMY Left 1991   recurrent spontaneous pneumothorax - Dr Truman Hayward  . THYROIDECTOMY  2004   nodules, carcinoma  . URETEROPLASTY  1977   malformed ureter   Social History: Social History   Socioeconomic History  . Marital status: Married    Spouse name: Not on file  . Number of children: 2  . Years of education: 68  . Highest education level: Not on file  Occupational History  . Occupation: Chief Strategy Officer  Social Needs  . Financial resource strain: Not on file  . Food insecurity:    Worry: Not on file    Inability: Not on file  . Transportation needs:    Medical: Not on file    Non-medical: Not on file  Tobacco Use  . Smoking status: Never Smoker  . Smokeless tobacco: Never Used  Substance and Sexual Activity  . Alcohol use: Yes    Comment: social  . Drug use: No  . Sexual activity: Yes    Birth control/protection: IUD  Lifestyle  . Physical activity:    Days per week: Not on file    Minutes per session: Not on file  .  Stress: Not on file  Relationships  . Social connections:    Talks on phone: Not on file    Gets together: Not on file    Attends religious service: Not on file    Active member of club or organization: Not on file    Attends meetings of clubs or organizations: Not on file    Relationship status: Not on file  Other Topics Concern  . Not on file  Social History Narrative   Regular exercise-no   Family History: Family History  Problem Relation Age of Onset  . Alcohol abuse Maternal Aunt   . Prostate cancer Maternal Uncle 21  . Colon cancer Maternal Grandmother        dx in her 23s  . Prostate cancer Maternal Grandfather        dx in his 59s  . Cancer Other        FH of Breast Cancer, Ovarian/Uterine Cancer  . Heart disease Maternal  Uncle        30s  . Breast cancer Other        MGFs sister - reportedly BRCA+  . Ovarian cancer Other        PGFs mother  . Ovarian cancer Other        PGFs maternal aunt  . Ovarian cancer Other        PGFs maternal grandmohter  . Breast cancer Other        MGMs sister  . Breast cancer Other        MGFs mother   Allergies: Allergies  Allergen Reactions  . Other Rash    "bandaides"  . Macrodantin   . Pertussis Vaccines   . Tape Rash    "plastic tape"   Medications: See med rec.  Review of Systems: No fevers, chills, night sweats, weight loss, chest pain, or shortness of breath.   Objective:    General: Well Developed, well nourished, and in no acute distress.  Neuro: Alert and oriented x3, extra-ocular muscles intact, sensation grossly intact.  HEENT: Normocephalic, atraumatic, pupils equal round reactive to light, neck supple, no masses, no lymphadenopathy, thyroid nonpalpable.  Oropharynx, nasopharynx, ear canals unremarkable. Skin: Warm and dry, no rashes. Cardiac: Regular rate and rhythm, no murmurs rubs or gallops, no lower extremity edema.  Respiratory: Clear to auscultation bilaterally. Not using accessory muscles, speaking in full sentences. Right shoulder: Inspection reveals no abnormalities, atrophy or asymmetry. Palpation is normal with no tenderness over AC joint or bicipital groove. ROM is full in all planes. Rotator cuff strength normal throughout. Positive Neer and Hawkin's tests, empty can. Speeds and Yergason's tests positive. Positive Obrien's, negative crank, negative clunk, and good stability. Normal scapular function observed. No painful arc and no drop arm sign. No apprehension sign  Procedure: Real-time Ultrasound Guided Injection of right glenohumeral joint Device: GE Logiq E  Verbal informed consent obtained.  Time-out conducted.  Noted no overlying erythema, induration, or other signs of local infection.  Skin prepped in a sterile  fashion.  Local anesthesia: Topical Ethyl chloride.  With sterile technique and under real time ultrasound guidance: Using a posterior approach I advanced into the [facility] humeral joint taking care to avoid the labrum, I then injected 1 cc kenalog 40, 2 cc lidocaine, 2 cc bupivacaine. Completed without difficulty  Pain immediately resolved suggesting accurate placement of the medication.  Advised to call if fevers/chills, erythema, induration, drainage, or persistent bleeding.  Images permanently stored and available for review in the ultrasound  unit.  Impression: Technically successful ultrasound guided injection.  Impression and Recommendations:    Rotator cuff syndrome of both shoulders Pain was referrable more to the right glenohumeral joint and biceps tendon today. Glenohumeral injection, the glenohumeral space does communicate with the bicipital sheath. If persistent discomfort we will proceed with MR arthrogram looking for a labral tear.  Neck pain Trapezial and periscapular, likely cervical DDD but with history of pneumothoraces we are going to add a chest x-ray as well.  Eustachian tube dysfunction Completely normal ear exam. Adding Flonase, continue decongestants. ___________________________________________ Gwen Her. Dianah Field, M.D., ABFM., CAQSM. Primary Care and [place] Instructor of [place] [employer] [facility] of Medicine

## 2018-04-14 DIAGNOSIS — Z0000 Encounter for general adult medical examination without abnormal findings: Secondary | ICD-10-CM | POA: Diagnosis not present

## 2018-04-15 ENCOUNTER — Ambulatory Visit (INDEPENDENT_AMBULATORY_CARE_PROVIDER_SITE_OTHER): Payer: BLUE CROSS/BLUE SHIELD | Admitting: Sports Medicine

## 2018-04-15 ENCOUNTER — Encounter: Payer: Self-pay | Admitting: Sports Medicine

## 2018-04-15 VITALS — BP 96/63 | HR 84 | Ht 64.5 in | Wt 162.0 lb

## 2018-04-15 DIAGNOSIS — H6983 Other specified disorders of Eustachian tube, bilateral: Secondary | ICD-10-CM

## 2018-04-15 DIAGNOSIS — F329 Major depressive disorder, single episode, unspecified: Secondary | ICD-10-CM

## 2018-04-15 DIAGNOSIS — M75101 Unspecified rotator cuff tear or rupture of right shoulder, not specified as traumatic: Secondary | ICD-10-CM

## 2018-04-15 DIAGNOSIS — H6993 Unspecified Eustachian tube disorder, bilateral: Secondary | ICD-10-CM

## 2018-04-15 DIAGNOSIS — K219 Gastro-esophageal reflux disease without esophagitis: Secondary | ICD-10-CM | POA: Diagnosis not present

## 2018-04-15 DIAGNOSIS — Z170 Estrogen receptor positive status [ER+]: Secondary | ICD-10-CM

## 2018-04-15 DIAGNOSIS — Z23 Encounter for immunization: Secondary | ICD-10-CM

## 2018-04-15 DIAGNOSIS — C50511 Malignant neoplasm of lower-outer quadrant of right female breast: Secondary | ICD-10-CM

## 2018-04-15 DIAGNOSIS — Z0000 Encounter for general adult medical examination without abnormal findings: Secondary | ICD-10-CM | POA: Diagnosis not present

## 2018-04-15 DIAGNOSIS — F32A Depression, unspecified: Secondary | ICD-10-CM

## 2018-04-15 DIAGNOSIS — M75102 Unspecified rotator cuff tear or rupture of left shoulder, not specified as traumatic: Secondary | ICD-10-CM

## 2018-04-15 DIAGNOSIS — E039 Hypothyroidism, unspecified: Secondary | ICD-10-CM

## 2018-04-15 LAB — COMPREHENSIVE METABOLIC PANEL WITH GFR
Albumin: 4.4 g/dL (ref 3.6–5.1)
BUN: 18 mg/dL (ref 7–25)
Creat: 0.85 mg/dL (ref 0.50–1.10)
Potassium: 3.9 mmol/L (ref 3.5–5.3)
Sodium: 141 mmol/L (ref 135–146)
Total Bilirubin: 0.3 mg/dL (ref 0.2–1.2)

## 2018-04-15 LAB — COMPREHENSIVE METABOLIC PANEL
AG Ratio: 1.6 (calc) (ref 1.0–2.5)
ALT: 29 U/L (ref 6–29)
AST: 21 U/L (ref 10–35)
Alkaline phosphatase (APISO): 108 U/L (ref 33–115)
CO2: 25 mmol/L (ref 20–32)
Calcium: 9.6 mg/dL (ref 8.6–10.2)
Chloride: 104 mmol/L (ref 98–110)
Globulin: 2.7 g/dL (calc) (ref 1.9–3.7)
Glucose, Bld: 92 mg/dL (ref 65–99)
Total Protein: 7.1 g/dL (ref 6.1–8.1)

## 2018-04-15 LAB — LIPID PANEL W/REFLEX DIRECT LDL
Cholesterol: 239 mg/dL — ABNORMAL HIGH (ref <200)
HDL: 70 mg/dL (ref >50)
LDL Cholesterol (Calc): 147 mg/dL — ABNORMAL HIGH
Non-HDL Cholesterol (Calc): 169 mg/dL (calc) — ABNORMAL HIGH (ref <130)
Total CHOL/HDL Ratio: 3.4 (calc) (ref <5.0)
Triglycerides: 106 mg/dL (ref <150)

## 2018-04-15 LAB — CBC
HCT: 40.9 % (ref 35.0–45.0)
Hemoglobin: 13.5 g/dL (ref 11.7–15.5)
MCH: 30.5 pg (ref 27.0–33.0)
MCHC: 33 g/dL (ref 32.0–36.0)
MCV: 92.3 fL (ref 80.0–100.0)
MPV: 10.2 fL (ref 7.5–12.5)
Platelets: 423 Thousand/uL — ABNORMAL HIGH (ref 140–400)
RBC: 4.43 10*6/uL (ref 3.80–5.10)
RDW: 13.2 % (ref 11.0–15.0)
WBC: 5.5 10*3/uL (ref 3.8–10.8)

## 2018-04-15 LAB — HEMOGLOBIN A1C
Hgb A1c MFr Bld: 5.7 % of total Hgb — ABNORMAL HIGH (ref <5.7)
Mean Plasma Glucose: 117 (calc)
eAG (mmol/L): 6.5 (calc)

## 2018-04-15 LAB — HIV ANTIBODY (ROUTINE TESTING W REFLEX): HIV 1&2 Ab, 4th Generation: NONREACTIVE

## 2018-04-15 LAB — VITAMIN D 25 HYDROXY (VIT D DEFICIENCY, FRACTURES): Vit D, 25-Hydroxy: 51 ng/mL (ref 30–100)

## 2018-04-15 LAB — TSH: TSH: 3.66 m[IU]/L

## 2018-04-15 MED ORDER — CETIRIZINE HCL 10 MG PO TABS: 10.0000 mg | ORAL_TABLET | Freq: Every day | ORAL | 3 refills | Status: DC

## 2018-04-15 MED ORDER — BUPROPION HCL ER (XL) 450 MG PO TB24: 450.0000 mg | ORAL_TABLET | Freq: Every day | ORAL | 3 refills | Status: DC

## 2018-04-15 MED ORDER — ZOLMITRIPTAN 5 MG PO TABS: 5.0000 mg | ORAL_TABLET | ORAL | 11 refills | Status: DC | PRN

## 2018-04-15 MED ORDER — VORTIOXETINE HBR 10 MG PO TABS: 10.0000 mg | ORAL_TABLET | Freq: Every day | ORAL | 3 refills | Status: DC

## 2018-04-15 MED ORDER — FLUTICASONE PROPIONATE 50 MCG/ACT NA SUSP: NASAL | 3 refills | Status: DC

## 2018-04-15 MED ORDER — ESOMEPRAZOLE MAGNESIUM 40 MG PO CPDR: 40.0000 mg | DELAYED_RELEASE_CAPSULE | Freq: Every day | ORAL | 3 refills | Status: DC

## 2018-04-15 NOTE — Assessment & Plan Note (Signed)
Did well with a glenohumeral injection 1 month ago. Starting to have some discomfort in the left side, she is going to proceed with getting more diligent with rotator cuff rehabilitation exercises before considering MRI plus/minus repeat injection but on the left.

## 2018-04-15 NOTE — Assessment & Plan Note (Addendum)
Annual physical as above. No cervix, does not need cervical cancer screening. No breast, does not need breast cancer screening. Flu shot today, Twinrix today. Needs to return at 1 month and 6 months for repeat Twinrix

## 2018-04-15 NOTE — Assessment & Plan Note (Signed)
Depression only on Wellbutrin. Has failed multiple antidepressants including Cymbalta, Zoloft. Main complaints are mental fog, forgetfulness.  Anxiety and panic. If insufficient relief we can consider a brain MRI. Adding Trintellix. Return in 1 month.

## 2018-04-15 NOTE — Assessment & Plan Note (Signed)
Does feel somewhat fatigued, currently on 175 mcg of levothyroxine.   Rechecking TSH today.

## 2018-04-15 NOTE — Progress Notes (Signed)
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  Depression: Long-standing, has tried multiple antidepressants including Lexapro, Celexa, Zoloft, Cymbalta without good effect.  She has noted increased fatigue, depressive symptoms, poor concentration and poor nonrestorative sleep.  No suicidal or homicidal ideation.  Currently on Wellbutrin 450.  Hypothyroidism: Recent TSH was higher than it typically runs, she does have some degree of fatigue.  She is consistent with her levothyroxine dosing.  Shoulder pain: History of bilateral impingement syndrome, we have done an injection on the right both subacromial and glenohumeral, she did extremely well with injections, she also notes that when she is consistent with her exercises her pain goes away, agreeable to work a little harder on her exercises before considering further intervention.  I reviewed the past medical history, family history, social history, surgical history, and allergies today and no changes were needed.  Please see the problem list section below in epic for further details.  Past Medical History: Past Medical History:  Diagnosis Date  . Allergy   . Anxiety   . Breast cancer ([place])   . Cancer [facility]) 2003   papillary thyroid cancer  . Cancer ([place]) 1986   Hodgkins lymphoma  . Carcinoma of thyroid ([place])   . Depression   . Family history of breast cancer   . Family history of colon cancer   . Family history of ovarian cancer   . Family history of prostate cancer   . GERD (gastroesophageal reflux disease)   . History of chicken pox   . Hodgkin's disease   . Hx: UTI (urinary tract infection)   . Increased frequency of headaches   . Left breast mass   . Migraines   . PONV (postoperative nausea and vomiting)   . Primary spontaneous pneumothorax 1990   left  . Pulmonary embolism ([place]) 11/30/2015  . Recurrent spontaneous  pneumothorax 1991   left  . Skin cancer 2003   Melanoma  . Thyroid disease    Past Surgical History: Past Surgical History:  Procedure Laterality Date  . CHEST TUBE INSERTION  1990   collasped lung, spontaneous  . FOOT TENDON SURGERY Left   . LAPAROSCOPIC SPLENECTOMY  1987   Hodgkins Disease  . MASTECTOMY     BIL  . THORACOTOMY Left 1991   recurrent spontaneous pneumothorax - Dr Truman Hayward  . THYROIDECTOMY  2004   nodules, carcinoma  . URETEROPLASTY  1977   malformed ureter   Social History: Social History   Socioeconomic History  . Marital status: Married    Spouse name: Not on file  . Number of children: 2  . Years of education: 50  . Highest education level: Not on file  Occupational History  . Occupation: Chief Strategy Officer  Social Needs  . Financial resource strain: Not on file  . Food insecurity:    Worry: Not on file    Inability: Not on file  . Transportation needs:    Medical: Not on file    Non-medical: Not on file  Tobacco Use  . Smoking status: Never Smoker  . Smokeless tobacco: Never Used  Substance and Sexual Activity  . Alcohol use: Yes    Comment: social  . Drug use: No  . Sexual activity: Yes    Birth control/protection: IUD  Lifestyle  . Physical activity:    Days per week: Not on file    Minutes per  session: Not on file  . Stress: Not on file  Relationships  . Social connections:    Talks on phone: Not on file    Gets together: Not on file    Attends religious service: Not on file    Active member of club or organization: Not on file    Attends meetings of clubs or organizations: Not on file    Relationship status: Not on file  Other Topics Concern  . Not on file  Social History Narrative   Regular exercise-no   Family History: Family History  Problem Relation Age of Onset  . Alcohol abuse Maternal Aunt   . Prostate cancer Maternal Uncle 65  . Colon cancer Maternal Grandmother        dx in her 37s  . Prostate cancer Maternal  Grandfather        dx in his 2s  . Cancer Other        FH of Breast Cancer, Ovarian/Uterine Cancer  . Heart disease Maternal Uncle        41s  . Breast cancer Other        MGFs sister - reportedly BRCA+  . Ovarian cancer Other        PGFs mother  . Ovarian cancer Other        PGFs maternal aunt  . Ovarian cancer Other        PGFs maternal grandmohter  . Breast cancer Other        MGMs sister  . Breast cancer Other        MGFs mother   Allergies: Allergies  Allergen Reactions  . Other Rash    "bandaides"  . Macrodantin   . Pertussis Vaccines   . Tape Rash    "plastic tape"   Medications: See med rec.  Review of Systems: No headache, visual changes, nausea, vomiting, diarrhea, constipation, dizziness, abdominal pain, skin rash, fevers, chills, night sweats, swollen lymph nodes, weight loss, chest pain, body aches, joint swelling, muscle aches, shortness of breath, mood changes, visual or auditory hallucinations.  Objective:    General: Well Developed, well nourished, and in no acute distress.  Neuro: Alert and oriented x3, extra-ocular muscles intact, sensation grossly intact. Cranial nerves II through XII are intact, motor, sensory, and coordinative functions are all intact. HEENT: Normocephalic, atraumatic, pupils equal round reactive to light, neck supple, no masses, no lymphadenopathy, thyroid nonpalpable. Oropharynx, nasopharynx, external ear canals are unremarkable. Skin: Warm and dry, no rashes noted.  Cardiac: Regular rate and rhythm, no murmurs rubs or gallops.  Respiratory: Clear to auscultation bilaterally. Not using accessory muscles, speaking in full sentences.  Abdominal: Soft, nontender, nondistended, positive bowel sounds, no masses, no organomegaly.  Musculoskeletal: Shoulder, elbow, wrist, hip, knee, ankle stable, and with full range of motion.  Impression and Recommendations:    The patient was counselled, risk factors were discussed, anticipatory  guidance given.  Annual physical exam Annual physical as above. No cervix, does not need cervical cancer screening. No breast, does not need breast cancer screening. Flu shot today, Twinrix today. Needs to return at 1 month and 6 months for repeat Twinrix  Depression Depression only on Wellbutrin. Has failed multiple antidepressants including Cymbalta, Zoloft. Main complaints are mental fog, forgetfulness.  Anxiety and panic. If insufficient relief we can consider a brain MRI. Adding Trintellix. Return in 1 month.  Hypothyroidism Does feel somewhat fatigued, currently on 175 mcg of levothyroxine.   Rechecking TSH today.  Rotator cuff syndrome of both  shoulders Did well with a glenohumeral injection 1 month ago. Starting to have some discomfort in the left side, she is going to proceed with getting more diligent with rotator cuff rehabilitation exercises before considering MRI plus/minus repeat injection but on the left.  ___________________________________________ Gwen Her. Dianah Field, M.D., ABFM., CAQSM. Primary Care and [place] Instructor of [place] [facility] of Medicine

## 2018-04-20 DIAGNOSIS — R51 Headache: Secondary | ICD-10-CM | POA: Diagnosis not present

## 2018-04-20 DIAGNOSIS — M546 Pain in thoracic spine: Secondary | ICD-10-CM | POA: Diagnosis not present

## 2018-04-20 DIAGNOSIS — M5413 Radiculopathy, cervicothoracic region: Secondary | ICD-10-CM | POA: Diagnosis not present

## 2018-04-20 DIAGNOSIS — M62830 Muscle spasm of back: Secondary | ICD-10-CM | POA: Diagnosis not present

## 2018-05-11 DIAGNOSIS — M546 Pain in thoracic spine: Secondary | ICD-10-CM | POA: Diagnosis not present

## 2018-05-11 DIAGNOSIS — R51 Headache: Secondary | ICD-10-CM | POA: Diagnosis not present

## 2018-05-11 DIAGNOSIS — M5413 Radiculopathy, cervicothoracic region: Secondary | ICD-10-CM | POA: Diagnosis not present

## 2018-05-11 DIAGNOSIS — M62830 Muscle spasm of back: Secondary | ICD-10-CM | POA: Diagnosis not present

## 2018-05-13 ENCOUNTER — Ambulatory Visit: Payer: BLUE CROSS/BLUE SHIELD | Admitting: Sports Medicine

## 2018-05-13 DIAGNOSIS — R51 Headache: Secondary | ICD-10-CM | POA: Diagnosis not present

## 2018-05-13 DIAGNOSIS — M62830 Muscle spasm of back: Secondary | ICD-10-CM | POA: Diagnosis not present

## 2018-05-13 DIAGNOSIS — M546 Pain in thoracic spine: Secondary | ICD-10-CM | POA: Diagnosis not present

## 2018-05-13 DIAGNOSIS — M5413 Radiculopathy, cervicothoracic region: Secondary | ICD-10-CM | POA: Diagnosis not present

## 2018-05-25 DIAGNOSIS — E039 Hypothyroidism, unspecified: Secondary | ICD-10-CM | POA: Diagnosis not present

## 2018-05-25 LAB — T4, FREE: Free T4: 1.5 ng/dL (ref 0.8–1.8)

## 2018-05-25 LAB — T3, FREE: T3, Free: 2.8 pg/mL (ref 2.3–4.2)

## 2018-05-25 LAB — TSH: TSH: 0.61 mIU/L

## 2018-05-26 ENCOUNTER — Encounter: Payer: Self-pay | Admitting: Sports Medicine

## 2018-05-26 ENCOUNTER — Ambulatory Visit: Payer: BLUE CROSS/BLUE SHIELD | Admitting: Sports Medicine

## 2018-05-28 ENCOUNTER — Encounter: Payer: Self-pay | Admitting: Sports Medicine

## 2018-05-28 ENCOUNTER — Ambulatory Visit (INDEPENDENT_AMBULATORY_CARE_PROVIDER_SITE_OTHER): Payer: BLUE CROSS/BLUE SHIELD | Admitting: Sports Medicine

## 2018-05-28 DIAGNOSIS — M75102 Unspecified rotator cuff tear or rupture of left shoulder, not specified as traumatic: Secondary | ICD-10-CM

## 2018-05-28 DIAGNOSIS — F419 Anxiety disorder, unspecified: Secondary | ICD-10-CM | POA: Diagnosis not present

## 2018-05-28 DIAGNOSIS — M75101 Unspecified rotator cuff tear or rupture of right shoulder, not specified as traumatic: Secondary | ICD-10-CM

## 2018-05-28 DIAGNOSIS — E039 Hypothyroidism, unspecified: Secondary | ICD-10-CM | POA: Diagnosis not present

## 2018-05-28 MED ORDER — LEVOTHYROXINE SODIUM 175 MCG PO TABS: 175.0000 ug | ORAL_TABLET | Freq: Every day | ORAL | 3 refills | Status: DC

## 2018-05-28 NOTE — Assessment & Plan Note (Signed)
All TFTs are normal on 175 of levothyroxine, she does desire a written prescription.

## 2018-05-28 NOTE — Assessment & Plan Note (Signed)
Patient does have anxiety and depression only on Wellbutrin. Had failed Cymbalta, Zoloft. Lots of mental fog, forgetfulness, anxiety and panic. We added Trintellix at the last visit but she has not yet picked it up, she will see me back after a month of Trintellix. We can certainly consider a brain MRI if insufficient relief with max dose Trintellix.

## 2018-05-28 NOTE — Assessment & Plan Note (Signed)
Did well after a left glenohumeral injection 2 months ago. She was having some discomfort but has been more diligent with her exercises and pain has now resolved.

## 2018-05-28 NOTE — Progress Notes (Signed)
Subjective:    CC: Follow-up  HPI: Hypothyroidism: Well-controlled.  Shoulder arthritis: Well-controlled now that she has done her rehab exercises.  Anxiety and depression: Has not yet started Trintellix.  No suicidal or homicidal ideation.  I reviewed the past medical history, family history, social history, surgical history, and allergies today and no changes were needed.  Please see the problem list section below in epic for further details.  Past Medical History: Past Medical History:  Diagnosis Date  . Allergy   . Anxiety   . Breast cancer ([place])   . Cancer [facility]) 2003   papillary thyroid cancer  . Cancer ([place]) 1986   Hodgkins lymphoma  . Carcinoma of thyroid ([place])   . Depression   . Family history of breast cancer   . Family history of colon cancer   . Family history of ovarian cancer   . Family history of prostate cancer   . GERD (gastroesophageal reflux disease)   . History of chicken pox   . Hodgkin's disease   . Hx: UTI (urinary tract infection)   . Increased frequency of headaches   . Left breast mass   . Migraines   . PONV (postoperative nausea and vomiting)   . Primary spontaneous pneumothorax 1990   left  . Pulmonary embolism ([place]) 11/30/2015  . Recurrent spontaneous pneumothorax 1991   left  . Skin cancer 2003   Melanoma  . Thyroid disease    Past Surgical History: Past Surgical History:  Procedure Laterality Date  . CHEST TUBE INSERTION  1990   collasped lung, spontaneous  . FOOT TENDON SURGERY Left   . LAPAROSCOPIC SPLENECTOMY  1987   Hodgkins Disease  . MASTECTOMY     BIL  . THORACOTOMY Left 1991   recurrent spontaneous pneumothorax - Dr Truman Hayward  . THYROIDECTOMY  2004   nodules, carcinoma  . URETEROPLASTY  1977   malformed ureter   Social History: Social History   Socioeconomic History  . Marital status: Married    Spouse name: Not on file  . Number of children: 2  . Years of education: 13  . Highest education level: Not on file    Occupational History  . Occupation: Chief Strategy Officer  Social Needs  . Financial resource strain: Not on file  . Food insecurity:    Worry: Not on file    Inability: Not on file  . Transportation needs:    Medical: Not on file    Non-medical: Not on file  Tobacco Use  . Smoking status: Never Smoker  . Smokeless tobacco: Never Used  Substance and Sexual Activity  . Alcohol use: Yes    Comment: social  . Drug use: No  . Sexual activity: Yes    Birth control/protection: IUD  Lifestyle  . Physical activity:    Days per week: Not on file    Minutes per session: Not on file  . Stress: Not on file  Relationships  . Social connections:    Talks on phone: Not on file    Gets together: Not on file    Attends religious service: Not on file    Active member of club or organization: Not on file    Attends meetings of clubs or organizations: Not on file    Relationship status: Not on file  Other Topics Concern  . Not on file  Social History Narrative   Regular exercise-no   Family History: Family History  Problem Relation Age of Onset  . Alcohol abuse Maternal  Aunt   . Prostate cancer Maternal Uncle 55  . Colon cancer Maternal Grandmother        dx in her 90s  . Prostate cancer Maternal Grandfather        dx in his 68s  . Cancer Other        FH of Breast Cancer, Ovarian/Uterine Cancer  . Heart disease Maternal Uncle        30s  . Breast cancer Other        MGFs sister - reportedly BRCA+  . Ovarian cancer Other        PGFs mother  . Ovarian cancer Other        PGFs maternal aunt  . Ovarian cancer Other        PGFs maternal grandmohter  . Breast cancer Other        MGMs sister  . Breast cancer Other        MGFs mother   Allergies: Allergies  Allergen Reactions  . Other Rash    "bandaides"  . Macrodantin   . Pertussis Vaccines   . Tape Rash    "plastic tape"   Medications: See med rec.  Review of Systems: No fevers, chills, night sweats, weight loss,  chest pain, or shortness of breath.   Objective:    General: Well Developed, well nourished, and in no acute distress.  Neuro: Alert and oriented x3, extra-ocular muscles intact, sensation grossly intact.  HEENT: Normocephalic, atraumatic, pupils equal round reactive to light, neck supple, no masses, no lymphadenopathy, thyroid nonpalpable.  Skin: Warm and dry, no rashes. Cardiac: Regular rate and rhythm, no murmurs rubs or gallops, no lower extremity edema.  Respiratory: Clear to auscultation bilaterally. Not using accessory muscles, speaking in full sentences.  Impression and Recommendations:    Anxiety Patient does have anxiety and depression only on Wellbutrin. Had failed Cymbalta, Zoloft. Lots of mental fog, forgetfulness, anxiety and panic. We added Trintellix at the last visit but she has not yet picked it up, she will see me back after a month of Trintellix. We can certainly consider a brain MRI if insufficient relief with max dose Trintellix.  Hypothyroidism All TFTs are normal on 175 of levothyroxine, she does desire a written prescription.  Rotator cuff syndrome of both shoulders Did well after a left glenohumeral injection 2 months ago. She was having some discomfort but has been more diligent with her exercises and pain has now resolved. ___________________________________________ Gwen Her. Dianah Field, M.D., ABFM., CAQSM. Primary Care and Sports Medicine [facility]  Adjunct Professor of [place] [employer] [facility] of Medicine

## 2018-06-25 ENCOUNTER — Ambulatory Visit (INDEPENDENT_AMBULATORY_CARE_PROVIDER_SITE_OTHER): Payer: BLUE CROSS/BLUE SHIELD | Admitting: Sports Medicine

## 2018-06-25 DIAGNOSIS — R413 Other amnesia: Secondary | ICD-10-CM | POA: Diagnosis not present

## 2018-06-25 DIAGNOSIS — R400 Somnolence: Secondary | ICD-10-CM | POA: Insufficient documentation

## 2018-06-25 DIAGNOSIS — F419 Anxiety disorder, unspecified: Secondary | ICD-10-CM

## 2018-06-25 DIAGNOSIS — F329 Major depressive disorder, single episode, unspecified: Secondary | ICD-10-CM | POA: Diagnosis not present

## 2018-06-25 DIAGNOSIS — G3109 Other frontotemporal dementia: Secondary | ICD-10-CM

## 2018-06-25 DIAGNOSIS — F32A Depression, unspecified: Secondary | ICD-10-CM

## 2018-06-25 MED ORDER — VORTIOXETINE HBR 20 MG PO TABS: 20.0000 mg | ORAL_TABLET | Freq: Every day | ORAL | 11 refills | Status: DC

## 2018-06-25 NOTE — Progress Notes (Signed)
Subjective:    CC: Follow-up  HPI: Anxiety and depression: About the same on 10 of Trintellix, no adverse effects.  Poor memory: Feels not as sharp as she used to be, there certainly is a seasonal component as this has happened last year about the same time.  She is agreeable to go up on Trintellix, however she does have a strong history of multiple cancers, in the high stages, so due to her focal neurologic symptom of progressive memory loss she is agreeable to proceed with a brain MRI sooner rather than later.  I reviewed the past medical history, family history, social history, surgical history, and allergies today and no changes were needed.  Please see the problem list section below in epic for further details.  Past Medical History: Past Medical History:  Diagnosis Date  . Allergy   . Anxiety   . Breast cancer ([place])   . 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 Cancer ([place]) 1986   Hodgkins lymphoma  . Carcinoma of thyroid ([place])   . Depression   . Family history of breast cancer   . Family history of colon cancer   . Family history of ovarian cancer   . Family history of prostate cancer   . GERD (gastroesophageal reflux disease)   . History of chicken pox   . Hodgkin's disease   . Hx: UTI (urinary tract infection)   . Increased frequency of headaches   . Left breast mass   . Migraines   . PONV (postoperative nausea and vomiting)   . Primary spontaneous pneumothorax 1990   left  . Pulmonary embolism ([place]) 11/30/2015  . Recurrent spontaneous pneumothorax 1991   left  . Skin cancer 2003   Melanoma  . Thyroid disease    Past Surgical History: Past Surgical History:  Procedure Laterality Date  . CHEST TUBE INSERTION  1990   collasped lung, spontaneous  . FOOT TENDON SURGERY Left   . LAPAROSCOPIC SPLENECTOMY  1987   Hodgkins Disease  . MASTECTOMY     BIL  . THORACOTOMY Left 1991   recurrent spontaneous pneumothorax - Dr Truman Hayward  . THYROIDECTOMY  2004   nodules, carcinoma  . URETEROPLASTY  1977   malformed ureter   Social History: Social History   Socioeconomic History  . Marital status: Married    Spouse name: Not on file  . Number of children: 2  . Years of education: 58  . Highest education level: Not on file  Occupational History  . Occupation: Chief Strategy Officer  Social Needs  . Financial resource strain: Not on file  . Food insecurity:    Worry: Not on file    Inability: Not on file  . Transportation needs:    Medical: Not on file    Non-medical: Not on file  Tobacco Use  . Smoking status: Never Smoker  . Smokeless tobacco: Never Used  Substance and Sexual Activity  . Alcohol use: Yes    Comment: social  . Drug use: No  . Sexual activity: Yes    Birth control/protection: I.U.D.  Lifestyle  . Physical activity:    Days per week: Not on file    Minutes per session: Not on file  . Stress: Not on file  Relationships  . Social connections:    Talks on phone: Not on file    Gets together: Not on file    Attends religious service: Not on file    Active member of club or organization:  Not on file    Attends meetings of clubs or organizations: Not on file    Relationship status: Not on file  Other Topics Concern  . Not on file  Social History Narrative   Regular exercise-no   Family History: Family History  Problem Relation Age of Onset  . Alcohol abuse Maternal Aunt   . Prostate cancer Maternal Uncle 5  . Colon cancer Maternal Grandmother        dx in her 75s  . Prostate cancer Maternal Grandfather        dx in his 77s  . Cancer Other        FH of Breast Cancer, Ovarian/Uterine Cancer  . Heart disease Maternal Uncle        44s  . Breast cancer Other        MGFs sister - reportedly BRCA+  . Ovarian cancer Other        PGFs mother  . Ovarian cancer Other        PGFs maternal aunt  . Ovarian cancer Other        PGFs maternal grandmohter  . Breast cancer Other        MGMs sister  . Breast cancer  Other        MGFs mother   Allergies: Allergies  Allergen Reactions  . Other Rash    "bandaides"  . Macrodantin   . Pertussis Vaccines   . Tape Rash    "plastic tape"   Medications: See med rec.  Review of Systems: No fevers, chills, night sweats, weight loss, chest pain, or shortness of breath.   Objective:    General: Well Developed, well nourished, and in no acute distress.  Neuro: Alert and oriented x3, extra-ocular muscles intact, sensation grossly intact.  HEENT: Normocephalic, atraumatic, pupils equal round reactive to light, neck supple, no masses, no lymphadenopathy, thyroid nonpalpable.  Skin: Warm and dry, no rashes. Cardiac: Regular rate and rhythm, no murmurs rubs or gallops, no lower extremity edema.  Respiratory: Clear to auscultation bilaterally. Not using accessory muscles, speaking in full sentences.  Impression and Recommendations:    Memory loss Unclear etiology, does have a history of breast cancer, lymphoma, pelvic cancer. Worsening focus and memory. Though this may be related to uncontrolled anxiety depression or undiagnosed ADD we are going to need a brain MRI. If insufficient improvement with the increase to 20 mg of Trintellix and with a normal brain MRI we would do referrals to neurology for her memory loss and to psychology for adult ADD testing.  Anxiety Continue current dosing of Wellbutrin. Failed Cymbalta, Zoloft, increasing Trintellix to 20 mg. Anxiety, depression is better but her memory and focus is not. Return to see me in 1 month for PHQ and GAD. ___________________________________________ Gwen Her. Dianah Field, M.D., ABFM., CAQSM. Primary Care and Sports Medicine [facility]  Adjunct Professor of [place] [employer] [facility] [employer] of Medicine

## 2018-06-25 NOTE — Assessment & Plan Note (Signed)
Continue current dosing of Wellbutrin. Failed Cymbalta, Zoloft, increasing Trintellix to 20 mg. Anxiety, depression is better but her memory and focus is not. Return to see me in 1 month for PHQ and GAD.

## 2018-06-25 NOTE — Assessment & Plan Note (Addendum)
Unclear etiology, does have a history of breast cancer, lymphoma, pelvic cancer. Worsening focus and memory. Though this may be related to uncontrolled anxiety depression or undiagnosed ADD we are going to need a brain MRI. If insufficient improvement with the increase to 20 mg of Trintellix and with a normal brain MRI we would do referrals to neurology for her memory loss and to psychology for adult ADD testing.

## 2018-06-28 ENCOUNTER — Ambulatory Visit (INDEPENDENT_AMBULATORY_CARE_PROVIDER_SITE_OTHER): Payer: BLUE CROSS/BLUE SHIELD

## 2018-06-28 DIAGNOSIS — G3109 Other frontotemporal dementia: Secondary | ICD-10-CM

## 2018-06-28 DIAGNOSIS — R413 Other amnesia: Secondary | ICD-10-CM

## 2018-06-28 MED ORDER — GADOBENATE DIMEGLUMINE 529 MG/ML IV SOLN
15.0000 mL | Freq: Once | INTRAVENOUS | Status: AC | PRN
  Administered 2018-06-28: 15 mL via INTRAVENOUS

## 2018-06-29 ENCOUNTER — Telehealth: Payer: Self-pay | Admitting: Sports Medicine

## 2018-06-29 DIAGNOSIS — F419 Anxiety disorder, unspecified: Secondary | ICD-10-CM

## 2018-06-29 MED ORDER — VILAZODONE HCL 10 & 20 MG PO KIT: 1.0000 | PACK | Freq: Every day | ORAL | 0 refills | Status: DC

## 2018-06-29 NOTE — Telephone Encounter (Signed)
Excellent!  Sigh...  Switching to Viibryd.  Please let Toshiye know.

## 2018-06-29 NOTE — Telephone Encounter (Signed)
Left brief message for patient with medication changes for patient. Patient was asked to call back with any questions.

## 2018-06-29 NOTE — Telephone Encounter (Signed)
I started working on this PA for Trintellix and this is a plan exclusion. This means its not covered under this plan and does not give me an option to appeal. She will have to try another medication or pay out of pocket. Please advise.

## 2018-06-30 NOTE — Telephone Encounter (Signed)
Left detailed message for patient that Dr. Darene Lamer recommends calling her insurance since Stacy Chung is a plan exclusion. Patient was instructed to call her insurance and let us know the preferred product. She was asked to call our office with any questions.

## 2018-07-02 ENCOUNTER — Ambulatory Visit (INDEPENDENT_AMBULATORY_CARE_PROVIDER_SITE_OTHER): Payer: BLUE CROSS/BLUE SHIELD

## 2018-07-02 ENCOUNTER — Ambulatory Visit (INDEPENDENT_AMBULATORY_CARE_PROVIDER_SITE_OTHER): Payer: BLUE CROSS/BLUE SHIELD | Admitting: Family Medicine

## 2018-07-02 ENCOUNTER — Encounter: Payer: Self-pay | Admitting: Family Medicine

## 2018-07-02 VITALS — BP 123/73 | HR 96 | Ht 65.0 in | Wt 162.0 lb

## 2018-07-02 DIAGNOSIS — M79674 Pain in right toe(s): Secondary | ICD-10-CM

## 2018-07-02 MED ORDER — PREDNISONE 10 MG PO TABS: 30.0000 mg | ORAL_TABLET | Freq: Every day | ORAL | 0 refills | Status: DC

## 2018-07-02 MED ORDER — DICLOFENAC SODIUM 1 % TD GEL: 2.0000 g | Freq: Four times a day (QID) | TRANSDERMAL | 11 refills | Status: DC

## 2018-07-02 NOTE — Progress Notes (Signed)
Stacy Chung is a 47 y.o. female who presents to [facility] today for right great toe pain.  Stacy Chung has a about 1 week history of pain in the right great toe at the interphalangeal joint.  She denies any injury.  She notes pain is present at the plantar aspect of the toe especially with walking.  She denies any radiating pain weakness or numbness fevers or chills.  She is tried some over-the-counter medications which developed a bit.  She denies any fevers or chills vomiting or diarrhea.  She has a pertinent medical history for breast cancer in remission currently.  Additionally she has a pertinent history for PE thought to be due to tamoxifen.  She has had some pain in this toe before.  She was seen in 2015 for this issue.  However her pain was more in the MTP.  Fluid aspiration was negative for gout crystals.  She is leaving for vacation at [facility] today and wants to make sure that she does not have a ton of pain while she is on vacation.    ROS:  As above  Exam:  BP 123/73   Pulse 96   Ht 5' 5" (1.651 m)   Wt 162 lb (73.5 kg)   LMP 02/29/2016   BMI 26.96 kg/m  General: Well Developed, well nourished, and in no acute distress.  Neuro/Psych: Alert and oriented x3, extra-ocular muscles intact, able to move all 4 extremities, sensation grossly intact. Skin: Warm and dry, no rashes noted.  Respiratory: Not using accessory muscles, speaking in full sentences, trachea midline.  Cardiovascular: Pulses palpable, no extremity edema. Abdomen: Does not appear distended. MSK: Right foot relatively normal-appearing without erythema or swelling. Nontender throughout except for mild tenderness palpation plantar aspect of first toe interphalangeal joint. Normal flexion of toe.  Extension slightly limited by pain. Pulses cap refill and sensation are intact in the foot.    Lab and Radiology Results X-ray right great toe personally independently  reviewed No acute fractures.  Accessory ossicle plantar first interphalangeal joint. Await formal radiology review  Limited musculoskeletal ultrasound right toe: MTP joint with no significant effusion or tenderness to palpation with ultrasound probe. Dorsal interphalangeal joint no large effusion. Plantar interphalangeal joint: Intact structures.  No large effusion.  Tender palpation at the plantar joint structure as the flexor tendon crosses the IP joint overlying accessory ossicle.. No fractures visible.     Assessment and Plan: 47 y.o. female with tender palpation left first toe interphalangeal joint.  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  Plan for padding diclofenac gel and relative rest.  If not better patient can fill a printed backup prednisone prescription while a vacation.  She may return to clinic for injection if needed.    Orders Placed This Encounter  Procedures  . DG Toe Great Right    Order Specific Question:   Reason for exam:    Answer:   pain plantar mtp    Order Specific Question:   Is the patient pregnant?    Answer:   No    Order Specific Question:   Preferred imaging location?    Answer:   [facility]   No orders of the defined types were placed in this encounter.   Historical information moved to improve visibility of documentation.  Past Medical History:  Diagnosis Date  . Allergy   . Anxiety   . Breast cancer ([place])   . Cancer (  [place]) 2003   papillary thyroid cancer  . Cancer ([place]) 1986   Hodgkins lymphoma  . Carcinoma of thyroid ([place])   . Depression   . Family history of breast cancer   . Family history of colon cancer   . Family history of ovarian cancer   . Family history of prostate cancer   . GERD (gastroesophageal reflux disease)   . History of chicken pox   . Hodgkin's disease   . Hx: UTI (urinary tract infection)   . Increased frequency of headaches   . Left breast mass   .  Migraines   . PONV (postoperative nausea and vomiting)   . Primary spontaneous pneumothorax 1990   left  . Pulmonary embolism ([place]) 11/30/2015  . Recurrent spontaneous pneumothorax 1991   left  . Skin cancer 2003   Melanoma  . Thyroid disease    Past Surgical History:  Procedure Laterality Date  . CHEST TUBE INSERTION  1990   collasped lung, spontaneous  . FOOT TENDON SURGERY Left   . LAPAROSCOPIC SPLENECTOMY  1987   Hodgkins Disease  . MASTECTOMY     BIL  . THORACOTOMY Left 1991   recurrent spontaneous pneumothorax - Dr Truman Hayward  . THYROIDECTOMY  2004   nodules, carcinoma  . URETEROPLASTY  1977   malformed ureter   Social History   Tobacco Use  . Smoking status: Never Smoker  . Smokeless tobacco: Never Used  Substance Use Topics  . Alcohol use: Yes    Comment: social   family history includes Alcohol abuse in her maternal aunt; Breast cancer in some other family members; Cancer in an other family member; Colon cancer in her maternal grandmother; Heart disease in her maternal uncle; Ovarian cancer in some other family members; Prostate cancer in her maternal grandfather; Prostate cancer (age of onset: 5) in her maternal uncle.  Medications: Current Outpatient Medications  Medication Sig Dispense Refill  . AMBULATORY NON FORMULARY MEDICATION Myofascial release therapy and massage therapy 2-3 times a week as needed 1 each 0  . anastrozole (ARIMIDEX) 1 MG tablet TAKE 1 TABLET(1 MG) BY MOUTH DAILY 90 tablet 3  . Biotin 2500 MCG CAPS Take 5,000 mcg by mouth daily. 30 capsule   . buPROPion HCl ER, XL, 450 MG TB24 Take 450 mg by mouth daily. 90 tablet 3  . CALCIUM PO Take 1,200 mg by mouth daily.     . cetirizine (ZYRTEC) 10 MG tablet Take 1 tablet (10 mg total) by mouth daily. 90 tablet 3  . Cholecalciferol (VITAMIN D3) 10000 UNITS capsule Take 10,000 Units by mouth daily. Only 5 days a week     . COLLAGEN PO Take by mouth daily.    Marland Kitchen esomeprazole (NEXIUM) 40 MG capsule Take 1  capsule (40 mg total) by mouth daily at 12 noon. 90 capsule 3  . fexofenadine-pseudoephedrine (ALLEGRA-D 24) 180-240 MG 24 hr tablet Take 1 tablet by mouth daily.    . fluticasone (FLONASE) 50 MCG/ACT nasal spray One spray in each nostril twice a day, use left hand for right nostril, and right hand for left nostril. 48 g 3  . levothyroxine (SYNTHROID, LEVOTHROID) 175 MCG tablet Take 1 tablet (175 mcg total) by mouth daily. 90 tablet 3  . naproxen sodium (ALEVE) 220 MG tablet Take 220 mg by mouth 2 (two) times daily as needed (pain).    . Prasterone (INTRAROSA) 6.5 MG INST Place 1 application vaginally daily. 30 each 2  . tetrahydrozoline 0.05 % ophthalmic solution  Place 1 drop into both eyes as needed (dry eyes).    . Turmeric (CURCUMIN 95 PO) Take 65 mg by mouth daily.    . Vilazodone HCl (VIIBRYD STARTER PACK) 10 & 20 MG KIT Take 1 tablet by mouth daily. Use as directed, 4 weeks QS 1 kit 0  . zolmitriptan (ZOMIG) 5 MG tablet Take 1 tablet (5 mg total) by mouth as needed. 10 tablet 11   No current facility-administered medications for this visit.    Allergies  Allergen Reactions  . Other Rash    "bandaides"  . Macrodantin   . Pertussis Vaccines   . Tape Rash    "plastic tape"      Discussed warning signs or symptoms. Please see discharge instructions. Patient expresses understanding.

## 2018-07-02 NOTE — Patient Instructions (Signed)
Thank you for coming in today. Do a pressure reducing cut out of an old insole in your shoes.  Apply the diclofenac gel up to 4x daily for pain as needed   Fill and take prednisone if not better.   You can also use a dancer pad from [email or web address]  Recheck as needed.

## 2018-07-21 ENCOUNTER — Ambulatory Visit (INDEPENDENT_AMBULATORY_CARE_PROVIDER_SITE_OTHER): Payer: BLUE CROSS/BLUE SHIELD | Admitting: Sports Medicine

## 2018-07-21 ENCOUNTER — Encounter: Payer: Self-pay | Admitting: Sports Medicine

## 2018-07-21 DIAGNOSIS — M75101 Unspecified rotator cuff tear or rupture of right shoulder, not specified as traumatic: Secondary | ICD-10-CM | POA: Diagnosis not present

## 2018-07-21 DIAGNOSIS — M75102 Unspecified rotator cuff tear or rupture of left shoulder, not specified as traumatic: Secondary | ICD-10-CM | POA: Diagnosis not present

## 2018-07-21 NOTE — Assessment & Plan Note (Signed)
Glenohumeral injection was back in September, right side has not been injected in some time, right glenohumeral injection today. Return as needed.

## 2018-07-21 NOTE — Progress Notes (Signed)
Subjective:    CC: Right shoulder pain  HPI: This is a pleasant 48 year old female with rotator cuff arthropathy, bilateral.  She had a left glenohumeral injection about 3 months ago and did very well, her right shoulder has not been injected in a long time.  Now having recurrence of pain, right-sided, anteriorly at the joint line worse with abduction and external rotation.  No mechanical symptoms, no trauma.  I reviewed the past medical history, family history, social history, surgical history, and allergies today and no changes were needed.  Please see the problem list section below in epic for further details.  Past Medical History: Past Medical History:  Diagnosis Date  . Allergy   . Anxiety   . Breast cancer ([place])   . Cancer [facility]) 2003   papillary thyroid cancer  . Cancer ([place]) 1986   Hodgkins lymphoma  . Carcinoma of thyroid ([place])   . Depression   . Family history of breast cancer   . Family history of colon cancer   . Family history of ovarian cancer   . Family history of prostate cancer   . GERD (gastroesophageal reflux disease)   . History of chicken pox   . Hodgkin's disease   . Hx: UTI (urinary tract infection)   . Increased frequency of headaches   . Left breast mass   . Migraines   . PONV (postoperative nausea and vomiting)   . Primary spontaneous pneumothorax 1990   left  . Pulmonary embolism ([place]) 11/30/2015  . Recurrent spontaneous pneumothorax 1991   left  . Skin cancer 2003   Melanoma  . Thyroid disease    Past Surgical History: Past Surgical History:  Procedure Laterality Date  . CHEST TUBE INSERTION  1990   collasped lung, spontaneous  . FOOT TENDON SURGERY Left   . LAPAROSCOPIC SPLENECTOMY  1987   Hodgkins Disease  . MASTECTOMY     BIL  . THORACOTOMY Left 1991   recurrent spontaneous pneumothorax - Dr Truman Hayward  . THYROIDECTOMY  2004   nodules, carcinoma  . URETEROPLASTY  1977   malformed ureter   Social History: Social History    Socioeconomic History  . Marital status: Married    Spouse name: Not on file  . Number of children: 2  . Years of education: 48  . Highest education level: Not on file  Occupational History  . Occupation: Chief Strategy Officer  Social Needs  . Financial resource strain: Not on file  . Food insecurity:    Worry: Not on file    Inability: Not on file  . Transportation needs:    Medical: Not on file    Non-medical: Not on file  Tobacco Use  . Smoking status: Never Smoker  . Smokeless tobacco: Never Used  Substance and Sexual Activity  . Alcohol use: Yes    Comment: social  . Drug use: No  . Sexual activity: Yes    Birth control/protection: I.U.D.  Lifestyle  . Physical activity:    Days per week: Not on file    Minutes per session: Not on file  . Stress: Not on file  Relationships  . Social connections:    Talks on phone: Not on file    Gets together: Not on file    Attends religious service: Not on file    Active member of club or organization: Not on file    Attends meetings of clubs or organizations: Not on file    Relationship status: Not on file  Other Topics Concern  . Not on file  Social History Narrative   Regular exercise-no   Family History: Family History  Problem Relation Age of Onset  . Alcohol abuse Maternal Aunt   . Prostate cancer Maternal Uncle 1  . Colon cancer Maternal Grandmother        dx in her 62s  . Prostate cancer Maternal Grandfather        dx in his 12s  . Cancer Other        FH of Breast Cancer, Ovarian/Uterine Cancer  . Heart disease Maternal Uncle        39s  . Breast cancer Other        MGFs sister - reportedly BRCA+  . Ovarian cancer Other        PGFs mother  . Ovarian cancer Other        PGFs maternal aunt  . Ovarian cancer Other        PGFs maternal grandmohter  . Breast cancer Other        MGMs sister  . Breast cancer Other        MGFs mother   Allergies: Allergies  Allergen Reactions  . Other Rash     "bandaides"  . Macrodantin   . Pertussis Vaccines   . Tape Rash    "plastic tape"   Medications: See med rec.  Review of Systems: No fevers, chills, night sweats, weight loss, chest pain, or shortness of breath.   Objective:    General: Well Developed, well nourished, and in no acute distress.  Neuro: Alert and oriented x3, extra-ocular muscles intact, sensation grossly intact.  HEENT: Normocephalic, atraumatic, pupils equal round reactive to light, neck supple, no masses, no lymphadenopathy, thyroid nonpalpable.  Skin: Warm and dry, no rashes. Cardiac: Regular rate and rhythm, no murmurs rubs or gallops, no lower extremity edema.  Respiratory: Clear to auscultation bilaterally. Not using accessory muscles, speaking in full sentences. Right shoulder: Inspection reveals no abnormalities, atrophy or asymmetry. Palpation is normal with no tenderness over AC joint or bicipital groove. ROM is full in all planes. Rotator cuff strength normal throughout. No signs of impingement with negative Neer and Hawkin's tests, empty can. Speeds and Yergason's tests normal. Positive Obrien's, negative crank, negative clunk, and good stability. Normal scapular function observed. No painful arc and no drop arm sign. No apprehension sign  Procedure: Real-time Ultrasound Guided Injection of right glenohumeral joint Device: GE Logiq E  Verbal informed consent obtained.  Time-out conducted.  Noted no overlying erythema, induration, or other signs of local infection.  Skin prepped in a sterile fashion.  Local anesthesia: Topical Ethyl chloride.  With sterile technique and under real time ultrasound guidance: Using a 22-gauge spinal needle advanced into the glenohumeral joint taking care to avoid the labrum, injected 1 cc Kenalog 40, 2 cc lidocaine, 2 cc bupivacaine. Completed without difficulty  Pain immediately resolved suggesting accurate placement of the medication.  Advised to call if  fevers/chills, erythema, induration, drainage, or persistent bleeding.  Images permanently stored and available for review in the ultrasound unit.  Impression: Technically successful ultrasound guided injection.  Impression and Recommendations:    Rotator cuff syndrome of both shoulders Glenohumeral injection was back in September, right side has not been injected in some time, right glenohumeral injection today. Return as needed. ___________________________________________ Gwen Her. Dianah Field, M.D., ABFM., CAQSM. Primary Care and Sports Medicine [facility]  Adjunct Professor of [place] of [facility] [employer] of  Medicine

## 2018-07-23 ENCOUNTER — Ambulatory Visit: Payer: BLUE CROSS/BLUE SHIELD | Admitting: Sports Medicine

## 2018-08-05 ENCOUNTER — Telehealth: Payer: Self-pay | Admitting: Sports Medicine

## 2018-08-05 NOTE — Telephone Encounter (Addendum)
Received a VM from the patient that her Trintellix was required a PA and per the last response from insurance the medication was excluded from the plan. Patient was asked to call back with any questions. I spoke with Marya Amsler at [facility] and let him know it was a plan exclusion and he stated that he would make a note.

## 2018-08-13 ENCOUNTER — Other Ambulatory Visit: Payer: Self-pay

## 2018-08-13 DIAGNOSIS — C50511 Malignant neoplasm of lower-outer quadrant of right female breast: Secondary | ICD-10-CM

## 2018-08-13 DIAGNOSIS — Z170 Estrogen receptor positive status [ER+]: Principal | ICD-10-CM

## 2018-08-13 MED ORDER — ANASTROZOLE 1 MG PO TABS: ORAL_TABLET | ORAL | 0 refills | Status: DC

## 2018-08-18 ENCOUNTER — Other Ambulatory Visit: Payer: Self-pay

## 2018-08-18 DIAGNOSIS — Z170 Estrogen receptor positive status [ER+]: Principal | ICD-10-CM

## 2018-08-18 DIAGNOSIS — C50511 Malignant neoplasm of lower-outer quadrant of right female breast: Secondary | ICD-10-CM

## 2018-08-18 MED ORDER — ANASTROZOLE 1 MG PO TABS: ORAL_TABLET | ORAL | 0 refills | Status: DC

## 2018-08-20 ENCOUNTER — Ambulatory Visit: Payer: BLUE CROSS/BLUE SHIELD | Admitting: Sports Medicine

## 2018-09-09 DIAGNOSIS — Z0000 Encounter for general adult medical examination without abnormal findings: Secondary | ICD-10-CM | POA: Diagnosis not present

## 2018-09-09 DIAGNOSIS — E559 Vitamin D deficiency, unspecified: Secondary | ICD-10-CM | POA: Diagnosis not present

## 2018-09-09 DIAGNOSIS — Z85850 Personal history of malignant neoplasm of thyroid: Secondary | ICD-10-CM | POA: Diagnosis not present

## 2018-09-15 DIAGNOSIS — Z1331 Encounter for screening for depression: Secondary | ICD-10-CM | POA: Diagnosis not present

## 2018-09-15 DIAGNOSIS — K529 Noninfective gastroenteritis and colitis, unspecified: Secondary | ICD-10-CM | POA: Diagnosis not present

## 2018-09-15 DIAGNOSIS — I447 Left bundle-branch block, unspecified: Secondary | ICD-10-CM | POA: Diagnosis not present

## 2018-09-15 DIAGNOSIS — I2699 Other pulmonary embolism without acute cor pulmonale: Secondary | ICD-10-CM | POA: Diagnosis not present

## 2018-09-15 DIAGNOSIS — Z0000 Encounter for general adult medical examination without abnormal findings: Secondary | ICD-10-CM | POA: Diagnosis not present

## 2018-09-15 DIAGNOSIS — C50919 Malignant neoplasm of unspecified site of unspecified female breast: Secondary | ICD-10-CM | POA: Diagnosis not present

## 2018-09-20 ENCOUNTER — Telehealth: Payer: Self-pay | Admitting: Hematology and Oncology

## 2018-09-20 ENCOUNTER — Inpatient Hospital Stay: Payer: BLUE CROSS/BLUE SHIELD | Attending: Hematology and Oncology | Admitting: Hematology and Oncology

## 2018-09-20 DIAGNOSIS — Z79899 Other long term (current) drug therapy: Secondary | ICD-10-CM | POA: Diagnosis not present

## 2018-09-20 DIAGNOSIS — Z9013 Acquired absence of bilateral breasts and nipples: Secondary | ICD-10-CM | POA: Diagnosis not present

## 2018-09-20 DIAGNOSIS — Z90722 Acquired absence of ovaries, bilateral: Secondary | ICD-10-CM

## 2018-09-20 DIAGNOSIS — E7800 Pure hypercholesterolemia, unspecified: Secondary | ICD-10-CM | POA: Diagnosis not present

## 2018-09-20 DIAGNOSIS — Z90710 Acquired absence of both cervix and uterus: Secondary | ICD-10-CM | POA: Insufficient documentation

## 2018-09-20 DIAGNOSIS — Z85820 Personal history of malignant melanoma of skin: Secondary | ICD-10-CM | POA: Insufficient documentation

## 2018-09-20 DIAGNOSIS — Z79811 Long term (current) use of aromatase inhibitors: Secondary | ICD-10-CM | POA: Diagnosis not present

## 2018-09-20 DIAGNOSIS — J90 Pleural effusion, not elsewhere classified: Secondary | ICD-10-CM | POA: Insufficient documentation

## 2018-09-20 DIAGNOSIS — Z170 Estrogen receptor positive status [ER+]: Secondary | ICD-10-CM | POA: Diagnosis not present

## 2018-09-20 DIAGNOSIS — C778 Secondary and unspecified malignant neoplasm of lymph nodes of multiple regions: Secondary | ICD-10-CM | POA: Diagnosis not present

## 2018-09-20 DIAGNOSIS — C50511 Malignant neoplasm of lower-outer quadrant of right female breast: Secondary | ICD-10-CM | POA: Insufficient documentation

## 2018-09-20 DIAGNOSIS — Z85850 Personal history of malignant neoplasm of thyroid: Secondary | ICD-10-CM | POA: Diagnosis not present

## 2018-09-20 DIAGNOSIS — R413 Other amnesia: Secondary | ICD-10-CM

## 2018-09-20 MED ORDER — ANASTROZOLE 1 MG PO TABS: ORAL_TABLET | ORAL | 3 refills | Status: DC

## 2018-09-20 NOTE — Telephone Encounter (Signed)
Patient decline avs and calendar °

## 2018-09-20 NOTE — Assessment & Plan Note (Signed)
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 (Patient to have oophorectomy in [place] and of June)  MRI Abd 03/20/17: Stable benign focal nodular hyperplasia in the right hepatic lobe. No evidence of metastatic disease or other acute findings. MRI Breast 03/17/17: No MR evidence of breast malignancy PET 9/4/18No evidence of hypermetabolic recurrent or metastatic disease. Decrease in left hepatic lobe hypermetabolism, corresponding to a benign abnormality on prior MRI ; likely focal fat; Resolved left-sided pleural effusion  Hospitalization 12/12/2017 for pneumothorax, third episode status post chest tube ___________________________________________________________________________  Current treatment: Tamoxifen 20 mg daily stopped due to pulmonary embolism started 3/13/201 stopped 11/29/2015 7. Started letrozole 06/26/2016, then changed to Anastrozole 03/23/2017  Patient was hospitalized in June 2019 for recurrent pneumothorax which required chest tube placement.  Breast cancer surveillance: 1.  Breast exam 09/20/2018: Benign 2.  No role of mammograms but she had bilateral mastectomies.  Return to clinic in 1 year for follow-up

## 2018-09-20 NOTE — Progress Notes (Signed)
Patient Care Team: [name], MD as PCP - General (Sports Medicine) [name], NP as Nurse Practitioner (Obstetrics and Gynecology) [name], MD as Consulting Physician (Sports Medicine) [name], MD as Consulting Physician (Hematology and Oncology) Jovita Kussmaul, MD as Consulting Physician (General Surgery) [name], NP as Nurse Practitioner (Hematology and Oncology) [name], MD as Consulting Physician (Obstetrics and Gynecology)  DIAGNOSIS:  Encounter Diagnoses  Name Primary?  . Malignant neoplasm of lower-outer quadrant of right female breast, unspecified estrogen receptor status ([place])   . Malignant neoplasm of lower-outer quadrant of right breast of female, estrogen receptor positive ([place])     SUMMARY OF ONCOLOGIC HISTORY:   Hodgkin's lymphoma ([place])   06/21/1979 Initial Diagnosis    Stage III Hodgkin's disease with cervical, retroperitoneal lymph nodes, treated with ABVD-MOPP (alternating) followed by chest and abdominal radiation at [facility] with complete response    09/23/2001 Pathology Results    Melanoma stage I on the back treated with resection    09/27/2002 Pathology Results    Thyroid cancer treated with thyroidectomy     Breast cancer of lower-outer quadrant of right female breast ([place])   07/18/2015 Breast MRI    Rt breast mass 0.9 x 0.8 x 0.8 cm, with clumped NME 4 cm, 2 sites of Bx proven flat epithelial atypia    07/25/2015 Initial Diagnosis    Rt Breast Biopsy: IDC with calcs and DCIS and ALH, Er 100%, PR 100%, Ki 67 5%, Her 2 Neg Ratio 1.35    07/25/2015 Clinical Stage    Stage IA: T1b N0    07/25/2015 Oncotype testing    RS 6 (5% ROR)    07/26/2015 Procedure    Genetic testing revealed BRIP-1    08/28/2015 Surgery    Bilateral mastectomies with reconstruction in [place] at [place] [facility] for restorative surgery (Dr.Delacroce): Residual high-grade DCIS 1.5 cm, 0/2 sentinel nodes    09/24/2015 - 11/29/2015 Anti-estrogen oral therapy    Tamoxifen 20 mg daily. Stopped May 2017 for pulmonary embolism    10/31/2015 Survivorship    Care plan completed and mailed to patient in lieu of in person visit at her request    11/20/2015 - 11/23/2015 Hospital Admission    Hospitalization for pulmonary embolism    05/15/2016 Surgery    Hysterectomy and BSO    07/23/2016 -  Anti-estrogen oral therapy    Letrozole 2.5 mg daily switched to anastrozole 03/23/2017 due to hot flashes and myalgias    03/17/2017 PET scan    No evidence of hypermetabolic recurrent or metastatic disease. Decrease in left hepatic lobe hypermetabolism, corresponding to a benign abnormality on prior MRI ; likely focal fat; Resolved left-sided pleural effusion     CHIEF COMPLIANT: Follow-up on anastrozole therapy  INTERVAL HISTORY: Stacy Chung is a 13-year with above-mentioned history of bilateral mastectomies with bilateral breast cancers who is currently on antiestrogen therapy with anastrozole.  She is doing much better with regards to muscle aches and pains but she has had short-term memory problems.  She is also noticed that her cholesterol has been going up.  Denies any lumps or nodules in the chest wall  REVIEW OF SYSTEMS:   Constitutional: Denies fevers, chills or abnormal weight loss Eyes: Denies blurriness of vision Ears, nose, mouth, throat, and face: Denies mucositis or sore throat Respiratory: Denies cough, dyspnea or wheezes Cardiovascular: Denies palpitation, chest discomfort Gastrointestinal:  Denies nausea, heartburn or change in bowel habits Skin: Denies abnormal  skin rashes Lymphatics: Denies new lymphadenopathy or easy bruising Neurological:Denies numbness, tingling or new weaknesses Behavioral/Psych: Mood is stable, no new changes  Extremities: No lower extremity edema Breast: Try bilateral mastectomies All other systems were reviewed with the patient and are negative.  I have reviewed  the past medical history, past surgical history, social history and family history with the patient and they are unchanged from previous note.  ALLERGIES:  is allergic to other; macrodantin; pertussis vaccines; and tape.  MEDICATIONS:  Current Outpatient Medications  Medication Sig Dispense Refill  . AMBULATORY NON FORMULARY MEDICATION Myofascial release therapy and massage therapy 2-3 times a week as needed 1 each 0  . anastrozole (ARIMIDEX) 1 MG tablet TAKE 1 TABLET(1 MG) BY MOUTH DAILY 90 tablet 3  . Biotin 2500 MCG CAPS Take 5,000 mcg by mouth daily. 30 capsule   . buPROPion HCl ER, XL, 450 MG TB24 Take 450 mg by mouth daily. 90 tablet 3  . CALCIUM PO Take 1,200 mg by mouth daily.     . cetirizine (ZYRTEC) 10 MG tablet Take 1 tablet (10 mg total) by mouth daily. 90 tablet 3  . Cholecalciferol (VITAMIN D3) 10000 UNITS capsule Take 10,000 Units by mouth daily. Only 5 days a week     . COLLAGEN PO Take by mouth daily.    . diclofenac sodium (VOLTAREN) 1 % GEL Apply 2 g topically 4 (four) times daily. To affected joint. 100 g 11  . esomeprazole (NEXIUM) 40 MG capsule Take 1 capsule (40 mg total) by mouth daily at 12 noon. 90 capsule 3  . fexofenadine-pseudoephedrine (ALLEGRA-D 24) 180-240 MG 24 hr tablet Take 1 tablet by mouth daily.    . fluticasone (FLONASE) 50 MCG/ACT nasal spray One spray in each nostril twice a day, use left hand for right nostril, and right hand for left nostril. 48 g 3  . levothyroxine (SYNTHROID, LEVOTHROID) 175 MCG tablet Take 1 tablet (175 mcg total) by mouth daily. 90 tablet 3  . naproxen sodium (ALEVE) 220 MG tablet Take 220 mg by mouth 2 (two) times daily as needed (pain).    . Prasterone (INTRAROSA) 6.5 MG INST Place 1 application vaginally daily. 30 each 2  . tetrahydrozoline 0.05 % ophthalmic solution Place 1 drop into both eyes as needed (dry eyes).    . Turmeric (CURCUMIN 95 PO) Take 65 mg by mouth daily.    . Vilazodone HCl (VIIBRYD STARTER PACK) 10 & 20 MG  KIT Take 1 tablet by mouth daily. Use as directed, 4 weeks QS 1 kit 0  . zolmitriptan (ZOMIG) 5 MG tablet Take 1 tablet (5 mg total) by mouth as needed. 10 tablet 11   No current facility-administered medications for this visit.     PHYSICAL EXAMINATION: ECOG PERFORMANCE STATUS: 1 - Symptomatic but completely ambulatory  Vitals:   09/20/18 1423  BP: 110/65  Pulse: 97  Resp: 17  Temp: 98.8 F (37.1 C)  SpO2: 99%   Filed Weights   09/20/18 1423  Weight: 168 lb 14.4 oz (76.6 kg)    GENERAL:alert, no distress and comfortable SKIN: skin color, texture, turgor are normal, no rashes or significant lesions EYES: normal, Conjunctiva are pink and non-injected, sclera clear OROPHARYNX:no exudate, no erythema and lips, buccal mucosa, and tongue normal  NECK: supple, thyroid normal size, non-tender, without nodularity LYMPH:  no palpable lymphadenopathy in the cervical, axillary or inguinal LUNGS: clear to auscultation and percussion with normal breathing effort HEART: regular rate & rhythm and no  murmurs and no lower extremity edema ABDOMEN:abdomen soft, non-tender and normal bowel sounds MUSCULOSKELETAL:no cyanosis of digits and no clubbing  NEURO: alert & oriented x 3 with fluent speech, no focal motor/sensory deficits EXTREMITIES: No lower extremity edema   LABORATORY DATA:  I have reviewed the data as listed CMP Latest Ref Rng & Units 04/14/2018 12/17/2017 12/13/2017  Glucose 65 - 99 mg/dL 92 118(H) 102(H)  BUN 7 - 25 mg/dL 18 23(H) 20  Creatinine 0.50 - 1.10 mg/dL 0.85 0.96 1.13(H)  Sodium 135 - 146 mmol/L 141 139 141  Potassium 3.5 - 5.3 mmol/L 3.9 4.0 3.5  Chloride 98 - 110 mmol/L 104 106 106  CO2 20 - 32 mmol/L _0 Calcium 8.6 - 10.2 mg/dL 9.6 9.0 8.8(L)  Total Protein 6.1 - 8.1 g/dL 7.1 - -  Total Bilirubin 0.2 - 1.2 mg/dL 0.3 - -  Alkaline Phos 40 - 150 U/L - - -  AST 10 - 35 U/L 21 - -  ALT 6 - 29 U/L 29 - -    Lab Results  Component Value Date   WBC 5.5  04/14/2018   HGB 13.5 04/14/2018   HCT 40.9 04/14/2018   MCV 92.3 04/14/2018   PLT 423 (H) 04/14/2018   NEUTROABS 4.1 03/17/2017    ASSESSMENT & PLAN:  Breast cancer of lower-outer quadrant of right female breast ([place]) Rt Breast mass 07/18/15: MRI breast: 0.9 x 0.8 x 0.8 cm, with clumped NME 4 cm, 2 sites of Bx proven flat epithelial atypia, T1bN0 (stage 1A) Rt Breast Biopsy 07/25/15: IDC with calcs and DCIS and ALH, Er 100%, PR 100%, Ki 67 5%, Her 2 Neg Ratio 1.35 Bilateral mastectomies 08/28/2015: In [place], 1.5 cm residual high-grade DCIS Tis N0 stage 0 Final pathologic staging: T1a N0 stage IA DX recurrence score 6, 5% risk of recurrence with tamoxifen Liver biopsy: 12/20/15: Benign  BRIP-1: This increases her risk of breast and ovarian cancer. (Patient to have oophorectomy in [place] and of June)  MRI Abd 03/20/17: Stable benign focal nodular hyperplasia in the right hepatic lobe. No evidence of metastatic disease or other acute findings. MRI Breast 03/17/17: No MR evidence of breast malignancy PET 9/4/18No evidence of hypermetabolic recurrent or metastatic disease. Decrease in left hepatic lobe hypermetabolism, corresponding to a benign abnormality on prior MRI ; likely focal fat; Resolved left-sided pleural effusion  Hospitalization 12/12/2017 for pneumothorax, third episode status post chest tube ___________________________________________________________________________  Current treatment: Tamoxifen 20 mg daily stopped due to pulmonary embolism started 3/13/201 stopped 11/29/2015 7. Started letrozole 06/26/2016, then changed to Anastrozole 03/23/2017 Plan to treat her for 5 years.  Anastrozole toxicities: 1.  Hypercholesterolemia 2.  Joint aches and stiffness 3.  Short-term memory loss  Patient was hospitalized in June 2019 for recurrent pneumothorax which required chest tube placement.  Breast cancer surveillance: 1.  Breast exam 09/20/2018: Benign 2.  No role of  mammograms but she had bilateral mastectomies.  Return to clinic in 1 year for follow-up    No orders of the defined types were placed in this encounter.  The patient has a good understanding of the overall plan. she agrees with it. she will call with any problems that may develop before the next visit here.   Harriette Ohara, MD 09/20/18

## 2018-09-22 ENCOUNTER — Ambulatory Visit: Payer: BLUE CROSS/BLUE SHIELD | Admitting: Hematology and Oncology

## 2018-10-06 ENCOUNTER — Encounter (HOSPITAL_BASED_OUTPATIENT_CLINIC_OR_DEPARTMENT_OTHER): Payer: Self-pay | Admitting: *Deleted

## 2018-10-06 ENCOUNTER — Other Ambulatory Visit: Payer: Self-pay

## 2018-10-06 ENCOUNTER — Emergency Department (HOSPITAL_BASED_OUTPATIENT_CLINIC_OR_DEPARTMENT_OTHER): Payer: BLUE CROSS/BLUE SHIELD

## 2018-10-06 ENCOUNTER — Emergency Department (HOSPITAL_BASED_OUTPATIENT_CLINIC_OR_DEPARTMENT_OTHER)
Admission: EM | Admit: 2018-10-06 | Discharge: 2018-10-06 | Disposition: A | Discharge status: Home / Self Care | Payer: BLUE CROSS/BLUE SHIELD | Attending: Emergency Medicine | Admitting: Emergency Medicine

## 2018-10-06 DIAGNOSIS — E039 Hypothyroidism, unspecified: Secondary | ICD-10-CM | POA: Diagnosis not present

## 2018-10-06 DIAGNOSIS — F419 Anxiety disorder, unspecified: Secondary | ICD-10-CM | POA: Insufficient documentation

## 2018-10-06 DIAGNOSIS — R51 Headache: Secondary | ICD-10-CM | POA: Diagnosis not present

## 2018-10-06 DIAGNOSIS — Z853 Personal history of malignant neoplasm of breast: Secondary | ICD-10-CM | POA: Insufficient documentation

## 2018-10-06 DIAGNOSIS — Z85850 Personal history of malignant neoplasm of thyroid: Secondary | ICD-10-CM | POA: Diagnosis not present

## 2018-10-06 DIAGNOSIS — R079 Chest pain, unspecified: Secondary | ICD-10-CM | POA: Diagnosis not present

## 2018-10-06 DIAGNOSIS — Z85828 Personal history of other malignant neoplasm of skin: Secondary | ICD-10-CM | POA: Insufficient documentation

## 2018-10-06 DIAGNOSIS — Z79899 Other long term (current) drug therapy: Secondary | ICD-10-CM | POA: Diagnosis not present

## 2018-10-06 DIAGNOSIS — Z8571 Personal history of Hodgkin lymphoma: Secondary | ICD-10-CM | POA: Insufficient documentation

## 2018-10-06 DIAGNOSIS — F329 Major depressive disorder, single episode, unspecified: Secondary | ICD-10-CM | POA: Diagnosis not present

## 2018-10-06 DIAGNOSIS — I447 Left bundle-branch block, unspecified: Secondary | ICD-10-CM | POA: Diagnosis not present

## 2018-10-06 DIAGNOSIS — R519 Headache, unspecified: Secondary | ICD-10-CM

## 2018-10-06 LAB — BASIC METABOLIC PANEL
Anion gap: 10 (ref 5–15)
BUN: 20 mg/dL (ref 6–20)
CO2: 21 mmol/L — ABNORMAL LOW (ref 22–32)
Calcium: 9 mg/dL (ref 8.9–10.3)
Chloride: 105 mmol/L (ref 98–111)
Creatinine, Ser: 0.65 mg/dL (ref 0.44–1.00)
GFR calc Af Amer: >60 mL/min (ref >60)
Glucose, Bld: 99 mg/dL (ref 70–99)
Potassium: 3.7 mmol/L (ref 3.5–5.1)
Sodium: 136 mmol/L (ref 135–145)

## 2018-10-06 LAB — CBC WITH DIFFERENTIAL/PLATELET
Abs Immature Granulocytes: 0.03 10*3/uL (ref 0.00–0.07)
Basophils Absolute: 0.1 10*3/uL (ref 0.0–0.1)
Basophils Relative: 1 %
EOS PCT: 1 %
Eosinophils Absolute: 0.1 10*3/uL (ref 0.0–0.5)
HCT: 41.3 % (ref 36.0–46.0)
Hemoglobin: 13.1 g/dL (ref 12.0–15.0)
Immature Granulocytes: 0 %
Lymphocytes Relative: 15 %
Lymphs Abs: 1.5 10*3/uL (ref 0.7–4.0)
MCH: 30.3 pg (ref 26.0–34.0)
MCHC: 31.7 g/dL (ref 30.0–36.0)
MCV: 95.6 fL (ref 80.0–100.0)
Monocytes Absolute: 0.6 10*3/uL (ref 0.1–1.0)
Monocytes Relative: 6 %
Neutro Abs: 7.7 10*3/uL (ref 1.7–7.7)
Neutrophils Relative %: 77 %
PLATELETS: 413 10*3/uL — AB (ref 150–400)
RBC: 4.32 MIL/uL (ref 3.87–5.11)
RDW: 13.7 % (ref 11.5–15.5)
WBC: 10 10*3/uL (ref 4.0–10.5)
nRBC: 0 % (ref 0.0–0.2)

## 2018-10-06 LAB — TROPONIN I: Troponin I: <0.03 ng/mL (ref <0.03)

## 2018-10-06 MED ORDER — SODIUM CHLORIDE 0.9% FLUSH
3.0000 mL | Freq: Once | INTRAVENOUS | Status: AC
  Administered 2018-10-06: 3 mL via INTRAVENOUS
  Filled 2018-10-06: qty 3

## 2018-10-06 MED ORDER — METOCLOPRAMIDE HCL 5 MG/ML IJ SOLN
10.0000 mg | Freq: Once | INTRAMUSCULAR | Status: AC
  Administered 2018-10-06: 10 mg via INTRAVENOUS
  Filled 2018-10-06: qty 2

## 2018-10-06 MED ORDER — SODIUM CHLORIDE 0.9 % IV BOLUS
1000.0000 mL | Freq: Once | INTRAVENOUS | Status: AC
  Administered 2018-10-06: 1000 mL via INTRAVENOUS

## 2018-10-06 MED ORDER — KETOROLAC TROMETHAMINE 30 MG/ML IJ SOLN
15.0000 mg | Freq: Once | INTRAMUSCULAR | Status: AC
  Administered 2018-10-06: 15 mg via INTRAVENOUS
  Filled 2018-10-06: qty 1

## 2018-10-06 MED ORDER — METHYLPREDNISOLONE SODIUM SUCC 125 MG IJ SOLR
125.0000 mg | Freq: Once | INTRAMUSCULAR | Status: AC
  Administered 2018-10-06: 125 mg via INTRAVENOUS
  Filled 2018-10-06: qty 2

## 2018-10-06 NOTE — ED Notes (Signed)
Pt updated regarding plan of care.

## 2018-10-06 NOTE — ED Provider Notes (Signed)
[place] [facility] EMERGENCY DEPARTMENT Provider Note   CSN: [id number] Arrival date & time: 10/06/18  1736    History   Chief Complaint Chief Complaint  Patient presents with  . Migraine    HPI Stacy Chung is a 48 y.o. female.     HPI   Stacy Chung is a 48 y.o. female, with a history of breast cancer, GERD, anxiety, depression, PE, presenting to the ED with headache beginning around 3 PM today.  She describes his headache as a migraine.  She has had similar headaches in the past.  It is bilateral frontal, feels like a pounding, 7/10, nonradiating.  Accompanied by photophobia, nausea, and vomiting.  After few episodes of vomiting, she began to have pain in the chest, upper, central chest, feels like a soreness, 2/10, nonradiating.  Patient also began to have upper back pain, 6/10, nonradiating.  Patient states her current chest discomfort is not consistent with previous occurrences of PE or pneumothorax.  She is not under any current cancer treatments.  Denies fever/chills, shortness of breath, cough, hematemesis, diarrhea, constipation, abdominal pain, vision changes, dizziness, neuro deficits, falls/head injury, or any other complaints.     Past Medical History:  Diagnosis Date  . Allergy   . Anxiety   . Breast cancer ([place])   . 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 Cancer ([place]) 1986   Hodgkins lymphoma  . Carcinoma of thyroid ([place])   . Depression   . Family history of breast cancer   . Family history of colon cancer   . Family history of ovarian cancer   . Family history of prostate cancer   . GERD (gastroesophageal reflux disease)   . History of chicken pox   . Hodgkin's disease   . Hx: UTI (urinary tract infection)   . Increased frequency of headaches   . Left breast mass   . Migraines   . PONV (postoperative nausea and vomiting)   . Primary spontaneous pneumothorax 1990   left  . Pulmonary embolism ([place]) 11/30/2015  . Recurrent  spontaneous pneumothorax 1991   left  . Skin cancer 2003   Melanoma  . Thyroid disease     Patient Active Problem List   Diagnosis Date Noted  . Memory loss 06/25/2018  . Neck pain 03/25/2018  . Pneumothorax on left 12/13/2017  . Hypothyroidism 12/13/2017  . GERD (gastroesophageal reflux disease) 12/13/2017  . Anxiety 12/13/2017  . Annual physical exam 11/27/2017  . Right wrist pain 11/12/2017  . Rotator cuff syndrome of both shoulders 07/28/2017  . Pulmonary embolism ([place]) 01/29/2016  . Liver lesion 01/03/2016  . Family history of breast cancer   . Family history of ovarian cancer   . Family history of prostate cancer   . Family history of colon cancer   . Skin cancer   . Genetic testing 11/20/2015  . Ganglion cyst of flexor tendon sheath of finger of left hand 11/19/2015  . Breast cancer of lower-outer quadrant of right female breast ([place]) 09/22/2015  . 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 Peroneus longus tear 11/24/2013  . Toe swelling 09/06/2013  . Melanoma ([place]) 06/20/2011  . Hodgkin's lymphoma ([place]) 06/20/2011  . Migraine 06/20/2011  . Malignant neoplasm of thyroid gland ([place]) 06/20/2011  . Hyperglycemia 06/20/2011  . Hirsutism 06/20/2011    Past Surgical History:  Procedure Laterality Date  . ABDOMINAL HYSTERECTOMY    . [place]  collasped lung, spontaneous  . FOOT TENDON SURGERY Left   . LAPAROSCOPIC SPLENECTOMY  1987   Hodgkins Disease  . MASTECTOMY     BIL  . THORACOTOMY Left 1991   recurrent spontaneous pneumothorax - Dr Truman Hayward  . THYROIDECTOMY  2004   nodules, carcinoma  . URETEROPLASTY  1977   malformed ureter     OB History   No obstetric history on file.      Home Medications    Prior to Admission medications   Medication Sig Start Date End Date Taking? Authorizing Provider  AMBULATORY NON FORMULARY MEDICATION Myofascial release therapy and massage therapy 2-3 times a week as needed  09/22/17   [name], MD  anastrozole (ARIMIDEX) 1 MG tablet TAKE 1 TABLET(1 MG) BY MOUTH DAILY 09/20/18   [name], MD  Biotin 2500 MCG CAPS Take 5,000 mcg by mouth daily. 03/23/17   [name], MD  buPROPion HCl ER, XL, 450 MG TB24 Take 450 mg by mouth daily. 04/15/18   [name], MD  CALCIUM PO Take 1,200 mg by mouth daily.     [provider]  cetirizine (ZYRTEC) 10 MG tablet Take 1 tablet (10 mg total) by mouth daily. 04/15/18   [name], MD  Cholecalciferol (VITAMIN D3) 10000 UNITS capsule Take 10,000 Units by mouth daily. Only 5 days a week     [provider]  COLLAGEN PO Take by mouth daily.    [provider]  diclofenac sodium (VOLTAREN) 1 % GEL Apply 2 g topically 4 (four) times daily. To affected joint. 07/02/18   Gregor Hams, MD  esomeprazole (NEXIUM) 40 MG capsule Take 1 capsule (40 mg total) by mouth daily at 12 noon. 04/15/18   [name], MD  fexofenadine-pseudoephedrine (ALLEGRA-D 24) 180-240 MG 24 hr tablet Take 1 tablet by mouth daily.    [provider]  fluticasone (FLONASE) 50 MCG/ACT nasal spray One spray in each nostril twice a day, use left hand for right nostril, and right hand for left nostril. 04/15/18   [name], MD  levothyroxine (SYNTHROID, LEVOTHROID) 175 MCG tablet Take 1 tablet (175 mcg total) by mouth daily. 05/28/18   [name], MD  naproxen sodium (ALEVE) 220 MG tablet Take 220 mg by mouth 2 (two) times daily as needed (pain).    [provider]  Prasterone (INTRAROSA) 6.5 MG INST Place 1 application vaginally daily. 09/21/17   Gardenia Phlegm, NP  tetrahydrozoline 0.05 % ophthalmic solution Place 1 drop into both eyes as needed (dry eyes).    [provider]  Turmeric (CURCUMIN 95 PO) Take 65 mg by mouth daily.    [provider]  Vilazodone HCl (VIIBRYD STARTER PACK) 10 & 20 MG KIT Take 1 tablet by mouth  daily. Use as directed, 4 weeks QS 06/29/18   [name], MD  zolmitriptan (ZOMIG) 5 MG tablet Take 1 tablet (5 mg total) by mouth as needed. 04/15/18   [name], MD    Family History Family History  Problem Relation Age of Onset  . Alcohol abuse Maternal Aunt   . Prostate cancer Maternal Uncle 48  . Colon cancer Maternal Grandmother        dx in her 39s  . Prostate cancer Maternal Grandfather        dx in his 60s  . Cancer Other        FH of Breast Cancer, Ovarian/Uterine Cancer  . Heart  disease Maternal Uncle        45s  . Breast cancer Other        MGFs sister - reportedly BRCA+  . Ovarian cancer Other        PGFs mother  . Ovarian cancer Other        PGFs maternal aunt  . Ovarian cancer Other        PGFs maternal grandmohter  . Breast cancer Other        MGMs sister  . Breast cancer Other        MGFs mother    Social History Social History   Tobacco Use  . Smoking status: Never Smoker  . Smokeless tobacco: Never Used  Substance Use Topics  . Alcohol use: Yes    Comment: social  . Drug use: No     Allergies   Other; Macrodantin; Pertussis vaccines; and Tape   Review of Systems Review of Systems  Constitutional: Negative for chills, diaphoresis and fever.  Respiratory: Negative for cough and shortness of breath.   Cardiovascular: Positive for chest pain. Negative for palpitations and leg swelling.  Gastrointestinal: Positive for nausea and vomiting. Negative for abdominal pain, blood in stool, constipation and diarrhea.  Musculoskeletal: Positive for back pain. Negative for neck pain and neck stiffness.  Neurological: Positive for headaches. Negative for dizziness, syncope, weakness and numbness.  All other systems reviewed and are negative.    Physical Exam Updated Vital Signs BP 128/74 (BP Location: Left Arm)   Pulse 96   Temp 97.9 F (36.6 C) (Oral)   Resp 16   Ht 5' 5" (1.651 m)   Wt 72.6 kg   LMP 02/29/2016   SpO2  100%   BMI 26.63 kg/m   Physical Exam Vitals signs and nursing note reviewed.  Constitutional:      General: She is not in acute distress.    Appearance: She is well-developed. She is not diaphoretic.  HENT:     Head: Normocephalic and atraumatic.     Nose: Nose normal.     Mouth/Throat:     Mouth: Mucous membranes are moist.     Pharynx: Oropharynx is clear.  Eyes:     Extraocular Movements: Extraocular movements intact.     Conjunctiva/sclera: Conjunctivae normal.     Pupils: Pupils are equal, round, and reactive to light.  Neck:     Musculoskeletal: Neck supple.  Cardiovascular:     Rate and Rhythm: Normal rate and regular rhythm.     Pulses: Normal pulses.          Radial pulses are 2+ on the right side and 2+ on the left side.     Heart sounds: Normal heart sounds.     Comments: Tactile temperature in the extremities appropriate and equal bilaterally. Pulmonary:     Effort: Pulmonary effort is normal. No respiratory distress.     Breath sounds: Normal breath sounds.  Chest:     Chest wall: No tenderness.  Abdominal:     Palpations: Abdomen is soft.     Tenderness: There is no abdominal tenderness. There is no guarding.  Musculoskeletal:     Right lower leg: No edema.     Left lower leg: No edema.     Comments: Normal motor function intact in all extremities. No midline spinal tenderness.   Lymphadenopathy:     Cervical: No cervical adenopathy.  Skin:    General: Skin is warm and dry.  Neurological:  Mental Status: She is alert and oriented to person, place, and time.     Comments: Sensation grossly intact to light touch in the extremities.  Grip strengths equal bilaterally.  Strength 5/5 in all extremities. No gait disturbance. Coordination intact. Cranial nerves III-XII grossly intact. No facial droop.   Psychiatric:        Mood and Affect: Mood and affect normal.        Speech: Speech normal.        Behavior: Behavior normal.      ED Treatments /  Results  Labs (all labs ordered are listed, but only abnormal results are displayed) Labs Reviewed  CBC WITH DIFFERENTIAL/PLATELET - Abnormal; Notable for the following components:      Result Value   Platelets 413 (*)    All other components within normal limits  BASIC METABOLIC PANEL - Abnormal; Notable for the following components:   CO2 21 (*)    All other components within normal limits  TROPONIN I    EKG EKG Interpretation  Date/Time:  Wednesday October 06 2018 17:48:14 EDT Ventricular Rate:  91 PR Interval:    QRS Duration: 152 QT Interval:  453 QTC Calculation: 558 R Axis:   -26 Text Interpretation:  Sinus rhythm Left bundle branch block When compared to prior, no significant changes seen.  No STEMI Confirmed by Antony Blackbird [phone]) on 10/06/2018 5:52:44 PM   Radiology Dg Chest 2 View  Result Date: 10/06/2018 CLINICAL DATA:  Sternal chest pain radiating to the back. EXAM: CHEST - 2 VIEW COMPARISON:  03/25/2018 FINDINGS: The heart size and mediastinal contours are within normal limits. Aortic atherosclerosis without aneurysm. Both lungs are clear. Numerous surgical clips project over the anterior chest wall and breast tissue. Surgical clips are also seen in the left upper quadrant of the abdomen. The visualized skeletal structures are unremarkable. IMPRESSION: No active cardiopulmonary disease. Electronically Signed   By: Ashley Royalty M.D.   On: 10/06/2018 18:49    Procedures Procedures (including critical care time)  Medications Ordered in ED Medications  sodium chloride flush (NS) 0.9 % injection 3 mL (3 mLs Intravenous Given 10/06/18 1825)  sodium chloride 0.9 % bolus 1,000 mL (0 mLs Intravenous Stopped 10/06/18 1934)  ketorolac (TORADOL) 30 MG/ML injection 15 mg (15 mg Intravenous Given 10/06/18 1831)  methylPREDNISolone sodium succinate (SOLU-MEDROL) 125 mg/2 mL injection 125 mg (125 mg Intravenous Given 10/06/18 1829)  metoCLOPramide (REGLAN) injection 10 mg (10 mg  Intravenous Given 10/06/18 1826)     Initial Impression / Assessment and Plan / ED Course  I have reviewed the triage vital signs and the nursing notes.  Pertinent labs & imaging results that were available during my care of the patient were reviewed by me and considered in my medical decision making (see chart for details).  Clinical Course as of Oct 05 1933  Wed Oct 06, 2018  1904 Patient states she feels back to normal.  All her complaints have resolved.  No additional complaints have occurred.  States she is ready to go as soon as possible.   [SJ]    Clinical Course User Index [SJ] ,  C, PA-C        Patient presents with headache consistent with past headaches.  No evidence of acute neurologic deficit.  Low suspicion for ACS.  Chest pain is nonexertional and is atypical sounding for ACS.  HEART score is 3, indicating low risk for a cardiac event.  EKG without evidence of acute ischemia or  pathologic/symptomatic arrhythmia. Wells criteria score is 0, indicating low risk for PE.   Dissection was considered, but thought less likely base on: History and description of the pain are not suggestive, patient is not ill-appearing, lack of risk factors, equal bilateral pulses, lack of neurologic deficits, no widened mediastinum on chest x-ray.  Troponin negative.  Slight decrease in CO2, which may be associated with mild dehydration.  Lab results otherwise reassuring. Patient symptoms resolved early in the ED course and did not recur.  Patient was motivated towards discharge.  She did not want to stay for repeat troponin.  My suspicion for cardiac cause of patient's pain is low.  The patient was given instructions for home care as well as return precautions. Patient voices understanding of these instructions, accepts the plan, and is comfortable with discharge.  Vitals:   10/06/18 1742 10/06/18 1745 10/06/18 1916  BP:  128/74 (!) 107/59  Pulse:  96 90  Resp:  16 18  Temp:  97.9  F (36.6 C)   TempSrc:  Oral   SpO2:  100% 99%  Weight: 72.6 kg    Height: 5' 5" (1.651 m)        Final Clinical Impressions(s) / ED Diagnoses   Final diagnoses:  Frontal headache    ED Discharge Orders    None       Layla Maw 10/06/18 1941    Tegeler, Gwenyth Allegra, MD 10/06/18 2038

## 2018-10-06 NOTE — ED Triage Notes (Signed)
Pt c/o " migraine" , chest pain and n/v x 2 hrs

## 2018-10-06 NOTE — Discharge Instructions (Signed)
Headache Your lab results showed no acute abnormalities.  For future headaches please try the following regimen: Antiinflammatory medications: Take 600 mg of ibuprofen every 6 hours or 440 mg (over the counter dose) to 500 mg (prescription dose) of naproxen every 12 hours.  Use this regimen until headache subsides for up to 3 days .  After this time, these medications may be used as needed for pain. Take these medications with food to avoid upset stomach. Choose only one of these medications, do not take them together. Tylenol: Should you continue to have additional pain while taking the ibuprofen or naproxen, you may add in tylenol as needed. Your daily total maximum amount of tylenol from all sources should be limited to 4000mg /day for persons without liver problems, or 2000mg /day for those with liver problems.  Hydration: Have a goal of about a half liter of water every couple hours to stay well hydrated.   Sleep: Please be sure to get plenty of sleep with a goal of 8 hours per night. Having a regular bed time and bedtime routine can help with this.  Screens: Reduce the amount of time you are in front of screens.  Take about a 5-10-minute break every hour or every couple hours to give your eyes rest.  Do not use screens in dark rooms.  Glasses with a blue light filter may also help reduce eye fatigue.  Stress: Take steps to reduce stress as much as possible.   Follow up: Follow-up with your primary care provider on this issue.  May also need to follow-up with the neurologist for increased frequency of headaches.

## 2018-10-29 ENCOUNTER — Telehealth (INDEPENDENT_AMBULATORY_CARE_PROVIDER_SITE_OTHER): Payer: PRIVATE HEALTH INSURANCE | Admitting: *Deleted

## 2018-10-29 DIAGNOSIS — F419 Anxiety disorder, unspecified: Secondary | ICD-10-CM

## 2018-10-29 MED ORDER — VORTIOXETINE HBR 5 MG PO TABS: 15.0000 mg | ORAL_TABLET | Freq: Every day | ORAL | 3 refills | Status: DC

## 2018-10-29 NOTE — Assessment & Plan Note (Signed)
Responded relatively well to Trintellix, but 20 mg was a bit too high and 10 mg was a bit too low. Dropping to 15 mg of Trintellix, we will do this with three 5 mg tablets.

## 2018-10-29 NOTE — Telephone Encounter (Signed)
Since it does not come in a 15 mg tablet I am going to do #3, 5 mg tablets daily.  I spent 5 total minutes of online digital evaluation and management services.

## 2018-10-29 NOTE — Telephone Encounter (Signed)
Pt left vm wanting to know if she can do 15mg  of Trintellix.  10mg  wasn't quite enough and 20mg  made her have "continuous jaw clenching".  Please advise.

## 2018-11-24 ENCOUNTER — Ambulatory Visit (INDEPENDENT_AMBULATORY_CARE_PROVIDER_SITE_OTHER): Payer: BLUE CROSS/BLUE SHIELD

## 2018-11-24 ENCOUNTER — Ambulatory Visit (INDEPENDENT_AMBULATORY_CARE_PROVIDER_SITE_OTHER): Payer: BLUE CROSS/BLUE SHIELD | Admitting: Sports Medicine

## 2018-11-24 ENCOUNTER — Other Ambulatory Visit: Payer: Self-pay

## 2018-11-24 ENCOUNTER — Encounter: Payer: Self-pay | Admitting: Sports Medicine

## 2018-11-24 DIAGNOSIS — R0602 Shortness of breath: Secondary | ICD-10-CM

## 2018-11-24 DIAGNOSIS — J849 Interstitial pulmonary disease, unspecified: Secondary | ICD-10-CM | POA: Diagnosis not present

## 2018-11-24 DIAGNOSIS — R0689 Other abnormalities of breathing: Secondary | ICD-10-CM | POA: Diagnosis not present

## 2018-11-24 DIAGNOSIS — M75101 Unspecified rotator cuff tear or rupture of right shoulder, not specified as traumatic: Secondary | ICD-10-CM

## 2018-11-24 DIAGNOSIS — J438 Other emphysema: Secondary | ICD-10-CM

## 2018-11-24 DIAGNOSIS — R490 Dysphonia: Secondary | ICD-10-CM | POA: Diagnosis not present

## 2018-11-24 DIAGNOSIS — M75102 Unspecified rotator cuff tear or rupture of left shoulder, not specified as traumatic: Secondary | ICD-10-CM

## 2018-11-24 DIAGNOSIS — J948 Other specified pleural conditions: Secondary | ICD-10-CM | POA: Insufficient documentation

## 2018-11-24 DIAGNOSIS — R062 Wheezing: Secondary | ICD-10-CM | POA: Diagnosis not present

## 2018-11-24 DIAGNOSIS — R0989 Other specified symptoms and signs involving the circulatory and respiratory systems: Secondary | ICD-10-CM

## 2018-11-24 NOTE — Assessment & Plan Note (Signed)
Right-sided glenohumeral injection today, previous injection was back in January. Return as needed, rehab exercises given.

## 2018-11-24 NOTE — Assessment & Plan Note (Addendum)
Stridorous sounds at night, patient was able to recorded on her cell phone. Auscultation is clear today. We are going to get a chest x-ray but if negative we may still proceed with CT of the chest considering her history of multiple neoplastic processes.  Abnormal appearing pulmonary interstitial parenchyma.  With multiple malignancies we are going to get a chest CT.

## 2018-11-24 NOTE — Progress Notes (Addendum)
Subjective:    CC: Shoulder pain, abnormal breathing  HPI: This is a pleasant 48 year old female, she has a history of bilateral rotator cuff syndrome, with more articular sided symptoms, glenohumeral symptoms.  We did a glenohumeral injection on the right approximately 5 months ago, she did well until a couple of weeks ago.  Now having a recurrence of pain, moderate, persistent, localized without radiation.  Oral analgesics have not been effective.  Abnormal breathing: As noted increasing stridor, coarse sounds, she describes them as if there is a crunchiness in her left chest wall when laying on the left side.  No cough, no shortness of breath, no fevers, chills.  She does have a history of Hodgkin's lymphoma, breast cancer, ovarian cancer.  She is post TRAM flap reconstruction.  I reviewed the past medical history, family history, social history, surgical history, and allergies today and no changes were needed.  Please see the problem list section below in epic for further details.  Past Medical History: Past Medical History:  Diagnosis Date  . Allergy   . Anxiety   . Breast cancer ([place])   . 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 Cancer ([place]) 1986   Hodgkins lymphoma  . Carcinoma of thyroid ([place])   . Depression   . Family history of breast cancer   . Family history of colon cancer   . Family history of ovarian cancer   . Family history of prostate cancer   . GERD (gastroesophageal reflux disease)   . History of chicken pox   . Hodgkin's disease   . Hx: UTI (urinary tract infection)   . Increased frequency of headaches   . Left breast mass   . Migraines   . PONV (postoperative nausea and vomiting)   . Primary spontaneous pneumothorax 1990   left  . Pulmonary embolism ([place]) 11/30/2015  . Recurrent spontaneous pneumothorax 1991   left  . Skin cancer 2003   Melanoma  . Thyroid disease    Past Surgical History: Past Surgical History:  Procedure Laterality Date   . ABDOMINAL HYSTERECTOMY    . CHEST TUBE INSERTION  1990   collasped lung, spontaneous  . FOOT TENDON SURGERY Left   . LAPAROSCOPIC SPLENECTOMY  1987   Hodgkins Disease  . MASTECTOMY     BIL  . THORACOTOMY Left 1991   recurrent spontaneous pneumothorax - Dr Truman Hayward  . THYROIDECTOMY  2004   nodules, carcinoma  . URETEROPLASTY  1977   malformed ureter   Social History: Social History   Socioeconomic History  . Marital status: Married    Spouse name: Not on file  . Number of children: 2  . Years of education: 11  . Highest education level: Not on file  Occupational History  . Occupation: Chief Strategy Officer  Social Needs  . Financial resource strain: Not on file  . Food insecurity:    Worry: Not on file    Inability: Not on file  . Transportation needs:    Medical: Not on file    Non-medical: Not on file  Tobacco Use  . Smoking status: Never Smoker  . Smokeless tobacco: Never Used  Substance and Sexual Activity  . Alcohol use: Yes    Comment: social  . Drug use: No  . Sexual activity: Yes    Birth control/protection: None  Lifestyle  . Physical activity:    Days per week: Not on file    Minutes per session: Not on file  . Stress:  Not on file  Relationships  . Social connections:    Talks on phone: Not on file    Gets together: Not on file    Attends religious service: Not on file    Active member of club or organization: Not on file    Attends meetings of clubs or organizations: Not on file    Relationship status: Not on file  Other Topics Concern  . Not on file  Social History Narrative   Regular exercise-no   Family History: Family History  Problem Relation Age of Onset  . Alcohol abuse Maternal Aunt   . Prostate cancer Maternal Uncle 81  . Colon cancer Maternal Grandmother        dx in her 56s  . Prostate cancer Maternal Grandfather        dx in his 2s  . Cancer Other        FH of Breast Cancer, Ovarian/Uterine Cancer  . Heart disease Maternal  Uncle        86s  . Breast cancer Other        MGFs sister - reportedly BRCA+  . Ovarian cancer Other        PGFs mother  . Ovarian cancer Other        PGFs maternal aunt  . Ovarian cancer Other        PGFs maternal grandmohter  . Breast cancer Other        MGMs sister  . Breast cancer Other        MGFs mother   Allergies: Allergies  Allergen Reactions  . Other Rash    "bandaides"  . Macrodantin   . Pertussis Vaccines   . Tape Rash    "plastic tape"   Medications: See med rec.  Review of Systems: No fevers, chills, night sweats, weight loss, chest pain, or shortness of breath.   Objective:    General: Well Developed, well nourished, and in no acute distress.  Neuro: Alert and oriented x3, extra-ocular muscles intact, sensation grossly intact.  HEENT: Normocephalic, atraumatic, pupils equal round reactive to light, neck supple, no masses, no lymphadenopathy, thyroid nonpalpable.  Skin: Warm and dry, no rashes. Cardiac: Regular rate and rhythm, no murmurs rubs or gallops, no lower extremity edema.  Respiratory: Clear to auscultation bilaterally. Not using accessory muscles, speaking in full sentences.  No tenderness to palpation, no subcutaneous emphysema, no bruising, no tenderness over the entirety of the left chest wall.  Procedure: Real-time Ultrasound Guided injection of the right glenohumeral joint Device: GE Logiq E  Verbal informed consent obtained.  Time-out conducted.  Noted no overlying erythema, induration, or other signs of local infection.  Skin prepped in a sterile fashion.  Local anesthesia: Topical Ethyl chloride.  With sterile technique and under real time ultrasound guidance:  1 cc Kenalog 40, 2 cc lidocaine, 2 cc bupivacaine injected easily Completed without difficulty  Pain immediately resolved suggesting accurate placement of the medication.  Advised to call if fevers/chills, erythema, induration, drainage, or persistent bleeding.  Images  permanently stored and available for review in the ultrasound unit.  Impression: Technically successful ultrasound guided injection.  Impression and Recommendations:    Rotator cuff syndrome of both shoulders Right-sided glenohumeral injection today, previous injection was back in January. Return as needed, rehab exercises given.  Abnormal breath sounds Stridorous sounds at night, patient was able to recorded on her cell phone. Auscultation is clear today. We are going to get a chest x-ray but if negative we  may still proceed with CT of the chest considering her history of multiple neoplastic processes.  Abnormal appearing pulmonary interstitial parenchyma.  With multiple malignancies we are going to get a chest CT.   ___________________________________________ Gwen Her. Dianah Field, M.D., ABFM., CAQSM. Primary Care and Sports Medicine [facility]  Adjunct Professor of [employer]

## 2018-11-25 ENCOUNTER — Encounter: Payer: Self-pay | Admitting: Sports Medicine

## 2018-11-25 NOTE — Addendum Note (Signed)
Addended by: [name] on: 11/25/2018 12:35 PM   Modules accepted: Orders

## 2018-12-01 ENCOUNTER — Other Ambulatory Visit: Payer: Self-pay

## 2018-12-01 ENCOUNTER — Ambulatory Visit (INDEPENDENT_AMBULATORY_CARE_PROVIDER_SITE_OTHER): Payer: BLUE CROSS/BLUE SHIELD

## 2018-12-01 DIAGNOSIS — J849 Interstitial pulmonary disease, unspecified: Secondary | ICD-10-CM | POA: Diagnosis not present

## 2018-12-01 DIAGNOSIS — J438 Other emphysema: Secondary | ICD-10-CM | POA: Diagnosis not present

## 2018-12-01 DIAGNOSIS — R0602 Shortness of breath: Secondary | ICD-10-CM

## 2018-12-01 DIAGNOSIS — R0689 Other abnormalities of breathing: Secondary | ICD-10-CM | POA: Diagnosis not present

## 2018-12-01 DIAGNOSIS — R911 Solitary pulmonary nodule: Secondary | ICD-10-CM | POA: Diagnosis not present

## 2018-12-13 ENCOUNTER — Encounter: Payer: Self-pay | Admitting: Sports Medicine

## 2018-12-13 DIAGNOSIS — F32A Depression, unspecified: Secondary | ICD-10-CM

## 2018-12-13 DIAGNOSIS — F419 Anxiety disorder, unspecified: Secondary | ICD-10-CM

## 2018-12-13 DIAGNOSIS — F329 Major depressive disorder, single episode, unspecified: Secondary | ICD-10-CM

## 2018-12-13 MED ORDER — VILAZODONE HCL 10 MG PO TABS: 10.0000 mg | ORAL_TABLET | Freq: Every day | ORAL | 0 refills | Status: DC

## 2018-12-13 NOTE — Assessment & Plan Note (Signed)
Having some odd symptoms of jaw clenching with Trintellix, switching to Viibryd.

## 2019-01-05 ENCOUNTER — Telehealth: Payer: Self-pay | Admitting: Sports Medicine

## 2019-01-05 NOTE — Telephone Encounter (Signed)
Additional Information Required (Viibryd) This medication may be excluded from the patient's benefit. For more information, please reach out to [employer] directly at [phone]. Message from [employer]: Drug is not covered by plan. Patient reports that she was on Sertraline in the past. Can you write an appeal letter. Please advise.

## 2019-01-06 NOTE — Telephone Encounter (Signed)
Appeal letter in chart

## 2019-01-10 NOTE — Telephone Encounter (Signed)
Appeal letter has been faxed to insurance. Waiting on a response.

## 2019-01-13 NOTE — Telephone Encounter (Signed)
Insurance is requesting more information for approval of the medication.

## 2019-01-19 NOTE — Telephone Encounter (Signed)
Patient called and is going to check with her insurance about the appeal. She will let me know if she has any questions.

## 2019-01-19 NOTE — Telephone Encounter (Signed)
Pharmacy is aware we are still working to get the PA approved from [employer].

## 2019-01-19 NOTE — Telephone Encounter (Signed)
I called [employer] and spoke with [facility] and she states that a new form has been updated on their side that was not on covermymeds. Viibryd has been approved from 12/20/2018 through 01/19/2020. Pharmacy aware. Patient was also advised and did not have any questions.

## 2019-01-28 ENCOUNTER — Encounter: Payer: Self-pay | Admitting: Sports Medicine

## 2019-03-04 ENCOUNTER — Encounter: Payer: Self-pay | Admitting: Sports Medicine

## 2019-03-04 DIAGNOSIS — F419 Anxiety disorder, unspecified: Secondary | ICD-10-CM

## 2019-03-04 MED ORDER — VIIBRYD 40 MG PO TABS: 40.0000 mg | ORAL_TABLET | Freq: Every day | ORAL | 3 refills | Status: DC

## 2019-03-06 ENCOUNTER — Other Ambulatory Visit: Payer: Self-pay

## 2019-03-06 ENCOUNTER — Ambulatory Visit
Admission: RE | Admit: 2019-03-06 | Discharge: 2019-03-06 | Disposition: A | Discharge status: Home / Self Care | Payer: BLUE CROSS/BLUE SHIELD | Source: Ambulatory Visit

## 2019-03-06 ENCOUNTER — Ambulatory Visit (HOSPITAL_COMMUNITY)
Admission: EM | Admit: 2019-03-06 | Discharge: 2019-03-06 | Disposition: A | Discharge status: Home / Self Care | Payer: BC Managed Care – PPO | Attending: Family Medicine | Admitting: Family Medicine

## 2019-03-06 ENCOUNTER — Ambulatory Visit (INDEPENDENT_AMBULATORY_CARE_PROVIDER_SITE_OTHER): Payer: BC Managed Care – PPO

## 2019-03-06 ENCOUNTER — Encounter (HOSPITAL_COMMUNITY): Payer: Self-pay

## 2019-03-06 DIAGNOSIS — R0602 Shortness of breath: Secondary | ICD-10-CM

## 2019-03-06 DIAGNOSIS — J90 Pleural effusion, not elsewhere classified: Secondary | ICD-10-CM | POA: Diagnosis not present

## 2019-03-06 DIAGNOSIS — R05 Cough: Secondary | ICD-10-CM | POA: Diagnosis not present

## 2019-03-06 DIAGNOSIS — R42 Dizziness and giddiness: Secondary | ICD-10-CM

## 2019-03-06 LAB — BRAIN NATRIURETIC PEPTIDE: B Natriuretic Peptide: 711.1 pg/mL — ABNORMAL HIGH (ref 0.0–100.0)

## 2019-03-06 LAB — COMPREHENSIVE METABOLIC PANEL
ALT: 31 U/L (ref 0–44)
AST: 31 U/L (ref 15–41)
Albumin: 3.8 g/dL (ref 3.5–5.0)
Alkaline Phosphatase: 91 U/L (ref 38–126)
Anion gap: 11 (ref 5–15)
BUN: 18 mg/dL (ref 6–20)
CO2: 21 mmol/L — ABNORMAL LOW (ref 22–32)
Calcium: 8.9 mg/dL (ref 8.9–10.3)
Chloride: 107 mmol/L (ref 98–111)
Creatinine, Ser: 0.98 mg/dL (ref 0.44–1.00)
GFR calc Af Amer: >60 mL/min (ref >60)
GFR calc non Af Amer: >60 mL/min (ref >60)
Glucose, Bld: 92 mg/dL (ref 70–99)
Potassium: 4.1 mmol/L (ref 3.5–5.1)
Sodium: 139 mmol/L (ref 135–145)
Total Bilirubin: 0.6 mg/dL (ref 0.3–1.2)
Total Protein: 7 g/dL (ref 6.5–8.1)

## 2019-03-06 LAB — CBC
HCT: 37.7 % (ref 36.0–46.0)
Hemoglobin: 12.7 g/dL (ref 12.0–15.0)
MCH: 30.9 pg (ref 26.0–34.0)
MCHC: 33.7 g/dL (ref 30.0–36.0)
MCV: 91.7 fL (ref 80.0–100.0)
Platelets: 418 10*3/uL — ABNORMAL HIGH (ref 150–400)
RBC: 4.11 MIL/uL (ref 3.87–5.11)
RDW: 13.8 % (ref 11.5–15.5)
WBC: 7.8 10*3/uL (ref 4.0–10.5)
nRBC: 0 % (ref 0.0–0.2)

## 2019-03-06 NOTE — ED Triage Notes (Signed)
Pt cc cough and some shortness of breathe. Pt had a virtual visit with PA  Amy Tasia Catchings today.

## 2019-03-06 NOTE — ED Provider Notes (Signed)
[place] [facility]    CSN: [id number] Arrival date & time: 03/06/19  1618      History   Chief Complaint Chief Complaint  Patient presents with  . Cough    HPI AZARIA STEGMAN is a 48 y.o. female.   48 year old female comes into clinic for further evaluation after video visit earlier today. See below for HPI documented on video visit.  History of Present Illness:Bryndle VANECIA LIMPERT  is a 48 y.o. female presents with 1 day history of labored breathing that was more significant at nighttime, dizziness.  Patient states has not had shortness of breath, but felt harder to breathe since last night.  She has a pulse ox at home, and has been ranging around 92 to 95%.  States may go up to 97% with deep breathing.  She notices improvement of pulse ox during exertion.  Notices mild chest tightness with mild cough.  She has postnasal drip without rhinorrhea, nasal congestion.  She notes that the shortness of breath is worse with laying down, improves with Flonase and Zyrtec.  She denies chest pain, orthopnea, leg swelling.  Denies long hours of travel.  Denies urinary symptoms such as frequency, dysuria, hematuria.  She has intermittent dizziness where she has spinning sensations, this usually occurs after standing and walking.  Denies head injury, one-sided weakness, syncope.  Denies fever, chills, body aches.  She is currently working from home, and no obvious sick contacts/COVID contact.  Patient with history of PE, spontaneous pneumothorax, Hodgkin's lymphoma, thyroid cancer, breast cancer in remission.     Past Medical History:  Diagnosis Date  . Allergy   . Anxiety   . Breast cancer ([place])   . 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 Cancer ([place]) 1986   Hodgkins lymphoma  . Carcinoma of thyroid ([place])   . Family history of breast cancer   . Family history of colon cancer   . Family history of prostate cancer   . GERD (gastroesophageal reflux disease)   . History of chicken  pox   . Hodgkin's disease   . Hx: UTI (urinary tract infection)   . Increased frequency of headaches   . Left breast mass   . Migraines   . PONV (postoperative nausea and vomiting)   . Primary spontaneous pneumothorax 1990   left  . Pulmonary embolism ([place]) 11/30/2015  . Recurrent spontaneous pneumothorax 1991   left  . Skin cancer 2003   Melanoma  . Thyroid disease     Patient Active Problem List   Diagnosis Date Noted  . Abnormal breath sounds 11/24/2018  . Memory loss 06/25/2018  . Neck pain 03/25/2018  . Pneumothorax on left 12/13/2017  . Hypothyroidism 12/13/2017  . GERD (gastroesophageal reflux disease) 12/13/2017  . Anxiety 12/13/2017  . Annual physical exam 11/27/2017  . Right wrist pain 11/12/2017  . Rotator cuff syndrome of both shoulders 07/28/2017  . Pulmonary embolism ([place]) 01/29/2016  . Liver lesion 01/03/2016  . Family history of breast cancer   . Family history of prostate cancer   . Family history of colon cancer   . Skin cancer   . Genetic testing 11/20/2015  . Ganglion cyst of flexor tendon sheath of finger of left hand 11/19/2015  . Breast cancer of lower-outer quadrant of right female breast ([place]) 09/22/2015  . 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 Peroneus longus tear 11/24/2013  . Toe swelling 09/06/2013  . Melanoma ([place]) 06/20/2011  .  Hodgkin's lymphoma ([place]) 06/20/2011  . Migraine 06/20/2011  . Malignant neoplasm of thyroid gland ([place]) 06/20/2011  . Hyperglycemia 06/20/2011  . Hirsutism 06/20/2011    Past Surgical History:  Procedure Laterality Date  . ABDOMINAL HYSTERECTOMY    . CHEST TUBE INSERTION  1990   collasped lung, spontaneous  . FOOT TENDON SURGERY Left   . LAPAROSCOPIC SPLENECTOMY  1987   Hodgkins Disease  . MASTECTOMY     BIL  . THORACOTOMY Left 1991   recurrent spontaneous pneumothorax - Dr Truman Hayward  . THYROIDECTOMY  2004   nodules, carcinoma  . URETEROPLASTY  1977   malformed ureter     OB History   No obstetric history on file.      Home Medications    Prior to Admission medications   Medication Sig Start Date End Date Taking? Authorizing Provider  AMBULATORY NON FORMULARY MEDICATION Myofascial release therapy and massage therapy 2-3 times a week as needed 09/22/17   [name], MD  anastrozole (ARIMIDEX) 1 MG tablet TAKE 1 TABLET(1 MG) BY MOUTH DAILY 09/20/18   [name], MD  Biotin 2500 MCG CAPS Take 5,000 mcg by mouth daily. 03/23/17   [name], MD  buPROPion HCl ER, XL, 450 MG TB24 Take 450 mg by mouth daily. 04/15/18   [name], MD  CALCIUM PO Take 1,200 mg by mouth daily.     [provider]  cetirizine (ZYRTEC) 10 MG tablet Take 1 tablet (10 mg total) by mouth daily. 04/15/18   [name], MD  Cholecalciferol (VITAMIN D3) 10000 UNITS capsule Take 10,000 Units by mouth daily. Only 5 days a week     [provider]  COLLAGEN PO Take by mouth daily.    [provider]  diclofenac sodium (VOLTAREN) 1 % GEL Apply 2 g topically 4 (four) times daily. To affected joint. 07/02/18   Gregor Hams, MD  esomeprazole (NEXIUM) 40 MG capsule Take 1 capsule (40 mg total) by mouth daily at 12 noon. 04/15/18   [name], MD  fexofenadine-pseudoephedrine (ALLEGRA-D 24) 180-240 MG 24 hr tablet Take 1 tablet by mouth daily.    [provider]  fluticasone (FLONASE) 50 MCG/ACT nasal spray One spray in each nostril twice a day, use left hand for right nostril, and right hand for left nostril. 04/15/18   [name], MD  levothyroxine (SYNTHROID, LEVOTHROID) 175 MCG tablet Take 1 tablet (175 mcg total) by mouth daily. 05/28/18   [name], MD  naproxen sodium (ALEVE) 220 MG tablet Take 220 mg by mouth 2 (two) times daily as needed (pain).    [provider]  Prasterone (INTRAROSA) 6.5 MG INST Place 1 application vaginally daily. 09/21/17   Gardenia Phlegm, NP   tetrahydrozoline 0.05 % ophthalmic solution Place 1 drop into both eyes as needed (dry eyes).    [provider]  Turmeric (CURCUMIN 95 PO) Take 65 mg by mouth daily.    [provider]  Vilazodone HCl (VIIBRYD) 40 MG TABS Take 1 tablet (40 mg total) by mouth daily. 03/04/19   [name], MD  zolmitriptan (ZOMIG) 5 MG tablet Take 1 tablet (5 mg total) by mouth as needed. 04/15/18   [name], MD    Family History Family History  Problem Relation Age of Onset  . Prostate cancer Maternal Uncle 72  . Colon cancer Maternal Grandmother        dx in her 79s  .  Prostate cancer Maternal Grandfather        dx in his 39s  . Cancer Other        FH of Breast Cancer, Ovarian/Uterine Cancer  . Heart disease Maternal Uncle        59s  . Breast cancer Other        MGFs sister - reportedly BRCA+  . Breast cancer Other        MGMs sister  . Breast cancer Other        MGFs mother    Social History Social History   Tobacco Use  . Smoking status: Never Smoker  . Smokeless tobacco: Never Used  Substance Use Topics  . Alcohol use: Not Currently    Frequency: Never  . Drug use: No     Allergies   Other, Macrodantin, Pertussis vaccines, and Tape   Review of Systems Review of Systems  Reason unable to perform ROS: See HPI as above.     Physical Exam Triage Vital Signs ED Triage Vitals [03/06/19 1657]  Enc Vitals Group     BP 116/73     Pulse Rate 100     Resp 18     Temp 98.1 F (36.7 C)     Temp Source Oral     SpO2 99 %     Weight      Height      Head Circumference      Peak Flow      Pain Score      Pain Loc      Pain Edu?      Excl. in [place]?    No data found.  Updated Vital Signs BP 116/73 (BP Location: Right Arm)   Pulse 100   Temp 98.1 F (36.7 C) (Oral)   Resp 18   LMP 02/29/2016   SpO2 99%   Physical Exam Constitutional:      General: She is not in acute distress.    Appearance: Normal appearance. She is not  ill-appearing, toxic-appearing or diaphoretic.  HENT:     Head: Normocephalic and atraumatic.     Mouth/Throat:     Mouth: Mucous membranes are moist.     Pharynx: Oropharynx is clear. Uvula midline.  Neck:     Musculoskeletal: Normal range of motion and neck supple.  Cardiovascular:     Rate and Rhythm: Normal rate and regular rhythm.     Heart sounds: Normal heart sounds. No murmur. No friction rub. No gallop.   Pulmonary:     Effort: Pulmonary effort is normal. No accessory muscle usage, prolonged expiration, respiratory distress or retractions.     Comments: Lungs clear to auscultation without adventitious lung sounds. Neurological:     General: No focal deficit present.     Mental Status: She is alert and oriented to person, place, and time.      UC Treatments / Results  Labs (all labs ordered are listed, but only abnormal results are displayed) Labs Reviewed  [place] PANEL    EKG   Radiology Dg Chest 2 View  Result Date: 03/06/2019 CLINICAL DATA:  Cough and shortness of breath. EXAM: CHEST - 2 VIEW COMPARISON:  11/24/2018.  Chest CT 12/01/2018. FINDINGS: Biapical pleuroparenchymal scarring noted. Subtle probable pleural-based opacity in the peripheral left upper lung has adjacent staple line, confirmed on CT of 12/01/2018. Similar subpleural opacity noted in the peripheral right mid lung, also visible on prior CT as pleural-based scar.  Interstitial markings are diffusely coarsened with chronic features. Small left pleural effusion is new in the interval. Calcified nodal tissue in the anterior mediastinum evident on lateral film. The visualized bony structures of the thorax are intact. IMPRESSION: Interval development of a small left pleural effusion. Otherwise stable exam. Electronically Signed   By: Misty Stanley M.D.   On: 03/06/2019 16:46    Procedures Procedures (including critical care time)  Medications Ordered in  UC Medications - No data to display  Initial Impression / Assessment and Plan / UC Course  I have reviewed the triage vital signs and the nursing notes.  Pertinent labs & imaging results that were available during my care of the patient were reviewed by me and considered in my medical decision making (see chart for details).    Discussed case with Dr Raeford Razor. Will draw CBC, CMP, BNP for further evaluation. Discussed CXR results with patient. Will need further evaluation with PCP/oncologist for etiology of pleural effusion. Patient to continue to monitor symptoms. Strict return precautions given. Patient expresses understanding and agrees to plan.  Final Clinical Impressions(s) / UC Diagnoses   Final diagnoses:  Pleural effusion    ED Prescriptions    None        Ok Edwards, PA-C 03/06/19 1746

## 2019-03-06 NOTE — Discharge Instructions (Signed)
As discussed, chest xray showed small fluid in the lungs. At this time, no alarming signs as your vitals are normal. We are drawing CBC, CMP, BNP. This will evaluate for infection, kidney function, liver enzymes, heart enzyme for heart failure. Please follow up with PCP, oncology for further evaluation and management needed. If worsening symptoms, please go to the ED for further evaluation needed.

## 2019-03-06 NOTE — ED Provider Notes (Signed)
Virtual Visit via Video Note:  Stacy Chung  initiated request for Telemedicine visit with [facility] Urgent Care team. I connected with Stacy Chung  on 03/06/2019 at 3:05 PM  for a synchronized telemedicine visit using a video enabled HIPPA compliant telemedicine application. I verified that I am speaking with Stacy Chung  using two identifiers.  Jodell Cipro, PA-C  was physically located in a [facility] Urgent care site and Stacy Chung was located at a different location.   The limitations of evaluation and management by telemedicine as well as the availability of in-person appointments were discussed. Patient was informed that she  may incur a bill ( including co-pay) for this virtual visit encounter. Stacy Chung  expressed understanding and gave verbal consent to proceed with virtual visit.     History of Present Illness:Stacy Chung  is a 48 y.o. female presents with 1 day history of labored breathing that was more significant at nighttime, dizziness.  Patient states has not had shortness of breath, but felt harder to breathe since last night.  She has a pulse ox at home, and has been ranging around 92 to 95%.  States may go up to 97% with deep breathing.  She notices improvement of pulse ox during exertion.  Notices mild chest tightness with mild cough.  She has postnasal drip without rhinorrhea, nasal congestion.  She notes that the shortness of breath is worse with laying down, improves with Flonase and Zyrtec.  She denies chest pain, orthopnea, leg swelling.  Denies long hours of travel.  Denies urinary symptoms such as frequency, dysuria, hematuria.  She has intermittent dizziness where she has spinning sensations, this usually occurs after standing and walking.  Denies head injury, one-sided weakness, syncope.  Denies fever, chills, body aches.  She is currently working from home, and no obvious sick contacts/COVID contact.  Patient with history of PE, spontaneous  pneumothorax, Hodgkin's lymphoma, thyroid cancer, breast cancer in remission.   Past Medical History:  Diagnosis Date  . Allergy   . Anxiety   . Breast cancer ([place])   . 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 Cancer ([place]) 1986   Hodgkins lymphoma  . Carcinoma of thyroid ([place])   . Family history of breast cancer   . Family history of colon cancer   . Family history of prostate cancer   . GERD (gastroesophageal reflux disease)   . History of chicken pox   . Hodgkin's disease   . Hx: UTI (urinary tract infection)   . Increased frequency of headaches   . Left breast mass   . Migraines   . PONV (postoperative nausea and vomiting)   . Primary spontaneous pneumothorax 1990   left  . Pulmonary embolism ([place]) 11/30/2015  . Recurrent spontaneous pneumothorax 1991   left  . Skin cancer 2003   Melanoma  . Thyroid disease     Allergies  Allergen Reactions  . Other Rash    "bandaides"  . Macrodantin   . Pertussis Vaccines   . Tape Rash    "plastic tape"        Observations/Objective: General: Well appearing, nontoxic, no acute distress. Sitting comfortably. Head: Normocephalic, atraumatic Neck: full ROM. Eye: No conjunctival injection, eyelid swelling. EOMI. No nystagmus visualized.  ENT: Mucus membranes moist, no lip cracking. No obvious nasal drainage. Pulm: Speaking in full sentences without difficulty. Normal effort. No respiratory distress, no accessory muscle use. MSK: no leg swelling, no  tenderness to self palpation. Neuro: Normal mental status. Alert and oriented x 3. Able to stand and walk without difficulty.    Assessment and Plan: Given history of spontaneous pneumothorax, patient would like to follow-up in office for chest x-ray.  Discussion of COVID testing also discussed.  Will evaluate patient further once patient arrives in the office.  Patient in stable condition during video visit.   Follow Up Instructions:    I discussed the assessment and  treatment plan with the patient. The patient was provided an opportunity to ask questions and all were answered. The patient agreed with the plan and demonstrated an understanding of the instructions.   The patient was advised to call back or seek an in-person evaluation if the symptoms worsen or if the condition fails to improve as anticipated.  I provided 20 minutes of non-face-to-face time during this encounter.    Ok Edwards, PA-C  03/06/2019 3:05 PM         Ok Edwards, PA-C 03/06/19 1600

## 2019-03-07 ENCOUNTER — Ambulatory Visit (INDEPENDENT_AMBULATORY_CARE_PROVIDER_SITE_OTHER): Payer: BC Managed Care – PPO | Admitting: Sports Medicine

## 2019-03-07 DIAGNOSIS — J948 Other specified pleural conditions: Secondary | ICD-10-CM | POA: Diagnosis not present

## 2019-03-07 MED ORDER — AZITHROMYCIN 250 MG PO TABS: ORAL_TABLET | ORAL | 0 refills | Status: DC

## 2019-03-07 MED ORDER — DEXAMETHASONE 4 MG PO TABS: 4.0000 mg | ORAL_TABLET | Freq: Three times a day (TID) | ORAL | 0 refills | Status: DC

## 2019-03-07 MED ORDER — ALBUTEROL SULFATE HFA 108 (90 BASE) MCG/ACT IN AERS: 2.0000 | INHALATION_SPRAY | Freq: Four times a day (QID) | RESPIRATORY_TRACT | 0 refills | Status: DC | PRN

## 2019-03-07 NOTE — Progress Notes (Signed)
Virtual Visit via WebEx/MyChart   I connected with  Stacy Chung  on 03/07/19 via WebEx/MyChart/Doximity Video and verified that I am speaking with the correct person using two identifiers.   I discussed the limitations, risks, security and privacy concerns of performing an evaluation and management service by WebEx/MyChart/Doximity Video, including the higher likelihood of inaccurate diagnosis and treatment, and the availability of in person appointments.  We also discussed the likely need of an additional face to face encounter for complete and high quality delivery of care.  I also discussed with the patient that there may be a patient responsible charge related to this service. The patient expressed understanding and wishes to proceed.  Provider location is either at home or medical facility. Patient location is at their home, different from provider location. People involved in care of the patient during this telehealth encounter were myself, my nurse/medical assistant, and my front office/scheduling team member.  Subjective:    CC: Abnormal breath sounds  HPI: This is a pleasant 48 year old female with a very extensive past medical history, she has known pleural scarring, recently she developed mild shortness of breath, abnormal breath sounds, she presented to urgent care where chest x-ray showed a possible pleural effusion, she is referred to me for further evaluation and definitive treatment.  She has moderate to severe fatigue.  No fevers, chills, pulse oximetry readings have been normal.  I reviewed the past medical history, family history, social history, surgical history, and allergies today and no changes were needed.  Please see the problem list section below in epic for further details.  Past Medical History: Past Medical History:  Diagnosis Date  . Allergy   . Anxiety   . Breast cancer ([place])   . Cancer [facility]) 2003   papillary thyroid cancer  . Cancer ([place]) 1986   Hodgkins lymphoma  . Carcinoma of thyroid ([place])   . Family history of breast cancer   . Family history of colon cancer   . Family history of prostate cancer   . GERD (gastroesophageal reflux disease)   . History of chicken pox   . Hodgkin's disease   . Hx: UTI (urinary tract infection)   . Increased frequency of headaches   . Left breast mass   . Migraines   . PONV (postoperative nausea and vomiting)   . Primary spontaneous pneumothorax 1990   left  . Pulmonary embolism ([place]) 11/30/2015  . Recurrent spontaneous pneumothorax 1991   left  . Skin cancer 2003   Melanoma  . Thyroid disease    Past Surgical History: Past Surgical History:  Procedure Laterality Date  . ABDOMINAL HYSTERECTOMY    . CHEST TUBE INSERTION  1990   collasped lung, spontaneous  . FOOT TENDON SURGERY Left   . LAPAROSCOPIC SPLENECTOMY  1987   Hodgkins Disease  . MASTECTOMY     BIL  . THORACOTOMY Left 1991   recurrent spontaneous pneumothorax - Dr Truman Hayward  . THYROIDECTOMY  2004   nodules, carcinoma  . URETEROPLASTY  1977   malformed ureter   Social History: Social History   Socioeconomic History  . Marital status: Married    Spouse name: Not on file  . Number of children: 2  . Years of education: 53  . Highest education level: Not on file  Occupational History  . Occupation: Chief Strategy Officer  Social Needs  . Financial resource strain: Not on file  . Food insecurity    Worry: Not on file  Inability: Not on file  . Transportation needs    Medical: Not on file    Non-medical: Not on file  Tobacco Use  . Smoking status: Never Smoker  . Smokeless tobacco: Never Used  Substance and Sexual Activity  . Alcohol use: Not Currently    Frequency: Never  . Drug use: No  . Sexual activity: Yes    Birth control/protection: None  Lifestyle  . Physical activity    Days per week: Not on file    Minutes per session: Not on file  . Stress: Not on file  Relationships  . Social Product manager on phone: Not on file    Gets together: Not on file    Attends religious service: Not on file    Active member of club or organization: Not on file    Attends meetings of clubs or organizations: Not on file    Relationship status: Not on file  Other Topics Concern  . Not on file  Social History Narrative   Regular exercise-no   Family History: Family History  Problem Relation Age of Onset  . Prostate cancer Maternal Uncle 60  . Colon cancer Maternal Grandmother        dx in her 34s  . Prostate cancer Maternal Grandfather        dx in his 37s  . Cancer Other        FH of Breast Cancer, Ovarian/Uterine Cancer  . Heart disease Maternal Uncle        4s  . Breast cancer Other        MGFs sister - reportedly BRCA+  . Breast cancer Other        MGMs sister  . Breast cancer Other        MGFs mother   Allergies: Allergies  Allergen Reactions  . Other Rash    "bandaides"  . Macrodantin   . Pertussis Vaccines   . Tape Rash    "plastic tape"   Medications: See med rec.  Review of Systems: No fevers, chills, night sweats, weight loss, chest pain, or shortness of breath.   Objective:    General: Speaking full sentences, no audible heavy breathing.  Sounds alert and appropriately interactive.  Appears well.  Face symmetric.  Extraocular movements intact.  Pupils equal and round.  No nasal flaring or accessory muscle use visualized.  No other physical exam performed due to the non-physical nature of this visit.  Impression and Recommendations:    Pleural scarring Known pleural scarring, mild shortness of breath, abnormal breath sounds to the patient. She was seen on a virtual visit so I cannot confirm this. Adding Decadron, azithromycin, albuterol. If no better in a week she will need to touch base with her oncologist. Because she is also having marketed fatigue we are going to send her for COVID testing. She will self quarantine until the results are in.  I discussed  the above assessment and treatment plan with the patient. The patient was provided an opportunity to ask questions and all were answered. The patient agreed with the plan and demonstrated an understanding of the instructions.   The patient was advised to call back or seek an in-person evaluation if the symptoms worsen or if the condition fails to improve as anticipated.   I provided 25 minutes of non-face-to-face time during this encounter, 15 minutes of additional time was needed to gather information, review chart, records, communicate/coordinate with staff remotely, troubleshooting the multiple  errors that we get every time when trying to do video calls through the electronic medical record, WebEx, and Doximity, restart the encounter multiple times due to instability of the software, as well as complete documentation.   ___________________________________________ Gwen Her. Dianah Field, M.D., ABFM., CAQSM. Primary Care and Sports Medicine [facility]  Adjunct Professor of [employer] [facility] [employer]

## 2019-03-07 NOTE — Assessment & Plan Note (Signed)
Known pleural scarring, mild shortness of breath, abnormal breath sounds to the patient. She was seen on a virtual visit so I cannot confirm this. Adding Decadron, azithromycin, albuterol. If no better in a week she will need to touch base with her oncologist. Because she is also having marketed fatigue we are going to send her for COVID testing. She will self quarantine until the results are in.

## 2019-03-08 ENCOUNTER — Other Ambulatory Visit: Payer: Self-pay

## 2019-03-08 DIAGNOSIS — R6889 Other general symptoms and signs: Secondary | ICD-10-CM | POA: Diagnosis not present

## 2019-03-08 DIAGNOSIS — Z20822 Contact with and (suspected) exposure to covid-19: Secondary | ICD-10-CM

## 2019-03-09 ENCOUNTER — Telehealth: Payer: Self-pay | Admitting: Physician Assistant

## 2019-03-09 NOTE — Telephone Encounter (Signed)
Discussed lab results with Dr Lanny Cramp and called patient to inform of results and plan.  Patient saw her PCP 03/07/2019 and was put on dexamethasone, azithromycin, albuterol. She has had some relief on medications with no worsening on pulse ox. Discussed to inform PCP of our lab results with elevated BNP, will need further evaluation. Return precautions given. Patient expresses understanding and agrees to plan.

## 2019-03-10 LAB — NOVEL CORONAVIRUS, NAA: SARS-CoV-2, NAA: NOT DETECTED

## 2019-03-11 ENCOUNTER — Encounter: Payer: Self-pay | Admitting: Sports Medicine

## 2019-03-11 DIAGNOSIS — R7989 Other specified abnormal findings of blood chemistry: Secondary | ICD-10-CM | POA: Insufficient documentation

## 2019-03-11 NOTE — Assessment & Plan Note (Signed)
With mild shortness of breath, proceeding with echocardiogram.

## 2019-03-18 ENCOUNTER — Ambulatory Visit (HOSPITAL_BASED_OUTPATIENT_CLINIC_OR_DEPARTMENT_OTHER)
Admission: RE | Admit: 2019-03-18 | Discharge: 2019-03-18 | Disposition: A | Discharge status: Home / Self Care | Payer: BC Managed Care – PPO | Source: Ambulatory Visit | Attending: Sports Medicine | Admitting: Sports Medicine

## 2019-03-18 ENCOUNTER — Other Ambulatory Visit: Payer: Self-pay

## 2019-03-18 ENCOUNTER — Telehealth (HOSPITAL_COMMUNITY): Payer: Self-pay | Admitting: Cardiology

## 2019-03-18 ENCOUNTER — Ambulatory Visit (INDEPENDENT_AMBULATORY_CARE_PROVIDER_SITE_OTHER): Payer: BC Managed Care – PPO | Admitting: Sports Medicine

## 2019-03-18 DIAGNOSIS — I5021 Acute systolic (congestive) heart failure: Secondary | ICD-10-CM

## 2019-03-18 DIAGNOSIS — R7989 Other specified abnormal findings of blood chemistry: Secondary | ICD-10-CM | POA: Diagnosis not present

## 2019-03-18 LAB — CBC
HCT: 43.7 % (ref 35.0–45.0)
Hemoglobin: 14.3 g/dL (ref 11.7–15.5)
MCH: 30.6 pg (ref 27.0–33.0)
MCHC: 32.7 g/dL (ref 32.0–36.0)
MCV: 93.4 fL (ref 80.0–100.0)
MPV: 9.5 fL (ref 7.5–12.5)
Platelets: 468 10*3/uL — ABNORMAL HIGH (ref 140–400)
RBC: 4.68 10*6/uL (ref 3.80–5.10)
RDW: 13.6 % (ref 11.0–15.0)
WBC: 8 10*3/uL (ref 3.8–10.8)

## 2019-03-18 LAB — CK TOTAL AND CKMB (NOT AT ARMC)
CK, MB: 2.2 ng/mL (ref 0–5.0)
Relative Index: 4.6 — ABNORMAL HIGH (ref 0–4.0)
Total CK: 48 U/L (ref 29–143)

## 2019-03-18 LAB — COMPLETE METABOLIC PANEL WITH GFR
AG Ratio: 1.4 (calc) (ref 1.0–2.5)
ALT: 55 U/L — ABNORMAL HIGH (ref 6–29)
AST: 20 U/L (ref 10–35)
Albumin: 4 g/dL (ref 3.6–5.1)
Alkaline phosphatase (APISO): 102 U/L (ref 31–125)
BUN/Creatinine Ratio: 24 (calc) — ABNORMAL HIGH (ref 6–22)
BUN: 27 mg/dL — ABNORMAL HIGH (ref 7–25)
CO2: 25 mmol/L (ref 20–32)
Calcium: 9.3 mg/dL (ref 8.6–10.2)
Chloride: 104 mmol/L (ref 98–110)
Creat: 1.11 mg/dL — ABNORMAL HIGH (ref 0.50–1.10)
GFR, Est African American: 69 mL/min/{1.73_m2} (ref >=60)
GFR, Est Non African American: 60 mL/min/{1.73_m2} (ref >=60)
Globulin: 2.8 g/dL (calc) (ref 1.9–3.7)
Glucose, Bld: 96 mg/dL (ref 65–99)
Potassium: 4.6 mmol/L (ref 3.5–5.3)
Sodium: 138 mmol/L (ref 135–146)
Total Bilirubin: 0.4 mg/dL (ref 0.2–1.2)
Total Protein: 6.8 g/dL (ref 6.1–8.1)

## 2019-03-18 LAB — BRAIN NATRIURETIC PEPTIDE: Brain Natriuretic Peptide: 383 pg/mL — ABNORMAL HIGH (ref <100)

## 2019-03-18 LAB — TROPONIN I: Troponin I: <0.01 ng/mL (ref <=0.0)

## 2019-03-18 MED ORDER — FUROSEMIDE 40 MG PO TABS: 40.0000 mg | ORAL_TABLET | Freq: Every day | ORAL | 3 refills | Status: DC

## 2019-03-18 MED ORDER — POTASSIUM CHLORIDE CRYS ER 20 MEQ PO TBCR: 20.0000 meq | EXTENDED_RELEASE_TABLET | Freq: Every day | ORAL | 3 refills | Status: DC

## 2019-03-18 MED ORDER — SPIRONOLACTONE 25 MG PO TABS: 25.0000 mg | ORAL_TABLET | Freq: Every day | ORAL | 3 refills | Status: DC

## 2019-03-18 MED ORDER — RIVAROXABAN 20 MG PO TABS: 20.0000 mg | ORAL_TABLET | Freq: Every day | ORAL | 3 refills | Status: DC

## 2019-03-18 NOTE — Progress Notes (Signed)
Thanks.  Stacy Chung or Stacy Chung - Can we call her ASAP and try to overbook her for Tuesday or Wednesday?   Thanks -dan

## 2019-03-18 NOTE — Progress Notes (Signed)
  Echocardiogram 2D Echocardiogram has been performed.  Cardell Peach 03/18/2019, 10:15 AM

## 2019-03-18 NOTE — Progress Notes (Signed)
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  She does have a fairly significant history of Hodgkin's lymphoma as an adolescent treated with the ABVD regimen, she also had a history of breast cancer recently and is post TRAM flap reconstruction but never had chemotherapy.  She also had a melanoma that was locally excised and never needed systemic treatment.  She was in her usual state of health, until the past several weeks where she has started to have an increased mild shortness of breath, occasionally with exertion and sometimes when laying flat at night.  No PND.  No fevers, chills, muscle aches, body aches.  No cough, no nausea, no chest pain.  We obtained an echocardiogram after a BNP came back elevated, the results of which will be dictated below.  I reviewed the past medical history, family history, social history, surgical history, and allergies today and no changes were needed.  Please see the problem list section below in epic for further details.  Past Medical History: Past Medical History:  Diagnosis Date  . Allergy   . Anxiety   . Breast cancer ([place])   . 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 Cancer ([place]) 1986   Hodgkins lymphoma  . Carcinoma of thyroid ([place])   . Family history of breast cancer   . Family history of colon cancer   . Family history of prostate cancer   . GERD (gastroesophageal reflux disease)   . History of chicken pox   . Hodgkin's disease   . Hx: UTI (urinary tract infection)   . Increased frequency of headaches   . Left breast mass   . Migraines   . PONV (postoperative nausea and vomiting)   . Primary spontaneous pneumothorax 1990   left  . Pulmonary embolism ([place]) 11/30/2015  . Recurrent spontaneous pneumothorax 1991   left  . Skin cancer 2003   Melanoma  . Thyroid disease    Past Surgical History: Past Surgical History:  Procedure Laterality Date  . ABDOMINAL  HYSTERECTOMY    . CHEST TUBE INSERTION  1990   collasped lung, spontaneous  . FOOT TENDON SURGERY Left   . LAPAROSCOPIC SPLENECTOMY  1987   Hodgkins Disease  . MASTECTOMY     BIL  . THORACOTOMY Left 1991   recurrent spontaneous pneumothorax - Dr Truman Hayward  . THYROIDECTOMY  2004   nodules, carcinoma  . URETEROPLASTY  1977   malformed ureter   Social History: Social History   Socioeconomic History  . Marital status: Married    Spouse name: Not on file  . Number of children: 2  . Years of education: 50  . Highest education level: Not on file  Occupational History  . Occupation: Chief Strategy Officer  Social Needs  . Financial resource strain: Not on file  . Food insecurity    Worry: Not on file    Inability: Not on file  . Transportation needs    Medical: Not on file    Non-medical: Not on file  Tobacco Use  . Smoking status: Never Smoker  . Smokeless tobacco: Never Used  Substance and Sexual Activity  . Alcohol use: Not Currently    Frequency: Never  . Drug use: No  . Sexual activity: Yes    Birth control/protection: None  Lifestyle  . Physical activity    Days per week: Not on file    Minutes per session:  Not on file  . Stress: Not on file  Relationships  . Social Herbalist on phone: Not on file    Gets together: Not on file    Attends religious service: Not on file    Active member of club or organization: Not on file    Attends meetings of clubs or organizations: Not on file    Relationship status: Not on file  Other Topics Concern  . Not on file  Social History Narrative   Regular exercise-no   Family History: Family History  Problem Relation Age of Onset  . Prostate cancer Maternal Uncle 46  . Colon cancer Maternal Grandmother        dx in her 48s  . Prostate cancer Maternal Grandfather        dx in his 39s  . Cancer Other        FH of Breast Cancer, Ovarian/Uterine Cancer  . Heart disease Maternal Uncle        28s  . Breast cancer Other         MGFs sister - reportedly BRCA+  . Breast cancer Other        MGMs sister  . Breast cancer Other        MGFs mother   Allergies: Allergies  Allergen Reactions  . Other Rash    "bandaides"  . Macrodantin   . Pertussis Vaccines   . Tape Rash    "plastic tape"   Medications: See med rec.  Review of Systems: No fevers, chills, night sweats, weight loss, chest pain, or shortness of breath.   Objective:    General: Well Developed, well nourished, and in no acute distress.  Appears comfortable. Neuro: Alert and oriented x3, extra-ocular muscles intact, sensation grossly intact.  HEENT: Normocephalic, atraumatic, pupils equal round reactive to light, neck supple, no masses, no lymphadenopathy, thyroid nonpalpable.  Skin: Warm and dry, no rashes. Cardiac: Regular rate and rhythm, no gallops, she does have a 3/6 holosystolic blowing murmur heard over the precordium, no lower extremity edema.  Respiratory: Clear to auscultation bilaterally. Not using accessory muscles, speaking in full sentences no nasal flaring.  Twelve-lead ECG obtained today shows left axis deviation, rate of 94 bpm, left bundle branch block, no extrasystoles, but overall unchanged from the previous ECG in March 2020.  Her echo was severely abnormal with an ejection fraction of 20 to 25%, probable severe mitral regurgitation, and a small apical thrombus, of note her echo was overall normal with an EF of 60 to 65% in 2017.  Impression and Recommendations:    Acute systolic heart failure with apical thrombus Marycatherine appears comfortable in the office today, she is clinically stable. Her blood pressure is in the upper 01X systolic but this is where it typically runs for her. Discussed with cardiology. Ejection fraction of 20-25%, apical thrombus. She is about 8 pounds up from what I think would be her dry weight back in March. Only mild orthopnea. Symptoms of shortness of breath only with significant  exertion. Clinically stable so we will let her go through the weekend, I am going to aggressively diurese her, Lasix 40 daily, KCl 20 M EQ's daily, spironolactone 25 mg daily. We discussed Eliquis, patient prefers to go on Xarelto. We discussed indications to present to the ED at [facility]. She will see Dr. Glori Bickers in the office sometime early next week.    ___________________________________________ Gwen Her. Dianah Field, M.D., ABFM., CAQSM. Primary Care and Sports Medicine  [facility]  Adjunct Professor of [place] [employer] [facility] of Medicine

## 2019-03-18 NOTE — Telephone Encounter (Signed)
Thanks.  Heather or Donalee Gaumond - Can we call her ASAP and try to overbook her for Tuesday or Wednesday?   Thanks -dan    As requested add on appt given to patient for 9/8 @ 130 Pt given parking instructions and voiced understanding

## 2019-03-18 NOTE — Telephone Encounter (Signed)
-----   Message from Jolaine Artist, MD sent at 03/18/2019  4:28 PM EDT -----   ----- Message ----- From: [name], MD Sent: 03/18/2019   4:21 PM EDT To: Jolaine Artist, MD

## 2019-03-18 NOTE — Assessment & Plan Note (Addendum)
Stacy Chung appears comfortable in the office today, she is clinically stable. Her blood pressure is in the upper 0000000 systolic but this is where it typically runs for her. Discussed with cardiology. Ejection fraction of 20-25%, apical thrombus. She is about 8 pounds up from what I think would be her dry weight back in March. Only mild orthopnea. Symptoms of shortness of breath only with significant exertion. Clinically stable so we will let her go through the weekend, I am going to aggressively diurese her, Lasix 40 daily, KCl 20 M EQ's daily, spironolactone 25 mg daily. We discussed Eliquis, patient prefers to go on Xarelto. We discussed indications to present to the ED at [facility]. She will see Dr. Glori Bickers in the office sometime early next week.

## 2019-03-21 NOTE — Progress Notes (Signed)
ADVANCED HF CLINIC CONSULT NOTE  Referring Physician: Dr. Aundria Mems Primary Care: Dr. 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 Initially treated with tamoxifen but stopped due to PE. Letrozole 2.5 mg daily switched to anastrozole 03/23/2017 due to hot flashes and myalgias  She has had multiple PTX and underwent previous left thoracotomy for bleb resection in 1991. Underwent chest tube placement in 6/19 for recurrent spontaneous left PTX.   In October 2017 had new LBBB. Myoview at that time EF 59%.   Had been doing well but over the past several weeks developed DOE and orthopnea. No CP, palpitations or PND. Was seen by her PCP and BNP checked on 03/06/19 and was 711. Echo done on 03/18/19 showed mildly dilated LV (5.6cm) EF 20-25% with diffuse HK with apical thrombus, severe MR and mild RV dysfunction.   Was seen by Dr. Dianah Field on Friday and started on lasix 40 daily, kdur 20, spiro 25 and Eliquis. She is referred to the HF clinic by Dr. Dianah Field for further evaluation and management of new onset systolic HF.   Here with her husband for initial evaluation. She has diuresed 8 pounds of fluid over the weekend. Cough has resolved. Still with mild orthopnea. Has chronic chest heaviness. Sore if she lies on left side. SOB with mild exertion including stairs.    Review of Systems: [y] = yes, [ ]  = no   General: Weight gain [ ] ; Weight loss [ ] ; Anorexia [ ] ; Fatigue [ y]; Fever [ ] ; Chills [ ] ; Weakness [ ]   Cardiac: Chest pain/pressure Blue.Reese ];  Resting SOB [ ] ; Exertional SOB Blue.Reese ]; Orthopnea Blue.Reese ]; Pedal Edema Blue.Reese ]; Palpitations [ ] ; Syncope [ ] ; Presyncope [ ] ; Paroxysmal nocturnal dyspnea[ ]   Pulmonary: Cough Blue.Reese ]; Wheezing[ ] ; Hemoptysis[ ] ; Sputum [ ] ; Snoring [ ]   GI: Vomiting[ ] ; Dysphagia[ ] ; Melena[ ] ; Hematochezia [ ] ; Heartburn[ ] ; Abdominal pain [ ] ; Constipation [ ] ; Diarrhea [ ] ; BRBPR [ ]   GU: Hematuria[ ] ; Dysuria [ ] ; Nocturia[ ]   Vascular: Pain in legs with walking [ ] ; Pain in feet with lying flat [ ] ; Non-healing sores [ ] ; Stroke [ ] ; TIA [ ] ; Slurred speech [ ] ;  Neuro: Headaches[ ] ; Vertigo[ ] ; Seizures[ ] ; Paresthesias[ ] ;Blurred vision [ ] ; Diplopia [ ] ; Vision changes [ ]   Ortho/Skin: Arthritis [ ] ; Joint pain [ ] ; Muscle pain [ ] ; Joint swelling [ ] ; Back Pain [ ] ; Rash [ ]   Psych: Depression[ ] ; Anxiety[y ]  Heme: Bleeding problems [ ] ; Clotting disorders [ ] ; Anemia [ ]   Endocrine: Diabetes [ ] ; Thyroid dysfunction[ ]    Past Medical History:  Diagnosis Date  . Allergy   . Anxiety   . Breast cancer ([place])   . Cancer [facility]) 2003   papillary thyroid cancer  . Cancer ([place]) 1986   Hodgkins lymphoma  . Carcinoma of thyroid ([place])   . Family history of breast cancer   . Family history of colon cancer   . Family history of prostate cancer   . GERD (gastroesophageal reflux disease)   .  History of chicken pox   . Hodgkin's disease   . Hx: UTI (urinary tract infection)   . Increased frequency of headaches   . Left breast mass   . Migraines   . PONV (postoperative nausea and vomiting)   . Primary spontaneous pneumothorax 1990   left  . Pulmonary embolism ([place]) 11/30/2015  . Recurrent spontaneous pneumothorax 1991   left  . Skin cancer 2003   Melanoma  . Thyroid disease     Current Outpatient Medications  Medication Sig Dispense Refill  . albuterol (VENTOLIN HFA) 108 (90 Base) MCG/ACT inhaler Inhale 2 puffs into the lungs every 6 (six) hours as needed for wheezing. 18 g 0  . AMBULATORY NON FORMULARY  MEDICATION Myofascial release therapy and massage therapy 2-3 times a week as needed 1 each 0  . anastrozole (ARIMIDEX) 1 MG tablet TAKE 1 TABLET(1 MG) BY MOUTH DAILY 90 tablet 3  . Biotin 2500 MCG CAPS Take 5,000 mcg by mouth daily. 30 capsule   . buPROPion HCl ER, XL, 450 MG TB24 Take 450 mg by mouth daily. 90 tablet 3  . CALCIUM PO Take 1,200 mg by mouth daily.     . cetirizine (ZYRTEC) 10 MG tablet Take 1 tablet (10 mg total) by mouth daily. 90 tablet 3  . Cholecalciferol (VITAMIN D3) 10000 UNITS capsule Take 10,000 Units by mouth daily. Only 5 days a week     . COLLAGEN PO Take by mouth daily.    . diclofenac sodium (VOLTAREN) 1 % GEL Apply 2 g topically 4 (four) times daily. To affected joint. 100 g 11  . esomeprazole (NEXIUM) 40 MG capsule Take 1 capsule (40 mg total) by mouth daily at 12 noon. 90 capsule 3  . fexofenadine-pseudoephedrine (ALLEGRA-D 24) 180-240 MG 24 hr tablet Take 1 tablet by mouth daily.    . fluticasone (FLONASE) 50 MCG/ACT nasal spray One spray in each nostril twice a day, use left hand for right nostril, and right hand for left nostril. 48 g 3  . furosemide (LASIX) 40 MG tablet Take 1 tablet (40 mg total) by mouth daily. 30 tablet 3  . levothyroxine (SYNTHROID, LEVOTHROID) 175 MCG tablet Take 1 tablet (175 mcg total) by mouth daily. 90 tablet 3  . naproxen sodium (ALEVE) 220 MG tablet Take 220 mg by mouth 2 (two) times daily as needed (pain).    . potassium chloride SA (K-DUR) 20 MEQ tablet Take 1 tablet (20 mEq total) by mouth daily. 30 tablet 3  . Prasterone (INTRAROSA) 6.5 MG INST Place 1 application vaginally daily. 30 each 2  . rivaroxaban (XARELTO) 20 MG TABS tablet Take 1 tablet (20 mg total) by mouth daily with supper. Please give 15 mg tabs QS for use BID x 21 days, then 20 mg PO daily. 30 tablet 3  . spironolactone (ALDACTONE) 25 MG tablet Take 1 tablet (25 mg total) by mouth daily. 30 tablet 3  . tetrahydrozoline 0.05 % ophthalmic solution Place 1 drop into  both eyes as needed (dry eyes).    . Turmeric (CURCUMIN 95 PO) Take 65 mg by mouth daily.    Marland Kitchen zolmitriptan (ZOMIG) 5 MG tablet Take 1 tablet (5 mg total) by mouth as needed. 10 tablet 11   No current facility-administered medications for this encounter.     Allergies  Allergen Reactions  . Other Rash    "bandaides"  . Macrodantin   . Pertussis Vaccines   . Tape Rash    "plastic tape"  Social History   Socioeconomic History  . Marital status: Married    Spouse name: Not on file  . Number of children: 2  . Years of education: 27  . Highest education level: Not on file  Occupational History  . Occupation: Chief Strategy Officer  Social Needs  . Financial resource strain: Not on file  . Food insecurity    Worry: Not on file    Inability: Not on file  . Transportation needs    Medical: Not on file    Non-medical: Not on file  Tobacco Use  . Smoking status: Never Smoker  . Smokeless tobacco: Never Used  Substance and Sexual Activity  . Alcohol use: Not Currently    Frequency: Never  . Drug use: No  . Sexual activity: Yes    Birth control/protection: None  Lifestyle  . Physical activity    Days per week: Not on file    Minutes per session: Not on file  . Stress: Not on file  Relationships  . Social Herbalist on phone: Not on file    Gets together: Not on file    Attends religious service: Not on file    Active member of club or organization: Not on file    Attends meetings of clubs or organizations: Not on file    Relationship status: Not on file  . Intimate partner violence    Fear of current or ex partner: Not on file    Emotionally abused: Not on file    Physically abused: Not on file    Forced sexual activity: Not on file  Other Topics Concern  . Not on file  Social History Narrative   Regular exercise-no      Family History  Problem Relation Age of Onset  . Prostate cancer Maternal Uncle 64  . Colon cancer Maternal Grandmother         dx in her 50s  . Prostate cancer Maternal Grandfather        dx in his 24s  . Cancer Other        FH of Breast Cancer, Ovarian/Uterine Cancer  . Heart disease Maternal Uncle        62s  . Breast cancer Other        MGFs sister - reportedly BRCA+  . Breast cancer Other        MGMs sister  . Breast cancer Other        MGFs mother    Vitals:   03/22/19 1335  BP: 108/70  Pulse: 96  SpO2: 98%  Weight: 73.2 kg (161 lb 6.4 oz)    PHYSICAL EXAM: General:  Well appearing. No respiratory difficulty HEENT: normal Neck: supple. no JVD. Carotids 2+ bilat; no bruits. No lymphadenopathy or thryomegaly appreciated. Cor: PMI nondisplaced. Regular rate & rhythm. Soft MR +s3 wise split s2 Lungs: clear Abdomen: soft, nontender, nondistended. No hepatosplenomegaly. No bruits or masses. Good bowel sounds. Extremities: no cyanosis, clubbing, rash, edema Neuro: alert & oriented x 3, cranial nerves grossly intact. moves all 4 extremities w/o difficulty. Affect pleasant.  ECG: NSR 97 LBBB 148 ms   ASSESSMENT & PLAN:  1. Acute systolic HF - echo 03/20/34 EF 20-35% with diffuse HK with apical thrombus, severe MR and mild RV dysfunction.  - suspect due to adriamycin toxicity but may also be related to XRT or ischemia. LBBB less likely with QRS only 160m - NYHA II-III - Volume status ok. ReDS 29% - Continue digoxin 0.125 -  Continue spiro 25 daily - Add Entresto 24/26 bid - Switch lasix and KCL to prn only - Get cMRI - Will proceed with coronary CTA to exclude critical CAD (will use ivabradine to slow HR - d/w Dr. Meda Coffee) - place LifeVest - Discussed signs/sx to watch for  2. LV thombus - continue Xarelto  3. H/o multiple cancers - s/p ABVD-MOPP treatment in 1980  4. H/o recurrent spontaneous left PTX  - s/p previous bleb resection on left.   Total time spent 45 minutes. Over half that time spent discussing above.    Glori Bickers, MD  6:25 PM

## 2019-03-22 ENCOUNTER — Ambulatory Visit (HOSPITAL_COMMUNITY)
Admission: RE | Admit: 2019-03-22 | Discharge: 2019-03-22 | Disposition: A | Discharge status: Home / Self Care | Payer: BC Managed Care – PPO | Source: Ambulatory Visit | Attending: Internal Medicine | Admitting: Internal Medicine

## 2019-03-22 ENCOUNTER — Encounter (HOSPITAL_COMMUNITY): Payer: Self-pay | Admitting: *Deleted

## 2019-03-22 ENCOUNTER — Other Ambulatory Visit: Payer: Self-pay

## 2019-03-22 ENCOUNTER — Encounter (HOSPITAL_COMMUNITY): Payer: Self-pay | Admitting: Internal Medicine

## 2019-03-22 ENCOUNTER — Encounter: Payer: Self-pay | Admitting: Sports Medicine

## 2019-03-22 VITALS — BP 108/70 | HR 96 | Wt 161.4 lb

## 2019-03-22 DIAGNOSIS — K219 Gastro-esophageal reflux disease without esophagitis: Secondary | ICD-10-CM | POA: Insufficient documentation

## 2019-03-22 DIAGNOSIS — Z883 Allergy status to other anti-infective agents status: Secondary | ICD-10-CM | POA: Insufficient documentation

## 2019-03-22 DIAGNOSIS — Z79890 Hormone replacement therapy (postmenopausal): Secondary | ICD-10-CM | POA: Insufficient documentation

## 2019-03-22 DIAGNOSIS — I5021 Acute systolic (congestive) heart failure: Secondary | ICD-10-CM | POA: Diagnosis not present

## 2019-03-22 DIAGNOSIS — Z9013 Acquired absence of bilateral breasts and nipples: Secondary | ICD-10-CM | POA: Insufficient documentation

## 2019-03-22 DIAGNOSIS — Z79899 Other long term (current) drug therapy: Secondary | ICD-10-CM | POA: Diagnosis not present

## 2019-03-22 DIAGNOSIS — I429 Cardiomyopathy, unspecified: Secondary | ICD-10-CM | POA: Diagnosis not present

## 2019-03-22 DIAGNOSIS — Z79811 Long term (current) use of aromatase inhibitors: Secondary | ICD-10-CM | POA: Insufficient documentation

## 2019-03-22 DIAGNOSIS — Z887 Allergy status to serum and vaccine status: Secondary | ICD-10-CM | POA: Diagnosis not present

## 2019-03-22 DIAGNOSIS — Z85850 Personal history of malignant neoplasm of thyroid: Secondary | ICD-10-CM | POA: Insufficient documentation

## 2019-03-22 DIAGNOSIS — Z85820 Personal history of malignant melanoma of skin: Secondary | ICD-10-CM | POA: Diagnosis not present

## 2019-03-22 DIAGNOSIS — Z7951 Long term (current) use of inhaled steroids: Secondary | ICD-10-CM | POA: Insufficient documentation

## 2019-03-22 DIAGNOSIS — I240 Acute coronary thrombosis not resulting in myocardial infarction: Secondary | ICD-10-CM

## 2019-03-22 DIAGNOSIS — I447 Left bundle-branch block, unspecified: Secondary | ICD-10-CM | POA: Insufficient documentation

## 2019-03-22 DIAGNOSIS — I340 Nonrheumatic mitral (valve) insufficiency: Secondary | ICD-10-CM | POA: Diagnosis not present

## 2019-03-22 DIAGNOSIS — Z8571 Personal history of Hodgkin lymphoma: Secondary | ICD-10-CM | POA: Insufficient documentation

## 2019-03-22 DIAGNOSIS — Z86711 Personal history of pulmonary embolism: Secondary | ICD-10-CM | POA: Insufficient documentation

## 2019-03-22 DIAGNOSIS — I513 Intracardiac thrombosis, not elsewhere classified: Secondary | ICD-10-CM

## 2019-03-22 DIAGNOSIS — Z853 Personal history of malignant neoplasm of breast: Secondary | ICD-10-CM | POA: Diagnosis not present

## 2019-03-22 DIAGNOSIS — Z87440 Personal history of urinary (tract) infections: Secondary | ICD-10-CM | POA: Insufficient documentation

## 2019-03-22 DIAGNOSIS — I5022 Chronic systolic (congestive) heart failure: Secondary | ICD-10-CM

## 2019-03-22 DIAGNOSIS — Z7901 Long term (current) use of anticoagulants: Secondary | ICD-10-CM | POA: Insufficient documentation

## 2019-03-22 DIAGNOSIS — Z86718 Personal history of other venous thrombosis and embolism: Secondary | ICD-10-CM | POA: Insufficient documentation

## 2019-03-22 DIAGNOSIS — E890 Postprocedural hypothyroidism: Secondary | ICD-10-CM | POA: Insufficient documentation

## 2019-03-22 LAB — COMPREHENSIVE METABOLIC PANEL
ALT: 44 U/L (ref 0–44)
AST: 25 U/L (ref 15–41)
Albumin: 4.1 g/dL (ref 3.5–5.0)
Alkaline Phosphatase: 95 U/L (ref 38–126)
Anion gap: 12 (ref 5–15)
BUN: 28 mg/dL — ABNORMAL HIGH (ref 6–20)
CO2: 20 mmol/L — ABNORMAL LOW (ref 22–32)
Calcium: 9.7 mg/dL (ref 8.9–10.3)
Chloride: 104 mmol/L (ref 98–111)
Creatinine, Ser: 1.1 mg/dL — ABNORMAL HIGH (ref 0.44–1.00)
GFR calc Af Amer: >60 mL/min (ref >60)
GFR calc non Af Amer: >60 mL/min (ref >60)
Glucose, Bld: 98 mg/dL (ref 70–99)
Potassium: 4.7 mmol/L (ref 3.5–5.1)
Sodium: 136 mmol/L (ref 135–145)
Total Bilirubin: 0.6 mg/dL (ref 0.3–1.2)
Total Protein: 7.7 g/dL (ref 6.5–8.1)

## 2019-03-22 LAB — BRAIN NATRIURETIC PEPTIDE: B Natriuretic Peptide: 316.1 pg/mL — ABNORMAL HIGH (ref 0.0–100.0)

## 2019-03-22 MED ORDER — FUROSEMIDE 40 MG PO TABS: 40.0000 mg | ORAL_TABLET | Freq: Every day | ORAL | 3 refills | Status: DC | PRN

## 2019-03-22 MED ORDER — POTASSIUM CHLORIDE CRYS ER 20 MEQ PO TBCR: 20.0000 meq | EXTENDED_RELEASE_TABLET | Freq: Every day | ORAL | 3 refills | Status: DC | PRN

## 2019-03-22 MED ORDER — RIVAROXABAN 20 MG PO TABS: 20.0000 mg | ORAL_TABLET | Freq: Every day | ORAL | 3 refills | Status: DC

## 2019-03-22 MED ORDER — DIGOXIN 125 MCG PO TABS: 0.1250 mg | ORAL_TABLET | Freq: Every day | ORAL | 3 refills | Status: DC

## 2019-03-22 MED ORDER — ENTRESTO 24-26 MG PO TABS: 1.0000 | ORAL_TABLET | Freq: Two times a day (BID) | ORAL | 3 refills | Status: DC

## 2019-03-22 NOTE — Patient Instructions (Addendum)
Start Digoxin 0.125 mg daily  Start Entresto 24/26 mg Twice daily, we have provided you a 30 day free card and a co-pay card to use for this  Decrease Furosemide (Lasix) to use only AS NEEDED  Your physician has requested that you have a cardiac MRI. Cardiac MRI uses a computer to create images of your heart as its beating, producing both still and moving pictures of your heart and major blood vessels. For further information please visit [email or web address] Please follow the instruction sheet given to you today for more information.  Your physician has requested that you have cardiac CT. Cardiac computed tomography (CT) is a painless test that uses an x-ray machine to take clear, detailed pictures of your heart. For further information please visit [email or web address].[email or web address]. Please follow instruction sheet as given.  **BOTH OF THESE TEST NEED TO BE AUTHORIZED WITH YOUR INSURANCE COMPANY AND THEN YOU WILL BE CONTACTED TO SCHEDULE**  Dr Haroldine Laws has recommended you wear a [place] will contact you to schedule a time for them to come out to place this on you and educate you on this vest, this will be done this evening or early tomorrow.  Dr Haroldine Laws has recommended you stop working and remain out of work until further notice, we have provided you a note for this.  Your physician recommends that you schedule a follow-up appointment in: 2-3 weeks  CARDIAC CT INSTRUCTIONS  Please arrive at the [facility] main entrance of [facility] at xx:xx AM (30-45 minutes prior to test start time)  Proceed to the [facility] Radiology Department (First Floor).  Please follow these instructions carefully (unless otherwise directed):  On the Night Before the Test: . Be sure to Drink plenty of water, but do not exceed your fluid restriction limit . Do not consume any caffeinated/decaffeinated beverages or chocolate 12 hours prior to your test. . Do not take any antihistamines 12 hours  prior to your test. . If you take Metformin do not take 24 hours prior to test.  On the Day of the Test: . Drink plenty of water. Do not drink any water within one hour of the test. . Do not eat any food 4 hours prior to the test. . You may take your regular medications prior to the test.   . HOLD diuretics (Furosemide & Spironolactone) morning of the test.      After the Test: . Drink plenty of water. . After receiving IV contrast, you may experience a mild flushed feeling. This is normal. . On occasion, you may experience a mild rash up to 24 hours after the test. This is not dangerous. If this occurs, you can take Benadryl 25 mg and increase your fluid intake. . If you experience trouble breathing, this can be serious. If it is severe call 911 IMMEDIATELY. If it is mild, please call our office. . If you take any of these medications: Glipizide/Metformin, Avandament, Glucavance, please do not take 48 hours after completing test.   At the [facility], you and your health needs are our priority. As part of our continuing mission to provide you with exceptional heart care, we have created designated Provider Care Teams. These Care Teams include your primary Cardiologist (physician) and Advanced Practice Providers (APPs- Physician Assistants and Nurse Practitioners) who all work together to provide you with the care you need, when you need it.   You may see any of the following providers on your designated Care Team at  your next follow up: Marland Kitchen Dr Glori Bickers . Dr Loralie Champagne . Darrick Grinder, NP   Please be sure to bring in all your medications bottles to every appointment.

## 2019-03-22 NOTE — Progress Notes (Signed)
Faxed order, todays OV note, echo and demographic info to [facility].  Called rep, Dorian Pod and let her know about referral, she will keep [place] posted and let us know if they need anything further

## 2019-03-22 NOTE — Progress Notes (Signed)
ReDS Vest - 03/22/19 1400      ReDS Vest   MR   Moderate    Estimated volume prior to reading  Med    Fitting Posture  Sitting    Height Marker  Short    Ruler Value  [place] A

## 2019-03-22 NOTE — Addendum Note (Signed)
Encounter addended by: Scarlette Calico, RN on: 03/22/2019 3:29 PM  Actions taken: Pharmacy for encounter modified, Order list changed, Diagnosis association updated

## 2019-03-22 NOTE — Addendum Note (Signed)
Encounter addended by: Scarlette Calico, RN on: 03/22/2019 3:51 PM  Actions taken: Clinical Note Signed

## 2019-03-23 ENCOUNTER — Telehealth (HOSPITAL_COMMUNITY): Payer: Self-pay | Admitting: Pharmacy Technician

## 2019-03-23 ENCOUNTER — Telehealth (HOSPITAL_COMMUNITY): Payer: Self-pay | Admitting: *Deleted

## 2019-03-23 DIAGNOSIS — I420 Dilated cardiomyopathy: Secondary | ICD-10-CM | POA: Diagnosis not present

## 2019-03-23 MED ORDER — APIXABAN 5 MG PO TABS: 5.0000 mg | ORAL_TABLET | Freq: Two times a day (BID) | ORAL | 11 refills | Status: DC

## 2019-03-23 NOTE — Telephone Encounter (Signed)
Received notification that prior authorization for Eliquis is required.  PA submitted via phone [phone]) [place] [id number]  PA Approved  Dates: 02/21/2019 through 03/22/20  Co-pay: $40.00  Called [facility] and gave them the co-pay card information to bring it to $10.  BIN: [id number] PCN: [id number] GRP: [id number] ID: [id number]  Called patient about approval and co-pay card. Stated that she did not want to take two tablets a day and that her insurance has approved Xarelto before. I explained to her what happened when I attempted to complete the PA for her. She is going to call her insurance company and call us back if she is not going to go with the Eliquis.  Charlann Boxer, CPhT

## 2019-03-23 NOTE — Telephone Encounter (Signed)
Attempted to completed a PA for Xarelto, patient insurance ([employer]) states that this medication is not covered and that they prefer Eliquis and Jardiance.   Will route message to provider as well.

## 2019-03-23 NOTE — Telephone Encounter (Signed)
Switch to apixaban 5 bid. thanks

## 2019-03-23 NOTE — Telephone Encounter (Signed)
Pt's Insurance states Xarelto is not on formulary and she must try Eliquis (which is on formulary) ok for Eliquis per Dr Haroldine Laws, order placed will let pt know.

## 2019-03-24 ENCOUNTER — Encounter (HOSPITAL_COMMUNITY): Payer: Self-pay

## 2019-03-24 ENCOUNTER — Telehealth (HOSPITAL_COMMUNITY): Payer: Self-pay

## 2019-03-24 NOTE — Telephone Encounter (Signed)
Faxed Medical Records to [place], Attention Karolee Ohs @ [phone] on 03-24-2019.

## 2019-03-28 ENCOUNTER — Telehealth (HOSPITAL_COMMUNITY): Payer: Self-pay | Admitting: Emergency Medicine

## 2019-03-28 ENCOUNTER — Encounter: Payer: Self-pay | Admitting: Hematology and Oncology

## 2019-03-28 ENCOUNTER — Encounter (HOSPITAL_COMMUNITY): Payer: Self-pay

## 2019-03-28 MED ORDER — IVABRADINE HCL 7.5 MG PO TABS
7.5000 mg | ORAL_TABLET | Freq: Once | ORAL | Status: AC
  Administered 2019-03-29: 7.5 mg via ORAL
  Filled 2019-03-28: qty 1

## 2019-03-28 NOTE — Telephone Encounter (Signed)
Reaching out to patient to offer assistance regarding upcoming cardiac imaging study; pt verbalizes understanding of appt date/time, parking situation and where to check in, pre-test NPO status and medications ordered, and verified current allergies; name and call back number provided for further questions should they arise Marchia Bond RN Navigator Cardiac Imaging [facility] [phone] office [phone] cell  Pt to receive dose of 7.5mg  ivabradine PO for CCTA HR control. Instructed patient to check into radiology at 12 noon for admin at hospital. Pt verbalized understanding.

## 2019-03-29 ENCOUNTER — Telehealth (HOSPITAL_COMMUNITY): Payer: Self-pay | Admitting: *Deleted

## 2019-03-29 ENCOUNTER — Encounter (HOSPITAL_COMMUNITY): Payer: Self-pay

## 2019-03-29 ENCOUNTER — Other Ambulatory Visit: Payer: Self-pay

## 2019-03-29 ENCOUNTER — Ambulatory Visit (HOSPITAL_COMMUNITY)
Admission: RE | Admit: 2019-03-29 | Discharge: 2019-03-29 | Disposition: A | Discharge status: Home / Self Care | Payer: BC Managed Care – PPO | Source: Ambulatory Visit | Attending: Internal Medicine | Admitting: Internal Medicine

## 2019-03-29 ENCOUNTER — Ambulatory Visit (HOSPITAL_COMMUNITY): Payer: BC Managed Care – PPO

## 2019-03-29 DIAGNOSIS — I429 Cardiomyopathy, unspecified: Secondary | ICD-10-CM | POA: Diagnosis not present

## 2019-03-29 DIAGNOSIS — I5022 Chronic systolic (congestive) heart failure: Secondary | ICD-10-CM | POA: Diagnosis not present

## 2019-03-29 MED ORDER — SODIUM CHLORIDE 0.9% FLUSH: 10.0000 mL | INTRAVENOUS | Status: DC | PRN

## 2019-03-29 MED ORDER — IVABRADINE HCL 5 MG PO TABS
5.0000 mg | ORAL_TABLET | ORAL | Status: AC
  Administered 2019-03-29: 5 mg via ORAL
  Filled 2019-03-29: qty 1

## 2019-03-29 MED ORDER — METOPROLOL TARTRATE 5 MG/5ML IV SOLN
2.5000 mg | INTRAVENOUS | Status: AC | PRN
  Administered 2019-03-29 (×2): 2.5 mg via INTRAVENOUS

## 2019-03-29 MED ORDER — SODIUM CHLORIDE 0.9 % IV BOLUS
250.0000 mL | Freq: Once | INTRAVENOUS | Status: AC
  Administered 2019-03-29: 250 mL via INTRAVENOUS

## 2019-03-29 MED ORDER — SODIUM CHLORIDE 0.9% FLUSH: 10.0000 mL | Freq: Two times a day (BID) | INTRAVENOUS | Status: DC

## 2019-03-29 MED ORDER — METOPROLOL TARTRATE 5 MG/5ML IV SOLN
INTRAVENOUS | Status: AC
  Administered 2019-03-29: 2.5 mg via INTRAVENOUS
  Filled 2019-03-29: qty 5

## 2019-03-29 MED ORDER — NITROGLYCERIN 0.4 MG SL SUBL
0.8000 mg | SUBLINGUAL_TABLET | Freq: Once | SUBLINGUAL | Status: DC
  Filled 2019-03-29: qty 25

## 2019-03-29 NOTE — Telephone Encounter (Signed)
Per ins no pre cert reqd for CMRI or cardiac CT. Notification faxed to [phone] as ins plan requested.

## 2019-03-30 ENCOUNTER — Encounter (HOSPITAL_COMMUNITY): Payer: Self-pay

## 2019-03-30 ENCOUNTER — Encounter: Payer: Self-pay | Admitting: Sports Medicine

## 2019-03-31 ENCOUNTER — Telehealth (HOSPITAL_COMMUNITY): Payer: Self-pay | Admitting: *Deleted

## 2019-03-31 ENCOUNTER — Encounter (HOSPITAL_COMMUNITY): Payer: Self-pay | Admitting: *Deleted

## 2019-03-31 NOTE — Telephone Encounter (Signed)
Called and spoke with Pt. She is going to the [facility] for eval. She asked that we cancel her visit on Monday 04/04/2019 and she will keep PCP in the loop.   No further questions at this time.

## 2019-03-31 NOTE — Telephone Encounter (Signed)
Pt spoke with Dr.Bensimhon and decided to get a second opinion at the [place] [facility]. Pt requested a copy of her echo on disc. I spoke with Butch Penny in echo dept and she has the disc ready for pick up. I called pt and she is aware to come to the front desk in the heart and vascular center to pick up disc.

## 2019-04-01 ENCOUNTER — Ambulatory Visit: Payer: BC Managed Care – PPO | Admitting: Sports Medicine

## 2019-04-12 ENCOUNTER — Telehealth (HOSPITAL_COMMUNITY): Payer: Self-pay

## 2019-04-12 NOTE — Telephone Encounter (Signed)
Received a Medical records request from [place]. Faxed to [phone], Attention Vonna Drafts on 04/12/2019.

## 2019-04-13 ENCOUNTER — Encounter (HOSPITAL_COMMUNITY): Payer: BC Managed Care – PPO | Admitting: Internal Medicine

## 2019-04-18 ENCOUNTER — Encounter: Payer: Self-pay | Admitting: Sports Medicine

## 2019-04-18 ENCOUNTER — Other Ambulatory Visit: Payer: Self-pay | Admitting: Sports Medicine

## 2019-04-18 DIAGNOSIS — I5021 Acute systolic (congestive) heart failure: Secondary | ICD-10-CM

## 2019-04-18 MED ORDER — CLONAZEPAM 0.5 MG PO TABS: 0.5000 mg | ORAL_TABLET | Freq: Two times a day (BID) | ORAL | 0 refills | Status: DC

## 2019-04-20 ENCOUNTER — Other Ambulatory Visit: Payer: Self-pay | Admitting: Sports Medicine

## 2019-04-20 DIAGNOSIS — Z170 Estrogen receptor positive status [ER+]: Secondary | ICD-10-CM

## 2019-04-20 DIAGNOSIS — C50511 Malignant neoplasm of lower-outer quadrant of right female breast: Secondary | ICD-10-CM

## 2019-04-20 MED ORDER — BUPROPION HCL ER (XL) 450 MG PO TB24: 450.0000 mg | ORAL_TABLET | Freq: Every day | ORAL | 3 refills | Status: DC

## 2019-04-22 DIAGNOSIS — I420 Dilated cardiomyopathy: Secondary | ICD-10-CM | POA: Diagnosis not present

## 2019-04-25 DIAGNOSIS — I5022 Chronic systolic (congestive) heart failure: Secondary | ICD-10-CM | POA: Diagnosis not present

## 2019-04-25 DIAGNOSIS — Z8572 Personal history of non-Hodgkin lymphomas: Secondary | ICD-10-CM | POA: Diagnosis not present

## 2019-04-25 DIAGNOSIS — Z923 Personal history of irradiation: Secondary | ICD-10-CM | POA: Diagnosis not present

## 2019-04-25 DIAGNOSIS — I513 Intracardiac thrombosis, not elsewhere classified: Secondary | ICD-10-CM | POA: Diagnosis not present

## 2019-04-25 DIAGNOSIS — Z853 Personal history of malignant neoplasm of breast: Secondary | ICD-10-CM | POA: Diagnosis not present

## 2019-04-25 DIAGNOSIS — I428 Other cardiomyopathies: Secondary | ICD-10-CM | POA: Diagnosis not present

## 2019-05-04 ENCOUNTER — Telehealth: Payer: Self-pay | Admitting: Sports Medicine

## 2019-05-04 NOTE — Telephone Encounter (Addendum)
Per Pamala Hurry with [employer] the pharmacy will have to use a code of (16109) in the future to process any claims for this medicine.   Pharmacy is aware that Bupropion 450 mg  is approved and patient is also aware. No other questions.

## 2019-05-04 NOTE — Telephone Encounter (Signed)
This medication may be excluded from the patient's benefit. For more information, please reach out to [employer] directly at [phone]. Message from [employer]: Drug is not covered by plan. I have tried to run the generic medication Blase Mess per patient request) and it kicks back a plan exclusion.    Patient is aware that [employer] and [employer] are working on this matter to resolve the PA for the Bupropion 450 mg tablet. She did not have any other questions. "She reports that 5 tablets are $100".

## 2019-05-06 ENCOUNTER — Other Ambulatory Visit: Payer: Self-pay | Admitting: Sports Medicine

## 2019-05-06 ENCOUNTER — Other Ambulatory Visit: Payer: Self-pay

## 2019-05-06 ENCOUNTER — Ambulatory Visit (INDEPENDENT_AMBULATORY_CARE_PROVIDER_SITE_OTHER): Payer: BC Managed Care – PPO | Admitting: Sports Medicine

## 2019-05-06 ENCOUNTER — Encounter: Payer: Self-pay | Admitting: Sports Medicine

## 2019-05-06 DIAGNOSIS — Z170 Estrogen receptor positive status [ER+]: Secondary | ICD-10-CM

## 2019-05-06 DIAGNOSIS — M75102 Unspecified rotator cuff tear or rupture of left shoulder, not specified as traumatic: Secondary | ICD-10-CM

## 2019-05-06 DIAGNOSIS — C50511 Malignant neoplasm of lower-outer quadrant of right female breast: Secondary | ICD-10-CM

## 2019-05-06 DIAGNOSIS — M75101 Unspecified rotator cuff tear or rupture of right shoulder, not specified as traumatic: Secondary | ICD-10-CM

## 2019-05-06 MED ORDER — ONDANSETRON 8 MG PO TBDP: 8.0000 mg | ORAL_TABLET | Freq: Three times a day (TID) | ORAL | 3 refills | Status: DC | PRN

## 2019-05-06 MED ORDER — BUPROPION HCL ER (XL) 450 MG PO TB24: 450.0000 mg | ORAL_TABLET | Freq: Every day | ORAL | 3 refills | Status: DC

## 2019-05-06 MED ORDER — ZOLMITRIPTAN 5 MG PO TABS: 5.0000 mg | ORAL_TABLET | ORAL | 3 refills | Status: DC | PRN

## 2019-05-06 NOTE — Assessment & Plan Note (Signed)
Right glenohumeral injection today, last injected 11/2018. Return as needed.

## 2019-05-06 NOTE — Progress Notes (Signed)
Subjective:    CC: Right shoulder pain  HPI: Stacy Chung returns, she has glenohumeral osteoarthritis, last injection was in May, now having recurrence of pain, moderate, persistent, localized in the right shoulder without radiation.  I reviewed the past medical history, family history, social history, surgical history, and allergies today and no changes were needed.  Please see the problem list section below in epic for further details.  Past Medical History: Past Medical History:  Diagnosis Date  . Allergy   . Anxiety   . Breast cancer ([place])   . Cancer [facility]) 2003   papillary thyroid cancer  . Cancer ([place]) 1986   Hodgkins lymphoma  . Carcinoma of thyroid ([place])   . Family history of breast cancer   . Family history of colon cancer   . Family history of prostate cancer   . GERD (gastroesophageal reflux disease)   . History of chicken pox   . Hodgkin's disease   . Hx: UTI (urinary tract infection)   . Increased frequency of headaches   . Left breast mass   . Migraines   . PONV (postoperative nausea and vomiting)   . Primary spontaneous pneumothorax 1990   left  . Pulmonary embolism ([place]) 11/30/2015  . Recurrent spontaneous pneumothorax 1991   left  . Skin cancer 2003   Melanoma  . Thyroid disease    Past Surgical History: Past Surgical History:  Procedure Laterality Date  . ABDOMINAL HYSTERECTOMY    . CHEST TUBE INSERTION  1990   collasped lung, spontaneous  . FOOT TENDON SURGERY Left   . LAPAROSCOPIC SPLENECTOMY  1987   Hodgkins Disease  . MASTECTOMY     BIL  . THORACOTOMY Left 1991   recurrent spontaneous pneumothorax - Dr Truman Hayward  . THYROIDECTOMY  2004   nodules, carcinoma  . URETEROPLASTY  1977   malformed ureter   Social History: Social History   Socioeconomic History  . Marital status: Married    Spouse name: Not on file  . Number of children: 2  . Years of education: 56  . Highest education level: Not on file  Occupational History  . Occupation:  Chief Strategy Officer  Social Needs  . Financial resource strain: Not on file  . Food insecurity    Worry: Not on file    Inability: Not on file  . Transportation needs    Medical: Not on file    Non-medical: Not on file  Tobacco Use  . Smoking status: Never Smoker  . Smokeless tobacco: Never Used  Substance and Sexual Activity  . Alcohol use: Not Currently    Frequency: Never  . Drug use: No  . Sexual activity: Yes    Birth control/protection: None  Lifestyle  . Physical activity    Days per week: Not on file    Minutes per session: Not on file  . Stress: Not on file  Relationships  . Social Herbalist on phone: Not on file    Gets together: Not on file    Attends religious service: Not on file    Active member of club or organization: Not on file    Attends meetings of clubs or organizations: Not on file    Relationship status: Not on file  Other Topics Concern  . Not on file  Social History Narrative   Regular exercise-no   Family History: Family History  Problem Relation Age of Onset  . Prostate cancer Maternal Uncle 90  . Colon cancer Maternal Grandmother  dx in her 25s  . Prostate cancer Maternal Grandfather        dx in his 56s  . Cancer Other        FH of Breast Cancer, Ovarian/Uterine Cancer  . Heart disease Maternal Uncle        72s  . Breast cancer Other        MGFs sister - reportedly BRCA+  . Breast cancer Other        MGMs sister  . Breast cancer Other        MGFs mother   Allergies: Allergies  Allergen Reactions  . Other Rash    "bandaides"  . Macrodantin   . Pertussis Vaccines   . Tape Rash    "plastic tape"   Medications: See med rec.  Review of Systems: No fevers, chills, night sweats, weight loss, chest pain, or shortness of breath.   Objective:    General: Well Developed, well nourished, and in no acute distress.  Neuro: Alert and oriented x3, extra-ocular muscles intact, sensation grossly intact.  HEENT:  Normocephalic, atraumatic, pupils equal round reactive to light, neck supple, no masses, no lymphadenopathy, thyroid nonpalpable.  Skin: Warm and dry, no rashes. Cardiac: Regular rate and rhythm, no murmurs rubs or gallops, no lower extremity edema.  Respiratory: Clear to auscultation bilaterally. Not using accessory muscles, speaking in full sentences.  Procedure: Real-time Ultrasound Guided injection of the right glenohumeral joint Device: GE Logiq E  Verbal informed consent obtained.  Time-out conducted.  Noted no overlying erythema, induration, or other signs of local infection.  Skin prepped in a sterile fashion.  Local anesthesia: Topical Ethyl chloride.  With sterile technique and under real time ultrasound guidance:  1 cc Kenalog 40, 2 cc lidocaine, 2 cc bupivacaine injected easily. Completed without difficulty  Pain immediately resolved suggesting accurate placement of the medication.  Advised to call if fevers/chills, erythema, induration, drainage, or persistent bleeding.  Images permanently stored and available for review in the ultrasound unit.  Impression: Technically successful ultrasound guided injection.  Impression and Recommendations:    Rotator cuff syndrome of both shoulders Right glenohumeral injection today, last injected 11/2018. Return as needed.   ___________________________________________ Gwen Her. Dianah Field, M.D., ABFM., CAQSM. Primary Care and Sports Medicine [place] [facility]  Adjunct Professor of [place] [employer]

## 2019-05-10 DIAGNOSIS — Z7901 Long term (current) use of anticoagulants: Secondary | ICD-10-CM | POA: Diagnosis not present

## 2019-05-10 DIAGNOSIS — I513 Intracardiac thrombosis, not elsewhere classified: Secondary | ICD-10-CM | POA: Diagnosis not present

## 2019-05-10 DIAGNOSIS — Z923 Personal history of irradiation: Secondary | ICD-10-CM | POA: Diagnosis not present

## 2019-05-10 DIAGNOSIS — I82890 Acute embolism and thrombosis of other specified veins: Secondary | ICD-10-CM | POA: Diagnosis not present

## 2019-05-10 DIAGNOSIS — Z79899 Other long term (current) drug therapy: Secondary | ICD-10-CM | POA: Diagnosis not present

## 2019-05-10 DIAGNOSIS — R0602 Shortness of breath: Secondary | ICD-10-CM | POA: Diagnosis not present

## 2019-05-10 DIAGNOSIS — Z853 Personal history of malignant neoplasm of breast: Secondary | ICD-10-CM | POA: Diagnosis not present

## 2019-05-10 DIAGNOSIS — I5022 Chronic systolic (congestive) heart failure: Secondary | ICD-10-CM | POA: Diagnosis not present

## 2019-05-10 DIAGNOSIS — I447 Left bundle-branch block, unspecified: Secondary | ICD-10-CM | POA: Diagnosis not present

## 2019-05-10 DIAGNOSIS — Z8572 Personal history of non-Hodgkin lymphomas: Secondary | ICD-10-CM | POA: Diagnosis not present

## 2019-05-10 DIAGNOSIS — I959 Hypotension, unspecified: Secondary | ICD-10-CM | POA: Diagnosis not present

## 2019-05-10 DIAGNOSIS — I428 Other cardiomyopathies: Secondary | ICD-10-CM | POA: Diagnosis not present

## 2019-05-10 DIAGNOSIS — R5383 Other fatigue: Secondary | ICD-10-CM | POA: Diagnosis not present

## 2019-05-10 DIAGNOSIS — I5042 Chronic combined systolic (congestive) and diastolic (congestive) heart failure: Secondary | ICD-10-CM | POA: Diagnosis not present

## 2019-05-12 ENCOUNTER — Telehealth (HOSPITAL_COMMUNITY): Payer: Self-pay

## 2019-05-12 NOTE — Telephone Encounter (Signed)
Faxed Records to [facility] for the 3rd time to [phone] on 05/11/2019.

## 2019-05-20 ENCOUNTER — Other Ambulatory Visit: Payer: Self-pay | Admitting: Sports Medicine

## 2019-05-20 MED ORDER — ZOLMITRIPTAN 5 MG PO TABS: 5.0000 mg | ORAL_TABLET | ORAL | 3 refills | Status: DC | PRN

## 2019-05-23 DIAGNOSIS — I420 Dilated cardiomyopathy: Secondary | ICD-10-CM | POA: Diagnosis not present

## 2019-05-31 DIAGNOSIS — I5022 Chronic systolic (congestive) heart failure: Secondary | ICD-10-CM | POA: Diagnosis not present

## 2019-06-16 DIAGNOSIS — I513 Intracardiac thrombosis, not elsewhere classified: Secondary | ICD-10-CM | POA: Diagnosis not present

## 2019-06-16 DIAGNOSIS — I240 Acute coronary thrombosis not resulting in myocardial infarction: Secondary | ICD-10-CM | POA: Diagnosis not present

## 2019-06-16 DIAGNOSIS — R0602 Shortness of breath: Secondary | ICD-10-CM | POA: Diagnosis not present

## 2019-06-16 DIAGNOSIS — Z923 Personal history of irradiation: Secondary | ICD-10-CM | POA: Diagnosis not present

## 2019-06-16 DIAGNOSIS — I5022 Chronic systolic (congestive) heart failure: Secondary | ICD-10-CM | POA: Diagnosis not present

## 2019-06-16 DIAGNOSIS — Z853 Personal history of malignant neoplasm of breast: Secondary | ICD-10-CM | POA: Diagnosis not present

## 2019-06-16 DIAGNOSIS — Z7901 Long term (current) use of anticoagulants: Secondary | ICD-10-CM | POA: Diagnosis not present

## 2019-06-16 DIAGNOSIS — I428 Other cardiomyopathies: Secondary | ICD-10-CM | POA: Diagnosis not present

## 2019-06-16 DIAGNOSIS — Z8572 Personal history of non-Hodgkin lymphomas: Secondary | ICD-10-CM | POA: Diagnosis not present

## 2019-06-16 DIAGNOSIS — I5042 Chronic combined systolic (congestive) and diastolic (congestive) heart failure: Secondary | ICD-10-CM | POA: Diagnosis not present

## 2019-06-22 DIAGNOSIS — I420 Dilated cardiomyopathy: Secondary | ICD-10-CM | POA: Diagnosis not present

## 2019-06-23 DIAGNOSIS — I959 Hypotension, unspecified: Secondary | ICD-10-CM | POA: Diagnosis not present

## 2019-06-23 DIAGNOSIS — I498 Other specified cardiac arrhythmias: Secondary | ICD-10-CM | POA: Diagnosis not present

## 2019-06-23 DIAGNOSIS — C73 Malignant neoplasm of thyroid gland: Secondary | ICD-10-CM | POA: Diagnosis not present

## 2019-06-23 DIAGNOSIS — I5022 Chronic systolic (congestive) heart failure: Secondary | ICD-10-CM | POA: Diagnosis not present

## 2019-06-23 DIAGNOSIS — K219 Gastro-esophageal reflux disease without esophagitis: Secondary | ICD-10-CM | POA: Diagnosis not present

## 2019-06-23 DIAGNOSIS — I447 Left bundle-branch block, unspecified: Secondary | ICD-10-CM | POA: Diagnosis not present

## 2019-06-23 DIAGNOSIS — Z853 Personal history of malignant neoplasm of breast: Secondary | ICD-10-CM | POA: Diagnosis not present

## 2019-06-23 DIAGNOSIS — I428 Other cardiomyopathies: Secondary | ICD-10-CM | POA: Diagnosis not present

## 2019-06-23 DIAGNOSIS — Z9221 Personal history of antineoplastic chemotherapy: Secondary | ICD-10-CM | POA: Diagnosis not present

## 2019-06-23 DIAGNOSIS — E039 Hypothyroidism, unspecified: Secondary | ICD-10-CM | POA: Diagnosis not present

## 2019-06-23 DIAGNOSIS — Z8571 Personal history of Hodgkin lymphoma: Secondary | ICD-10-CM | POA: Diagnosis not present

## 2019-06-23 DIAGNOSIS — R5383 Other fatigue: Secondary | ICD-10-CM | POA: Diagnosis not present

## 2019-06-24 DIAGNOSIS — I959 Hypotension, unspecified: Secondary | ICD-10-CM | POA: Diagnosis not present

## 2019-06-24 DIAGNOSIS — I428 Other cardiomyopathies: Secondary | ICD-10-CM | POA: Diagnosis not present

## 2019-06-24 DIAGNOSIS — K219 Gastro-esophageal reflux disease without esophagitis: Secondary | ICD-10-CM | POA: Diagnosis not present

## 2019-06-24 DIAGNOSIS — I5022 Chronic systolic (congestive) heart failure: Secondary | ICD-10-CM | POA: Diagnosis not present

## 2019-06-24 DIAGNOSIS — R5383 Other fatigue: Secondary | ICD-10-CM | POA: Diagnosis not present

## 2019-06-24 DIAGNOSIS — C73 Malignant neoplasm of thyroid gland: Secondary | ICD-10-CM | POA: Diagnosis not present

## 2019-06-24 DIAGNOSIS — I447 Left bundle-branch block, unspecified: Secondary | ICD-10-CM | POA: Diagnosis not present

## 2019-06-24 DIAGNOSIS — Z9221 Personal history of antineoplastic chemotherapy: Secondary | ICD-10-CM | POA: Diagnosis not present

## 2019-06-24 DIAGNOSIS — Z8571 Personal history of Hodgkin lymphoma: Secondary | ICD-10-CM | POA: Diagnosis not present

## 2019-06-24 DIAGNOSIS — E039 Hypothyroidism, unspecified: Secondary | ICD-10-CM | POA: Diagnosis not present

## 2019-06-24 DIAGNOSIS — Z853 Personal history of malignant neoplasm of breast: Secondary | ICD-10-CM | POA: Diagnosis not present

## 2019-07-05 DIAGNOSIS — I428 Other cardiomyopathies: Secondary | ICD-10-CM | POA: Diagnosis not present

## 2019-07-05 DIAGNOSIS — Z4502 Encounter for adjustment and management of automatic implantable cardiac defibrillator: Secondary | ICD-10-CM | POA: Diagnosis not present

## 2019-07-07 DIAGNOSIS — Z4502 Encounter for adjustment and management of automatic implantable cardiac defibrillator: Secondary | ICD-10-CM | POA: Diagnosis not present

## 2019-07-07 DIAGNOSIS — I428 Other cardiomyopathies: Secondary | ICD-10-CM | POA: Diagnosis not present

## 2019-07-14 DIAGNOSIS — R002 Palpitations: Secondary | ICD-10-CM | POA: Diagnosis not present

## 2019-07-14 DIAGNOSIS — I5022 Chronic systolic (congestive) heart failure: Secondary | ICD-10-CM | POA: Diagnosis not present

## 2019-07-14 DIAGNOSIS — R413 Other amnesia: Secondary | ICD-10-CM | POA: Diagnosis not present

## 2019-07-18 DIAGNOSIS — I5022 Chronic systolic (congestive) heart failure: Secondary | ICD-10-CM | POA: Diagnosis not present

## 2019-07-20 DIAGNOSIS — I5022 Chronic systolic (congestive) heart failure: Secondary | ICD-10-CM | POA: Diagnosis not present

## 2019-07-21 DIAGNOSIS — I5022 Chronic systolic (congestive) heart failure: Secondary | ICD-10-CM | POA: Diagnosis not present

## 2019-07-25 DIAGNOSIS — I5022 Chronic systolic (congestive) heart failure: Secondary | ICD-10-CM | POA: Diagnosis not present

## 2019-07-27 DIAGNOSIS — I5022 Chronic systolic (congestive) heart failure: Secondary | ICD-10-CM | POA: Diagnosis not present

## 2019-07-28 DIAGNOSIS — I5022 Chronic systolic (congestive) heart failure: Secondary | ICD-10-CM | POA: Diagnosis not present

## 2019-08-03 DIAGNOSIS — I5022 Chronic systolic (congestive) heart failure: Secondary | ICD-10-CM | POA: Diagnosis not present

## 2019-08-04 DIAGNOSIS — I5022 Chronic systolic (congestive) heart failure: Secondary | ICD-10-CM | POA: Diagnosis not present

## 2019-08-08 ENCOUNTER — Ambulatory Visit (INDEPENDENT_AMBULATORY_CARE_PROVIDER_SITE_OTHER): Payer: BC Managed Care – PPO | Admitting: Sports Medicine

## 2019-08-08 ENCOUNTER — Other Ambulatory Visit: Payer: Self-pay

## 2019-08-08 VITALS — BP 100/63 | HR 78 | Ht 65.0 in | Wt 164.0 lb

## 2019-08-08 DIAGNOSIS — I5021 Acute systolic (congestive) heart failure: Secondary | ICD-10-CM | POA: Diagnosis not present

## 2019-08-08 DIAGNOSIS — L7211 Pilar cyst: Secondary | ICD-10-CM | POA: Diagnosis not present

## 2019-08-08 DIAGNOSIS — I5022 Chronic systolic (congestive) heart failure: Secondary | ICD-10-CM | POA: Diagnosis not present

## 2019-08-08 MED ORDER — HYDROCODONE-ACETAMINOPHEN 5-325 MG PO TABS: 1.0000 | ORAL_TABLET | Freq: Three times a day (TID) | ORAL | 0 refills | Status: DC | PRN

## 2019-08-08 NOTE — Progress Notes (Signed)
    Procedures performed today:    Procedure:  Excision of #3, 1cm scalp pilar cysts/subcutaneous tumors Risks, benefits, and alternatives explained and consent obtained. Time out conducted. Surface prepped with alcohol. 1cc lidocaine with epinephine infiltrated in a field block and spread out between the 3 cysts. Adequate anesthesia ensured. Area prepped and draped in a sterile fashion. Excision performed with: Using a #10 blade I made a linear incision, I then removed each cyst en bloc, and closed the incisions with staples. Hemostasis achieved. Pt stable.  Independent interpretation of tests performed by another provider:   None.  Impression and Recommendations:    Pilar cyst Surgical excision of #3 pilar cyst/subcutaneous masses. Return in 1 week for staple removal. Hydrocodone for pain.  Acute systolic heart failure with apical thrombus Acute systolic heart failure, she has an ICD implanted now, ejection fraction of 20%. For the most part asymptomatic now. Currently on Entresto, metoprolol, she uses furosemide and potassium as needed. She is also on spironolactone. She has had a few episodes where her blood pressure was dropped into the Q000111Q systolic, and she has been symptomatic with orthostasis and weakness. I do think is reasonable to decrease her spironolactone to 12.5 mg rather than discontinue her Entresto or metoprolol. I would like to run this by her cardiologist first.    ___________________________________________ Gwen Her. Dianah Field, M.D., ABFM., CAQSM. Primary Care and [place] Instructor of [place] of [facility] of Medicine

## 2019-08-08 NOTE — Assessment & Plan Note (Signed)
Acute systolic heart failure, she has an ICD implanted now, ejection fraction of 20%. For the most part asymptomatic now. Currently on Entresto, metoprolol, she uses furosemide and potassium as needed. She is also on spironolactone. She has had a few episodes where her blood pressure was dropped into the Q000111Q systolic, and she has been symptomatic with orthostasis and weakness. I do think is reasonable to decrease her spironolactone to 12.5 mg rather than discontinue her Entresto or metoprolol. I would like to run this by her cardiologist first.

## 2019-08-08 NOTE — Assessment & Plan Note (Signed)
Surgical excision of #3 pilar cyst/subcutaneous masses. Return in 1 week for staple removal. Hydrocodone for pain.

## 2019-08-10 DIAGNOSIS — I5022 Chronic systolic (congestive) heart failure: Secondary | ICD-10-CM | POA: Diagnosis not present

## 2019-08-11 DIAGNOSIS — Z0130 Encounter for examination of blood pressure without abnormal findings: Secondary | ICD-10-CM | POA: Diagnosis not present

## 2019-08-11 DIAGNOSIS — I5022 Chronic systolic (congestive) heart failure: Secondary | ICD-10-CM | POA: Diagnosis not present

## 2019-08-15 DIAGNOSIS — I5022 Chronic systolic (congestive) heart failure: Secondary | ICD-10-CM | POA: Diagnosis not present

## 2019-08-17 ENCOUNTER — Ambulatory Visit (INDEPENDENT_AMBULATORY_CARE_PROVIDER_SITE_OTHER): Payer: BC Managed Care – PPO | Admitting: Sports Medicine

## 2019-08-17 ENCOUNTER — Other Ambulatory Visit: Payer: Self-pay

## 2019-08-17 DIAGNOSIS — I5021 Acute systolic (congestive) heart failure: Secondary | ICD-10-CM

## 2019-08-17 DIAGNOSIS — L7211 Pilar cyst: Secondary | ICD-10-CM

## 2019-08-17 DIAGNOSIS — I5022 Chronic systolic (congestive) heart failure: Secondary | ICD-10-CM | POA: Diagnosis not present

## 2019-08-17 NOTE — Progress Notes (Signed)
   Impression and Recommendations:    I have performed independent interpretation of the relevant labs and imaging ordered by this patient's other providers.  Pilar cyst 1 week post surgical excision of #3 scalp pilar cyst. Incisions look good, we removed staples today, return as needed.    ___________________________________________ Gwen Her. Dianah Field, M.D., ABFM., CAQSM. Primary Care and Sports Medicine [facility]  Adjunct Professor of [place] of [facility] of Medicine

## 2019-08-17 NOTE — Assessment & Plan Note (Signed)
1 week post surgical excision of #3 scalp pilar cyst. Incisions look good, we removed staples today, return as needed.

## 2019-08-18 DIAGNOSIS — I5022 Chronic systolic (congestive) heart failure: Secondary | ICD-10-CM | POA: Diagnosis not present

## 2019-08-22 DIAGNOSIS — I5022 Chronic systolic (congestive) heart failure: Secondary | ICD-10-CM | POA: Diagnosis not present

## 2019-08-22 DIAGNOSIS — Z853 Personal history of malignant neoplasm of breast: Secondary | ICD-10-CM | POA: Diagnosis not present

## 2019-08-22 DIAGNOSIS — R413 Other amnesia: Secondary | ICD-10-CM | POA: Diagnosis not present

## 2019-08-22 DIAGNOSIS — R4189 Other symptoms and signs involving cognitive functions and awareness: Secondary | ICD-10-CM | POA: Diagnosis not present

## 2019-08-25 DIAGNOSIS — Z853 Personal history of malignant neoplasm of breast: Secondary | ICD-10-CM | POA: Diagnosis not present

## 2019-08-25 DIAGNOSIS — Z8572 Personal history of non-Hodgkin lymphomas: Secondary | ICD-10-CM | POA: Diagnosis not present

## 2019-08-25 DIAGNOSIS — I5022 Chronic systolic (congestive) heart failure: Secondary | ICD-10-CM | POA: Diagnosis not present

## 2019-08-25 DIAGNOSIS — I428 Other cardiomyopathies: Secondary | ICD-10-CM | POA: Diagnosis not present

## 2019-08-29 DIAGNOSIS — G43909 Migraine, unspecified, not intractable, without status migrainosus: Secondary | ICD-10-CM | POA: Diagnosis not present

## 2019-08-29 DIAGNOSIS — Z853 Personal history of malignant neoplasm of breast: Secondary | ICD-10-CM | POA: Diagnosis not present

## 2019-08-29 DIAGNOSIS — Z79899 Other long term (current) drug therapy: Secondary | ICD-10-CM | POA: Diagnosis not present

## 2019-08-29 DIAGNOSIS — I5020 Unspecified systolic (congestive) heart failure: Secondary | ICD-10-CM | POA: Diagnosis not present

## 2019-08-29 DIAGNOSIS — K8000 Calculus of gallbladder with acute cholecystitis without obstruction: Secondary | ICD-10-CM | POA: Diagnosis not present

## 2019-08-29 DIAGNOSIS — D72829 Elevated white blood cell count, unspecified: Secondary | ICD-10-CM | POA: Diagnosis not present

## 2019-08-29 DIAGNOSIS — R1011 Right upper quadrant pain: Secondary | ICD-10-CM | POA: Diagnosis not present

## 2019-08-29 DIAGNOSIS — Z8572 Personal history of non-Hodgkin lymphomas: Secondary | ICD-10-CM | POA: Diagnosis not present

## 2019-08-29 DIAGNOSIS — I2510 Atherosclerotic heart disease of native coronary artery without angina pectoris: Secondary | ICD-10-CM | POA: Diagnosis not present

## 2019-08-29 DIAGNOSIS — R0602 Shortness of breath: Secondary | ICD-10-CM | POA: Diagnosis not present

## 2019-08-29 DIAGNOSIS — Z86711 Personal history of pulmonary embolism: Secondary | ICD-10-CM | POA: Diagnosis not present

## 2019-08-29 DIAGNOSIS — E039 Hypothyroidism, unspecified: Secondary | ICD-10-CM | POA: Diagnosis not present

## 2019-08-29 DIAGNOSIS — K8020 Calculus of gallbladder without cholecystitis without obstruction: Secondary | ICD-10-CM | POA: Diagnosis not present

## 2019-08-29 DIAGNOSIS — E890 Postprocedural hypothyroidism: Secondary | ICD-10-CM | POA: Diagnosis not present

## 2019-08-29 DIAGNOSIS — K219 Gastro-esophageal reflux disease without esophagitis: Secondary | ICD-10-CM | POA: Diagnosis not present

## 2019-08-29 DIAGNOSIS — Z79811 Long term (current) use of aromatase inhibitors: Secondary | ICD-10-CM | POA: Diagnosis not present

## 2019-08-29 DIAGNOSIS — I236 Thrombosis of atrium, auricular appendage, and ventricle as current complications following acute myocardial infarction: Secondary | ICD-10-CM | POA: Diagnosis not present

## 2019-08-29 DIAGNOSIS — I428 Other cardiomyopathies: Secondary | ICD-10-CM | POA: Diagnosis not present

## 2019-08-29 DIAGNOSIS — K810 Acute cholecystitis: Secondary | ICD-10-CM | POA: Diagnosis not present

## 2019-08-29 DIAGNOSIS — I513 Intracardiac thrombosis, not elsewhere classified: Secondary | ICD-10-CM | POA: Diagnosis not present

## 2019-08-29 DIAGNOSIS — I5022 Chronic systolic (congestive) heart failure: Secondary | ICD-10-CM | POA: Diagnosis not present

## 2019-08-29 DIAGNOSIS — Z95810 Presence of automatic (implantable) cardiac defibrillator: Secondary | ICD-10-CM | POA: Diagnosis not present

## 2019-08-29 DIAGNOSIS — K5900 Constipation, unspecified: Secondary | ICD-10-CM | POA: Diagnosis not present

## 2019-08-29 DIAGNOSIS — K8010 Calculus of gallbladder with chronic cholecystitis without obstruction: Secondary | ICD-10-CM | POA: Diagnosis not present

## 2019-08-29 DIAGNOSIS — R1013 Epigastric pain: Secondary | ICD-10-CM | POA: Diagnosis not present

## 2019-08-29 DIAGNOSIS — F419 Anxiety disorder, unspecified: Secondary | ICD-10-CM | POA: Diagnosis not present

## 2019-08-29 DIAGNOSIS — Z20822 Contact with and (suspected) exposure to covid-19: Secondary | ICD-10-CM | POA: Diagnosis not present

## 2019-08-29 DIAGNOSIS — F329 Major depressive disorder, single episode, unspecified: Secondary | ICD-10-CM | POA: Diagnosis not present

## 2019-08-29 DIAGNOSIS — Z7901 Long term (current) use of anticoagulants: Secondary | ICD-10-CM | POA: Diagnosis not present

## 2019-08-30 DIAGNOSIS — I513 Intracardiac thrombosis, not elsewhere classified: Secondary | ICD-10-CM | POA: Diagnosis not present

## 2019-08-30 DIAGNOSIS — K8020 Calculus of gallbladder without cholecystitis without obstruction: Secondary | ICD-10-CM | POA: Diagnosis not present

## 2019-08-30 DIAGNOSIS — E039 Hypothyroidism, unspecified: Secondary | ICD-10-CM | POA: Diagnosis not present

## 2019-08-30 DIAGNOSIS — Z7901 Long term (current) use of anticoagulants: Secondary | ICD-10-CM | POA: Diagnosis not present

## 2019-08-30 DIAGNOSIS — Z86711 Personal history of pulmonary embolism: Secondary | ICD-10-CM | POA: Diagnosis not present

## 2019-08-30 DIAGNOSIS — Z95810 Presence of automatic (implantable) cardiac defibrillator: Secondary | ICD-10-CM | POA: Diagnosis not present

## 2019-08-30 DIAGNOSIS — I5020 Unspecified systolic (congestive) heart failure: Secondary | ICD-10-CM | POA: Diagnosis not present

## 2019-08-30 DIAGNOSIS — I236 Thrombosis of atrium, auricular appendage, and ventricle as current complications following acute myocardial infarction: Secondary | ICD-10-CM | POA: Diagnosis not present

## 2019-08-31 DIAGNOSIS — K8020 Calculus of gallbladder without cholecystitis without obstruction: Secondary | ICD-10-CM | POA: Diagnosis not present

## 2019-08-31 DIAGNOSIS — Z86711 Personal history of pulmonary embolism: Secondary | ICD-10-CM | POA: Diagnosis not present

## 2019-08-31 DIAGNOSIS — E039 Hypothyroidism, unspecified: Secondary | ICD-10-CM | POA: Diagnosis not present

## 2019-08-31 DIAGNOSIS — D72829 Elevated white blood cell count, unspecified: Secondary | ICD-10-CM | POA: Diagnosis not present

## 2019-08-31 DIAGNOSIS — I236 Thrombosis of atrium, auricular appendage, and ventricle as current complications following acute myocardial infarction: Secondary | ICD-10-CM | POA: Diagnosis not present

## 2019-08-31 DIAGNOSIS — I428 Other cardiomyopathies: Secondary | ICD-10-CM | POA: Diagnosis not present

## 2019-08-31 DIAGNOSIS — I5020 Unspecified systolic (congestive) heart failure: Secondary | ICD-10-CM | POA: Diagnosis not present

## 2019-09-01 DIAGNOSIS — K8000 Calculus of gallbladder with acute cholecystitis without obstruction: Secondary | ICD-10-CM | POA: Diagnosis not present

## 2019-09-01 DIAGNOSIS — I236 Thrombosis of atrium, auricular appendage, and ventricle as current complications following acute myocardial infarction: Secondary | ICD-10-CM | POA: Diagnosis not present

## 2019-09-01 DIAGNOSIS — K8010 Calculus of gallbladder with chronic cholecystitis without obstruction: Secondary | ICD-10-CM | POA: Diagnosis not present

## 2019-09-01 DIAGNOSIS — K810 Acute cholecystitis: Secondary | ICD-10-CM | POA: Diagnosis not present

## 2019-09-01 DIAGNOSIS — E039 Hypothyroidism, unspecified: Secondary | ICD-10-CM | POA: Diagnosis not present

## 2019-09-01 DIAGNOSIS — Z86711 Personal history of pulmonary embolism: Secondary | ICD-10-CM | POA: Diagnosis not present

## 2019-09-01 DIAGNOSIS — K8020 Calculus of gallbladder without cholecystitis without obstruction: Secondary | ICD-10-CM | POA: Diagnosis not present

## 2019-09-02 DIAGNOSIS — Z86711 Personal history of pulmonary embolism: Secondary | ICD-10-CM | POA: Diagnosis not present

## 2019-09-02 DIAGNOSIS — E039 Hypothyroidism, unspecified: Secondary | ICD-10-CM | POA: Diagnosis not present

## 2019-09-02 DIAGNOSIS — I236 Thrombosis of atrium, auricular appendage, and ventricle as current complications following acute myocardial infarction: Secondary | ICD-10-CM | POA: Diagnosis not present

## 2019-09-02 DIAGNOSIS — K810 Acute cholecystitis: Secondary | ICD-10-CM | POA: Diagnosis not present

## 2019-09-06 ENCOUNTER — Other Ambulatory Visit: Payer: Self-pay | Admitting: Sports Medicine

## 2019-09-06 MED ORDER — CLONAZEPAM 0.5 MG PO TABS: 0.5000 mg | ORAL_TABLET | Freq: Two times a day (BID) | ORAL | 0 refills | Status: DC

## 2019-09-07 ENCOUNTER — Telehealth (HOSPITAL_COMMUNITY): Payer: Self-pay

## 2019-09-07 NOTE — Telephone Encounter (Signed)
Received request from [facility] requesting medical records from 08/15/19 on. Faxed back that the pt has not been seen in the time frame they want. Fax confirmation received.

## 2019-09-13 DIAGNOSIS — Z86718 Personal history of other venous thrombosis and embolism: Secondary | ICD-10-CM | POA: Diagnosis not present

## 2019-09-13 DIAGNOSIS — Z7901 Long term (current) use of anticoagulants: Secondary | ICD-10-CM | POA: Diagnosis not present

## 2019-09-13 DIAGNOSIS — Z923 Personal history of irradiation: Secondary | ICD-10-CM | POA: Diagnosis not present

## 2019-09-13 DIAGNOSIS — E890 Postprocedural hypothyroidism: Secondary | ICD-10-CM | POA: Diagnosis not present

## 2019-09-13 DIAGNOSIS — R413 Other amnesia: Secondary | ICD-10-CM | POA: Diagnosis not present

## 2019-09-13 DIAGNOSIS — Z85850 Personal history of malignant neoplasm of thyroid: Secondary | ICD-10-CM | POA: Diagnosis not present

## 2019-09-13 DIAGNOSIS — Z95810 Presence of automatic (implantable) cardiac defibrillator: Secondary | ICD-10-CM | POA: Diagnosis not present

## 2019-09-13 DIAGNOSIS — I447 Left bundle-branch block, unspecified: Secondary | ICD-10-CM | POA: Diagnosis not present

## 2019-09-13 DIAGNOSIS — Z09 Encounter for follow-up examination after completed treatment for conditions other than malignant neoplasm: Secondary | ICD-10-CM | POA: Diagnosis not present

## 2019-09-13 DIAGNOSIS — Z8572 Personal history of non-Hodgkin lymphomas: Secondary | ICD-10-CM | POA: Diagnosis not present

## 2019-09-13 DIAGNOSIS — Z86006 Personal history of melanoma in-situ: Secondary | ICD-10-CM | POA: Diagnosis not present

## 2019-09-13 DIAGNOSIS — K8020 Calculus of gallbladder without cholecystitis without obstruction: Secondary | ICD-10-CM | POA: Diagnosis not present

## 2019-09-13 DIAGNOSIS — I959 Hypotension, unspecified: Secondary | ICD-10-CM | POA: Diagnosis not present

## 2019-09-13 DIAGNOSIS — I5022 Chronic systolic (congestive) heart failure: Secondary | ICD-10-CM | POA: Diagnosis not present

## 2019-09-13 DIAGNOSIS — Z853 Personal history of malignant neoplasm of breast: Secondary | ICD-10-CM | POA: Diagnosis not present

## 2019-09-13 DIAGNOSIS — I428 Other cardiomyopathies: Secondary | ICD-10-CM | POA: Diagnosis not present

## 2019-09-13 DIAGNOSIS — I513 Intracardiac thrombosis, not elsewhere classified: Secondary | ICD-10-CM | POA: Diagnosis not present

## 2019-09-15 DIAGNOSIS — I5022 Chronic systolic (congestive) heart failure: Secondary | ICD-10-CM | POA: Diagnosis not present

## 2019-09-15 DIAGNOSIS — Z950 Presence of cardiac pacemaker: Secondary | ICD-10-CM | POA: Diagnosis not present

## 2019-09-19 DIAGNOSIS — I5022 Chronic systolic (congestive) heart failure: Secondary | ICD-10-CM | POA: Diagnosis not present

## 2019-09-19 DIAGNOSIS — Z950 Presence of cardiac pacemaker: Secondary | ICD-10-CM | POA: Diagnosis not present

## 2019-09-21 DIAGNOSIS — I5022 Chronic systolic (congestive) heart failure: Secondary | ICD-10-CM | POA: Diagnosis not present

## 2019-09-21 DIAGNOSIS — Z950 Presence of cardiac pacemaker: Secondary | ICD-10-CM | POA: Diagnosis not present

## 2019-09-22 DIAGNOSIS — I5022 Chronic systolic (congestive) heart failure: Secondary | ICD-10-CM | POA: Diagnosis not present

## 2019-09-22 DIAGNOSIS — Z950 Presence of cardiac pacemaker: Secondary | ICD-10-CM | POA: Diagnosis not present

## 2019-09-22 NOTE — Progress Notes (Signed)
Patient Care Team: [name], MD as PCP - General (Sports Medicine) [name], NP as Nurse Practitioner (Obstetrics and Gynecology) [name], MD as Consulting Physician (Sports Medicine) [name], MD as Consulting Physician (Hematology and Oncology) Jovita Kussmaul, MD as Consulting Physician (General Surgery) [name], NP as Nurse Practitioner (Hematology and Oncology) [name], MD as Consulting Physician (Obstetrics and Gynecology)  DIAGNOSIS:    ICD-10-CM   1. Malignant neoplasm of lower-outer quadrant of right breast of female, estrogen receptor positive ([place])  C50.511    Z17.0     SUMMARY OF ONCOLOGIC HISTORY: Oncology History  Hodgkin's lymphoma ([place])  06/21/1979 Initial Diagnosis   Stage III Hodgkin's disease with cervical, retroperitoneal lymph nodes, treated with ABVD-MOPP (alternating) followed by chest and abdominal radiation at [facility] with complete response   09/23/2001 Pathology Results   Melanoma stage I on the back treated with resection   09/27/2002 Pathology Results   Thyroid cancer treated with thyroidectomy   Breast cancer of lower-outer quadrant of right female breast ([place])  07/18/2015 Breast MRI   Rt breast mass 0.9 x 0.8 x 0.8 cm, with clumped NME 4 cm, 2 sites of Bx proven flat epithelial atypia   07/25/2015 Initial Diagnosis   Rt Breast Biopsy: IDC with calcs and DCIS and ALH, Er 100%, PR 100%, Ki 67 5%, Her 2 Neg Ratio 1.35   07/25/2015 Clinical Stage   Stage IA: T1b N0   07/25/2015 Oncotype testing   RS 6 (5% ROR)   07/26/2015 Procedure   Genetic testing revealed BRIP-1   08/28/2015 Surgery   Bilateral mastectomies with reconstruction in [place] at [place] for restorative surgery (Dr.Delacroce): Residual high-grade DCIS 1.5 cm, 0/2 sentinel nodes   09/24/2015 - 11/29/2015 Anti-estrogen oral therapy   Tamoxifen 20 mg daily. Stopped May 2017 for pulmonary embolism   10/31/2015  Survivorship   Care plan completed and mailed to patient in lieu of in person visit at her request   11/20/2015 - 11/23/2015 Hospital Admission   Hospitalization for pulmonary embolism   05/15/2016 Surgery   Hysterectomy and BSO   07/23/2016 -  Anti-estrogen oral therapy   Letrozole 2.5 mg daily switched to anastrozole 03/23/2017 due to hot flashes and myalgias   03/17/2017 PET scan   No evidence of hypermetabolic recurrent or metastatic disease. Decrease in left hepatic lobe hypermetabolism, corresponding to a benign abnormality on prior MRI ; likely focal fat; Resolved left-sided pleural effusion     CHIEF COMPLIANT: Follow-up of bilateral breast cancers on anastrozole therapy  INTERVAL HISTORY: Stacy Chung is a 49 y.o. with above-mentioned history of  bilateral breast cancers who underwent bilateral mastectomies and is currently on antiestrogen therapy with anastrozole. She presents to the clinic today for annual follow-up.  She has had a number of problems and health issues going on.  She was diagnosed with a congestive heart failure with an EF of 20% probably from prior Adriamycin therapy.  She went to [facility] clinic where she underwent heart catheterization which did not reveal any blockages.  She underwent a pacemaker implantation in December 2020.  Recent in February she had epigastric abdominal pain and was diagnosed with a cholecystitis and she underwent a cholecystectomy. She has been tolerating antiestrogen therapy relatively well.  Does not have as much muscle aches or pains as before. She is getting scheduled to undergo brain MRI because of severe cognitive dysfunction.  She cannot remember things that have happened a few minutes  previously.  She has seen a neurologist and they have ordered a brain MRI which is still pending.  ALLERGIES:  is allergic to other; macrodantin; pertussis vaccines; and tape.  MEDICATIONS:  Current Outpatient Medications  Medication Sig Dispense  Refill  . AMBULATORY NON FORMULARY MEDICATION Myofascial release therapy and massage therapy 2-3 times a week as needed 1 each 0  . anastrozole (ARIMIDEX) 1 MG tablet TAKE 1 TABLET(1 MG) BY MOUTH DAILY 90 tablet 3  . apixaban (ELIQUIS) 5 MG TABS tablet Take 1 tablet (5 mg total) by mouth 2 (two) times daily. 60 tablet 11  . buPROPion HCl ER, XL, 450 MG TB24 Take 450 mg by mouth daily. 90 tablet 3  . CALCIUM PO Take 1,200 mg by mouth daily.     . Cholecalciferol (VITAMIN D3) 10000 UNITS capsule Take 10,000 Units by mouth daily. Only 5 days a week     . clonazePAM (KLONOPIN) 0.5 MG tablet Take 1 tablet (0.5 mg total) by mouth 2 (two) times daily. For anxiety 60 tablet 0  . digoxin (LANOXIN) 0.125 MG tablet Take 1 tablet (0.125 mg total) by mouth daily. 30 tablet 3  . esomeprazole (NEXIUM) 40 MG capsule Take 1 capsule (40 mg total) by mouth daily at 12 noon. 90 capsule 3  . HYDROcodone-acetaminophen (NORCO/VICODIN) 5-325 MG tablet Take 1 tablet by mouth every 8 (eight) hours as needed for moderate pain. 15 tablet 0  . levothyroxine (SYNTHROID, LEVOTHROID) 175 MCG tablet Take 1 tablet (175 mcg total) by mouth daily. 90 tablet 3  . metoprolol succinate (TOPROL-XL) 25 MG 24 hr tablet Take 1 tablet by mouth daily.    . ondansetron (ZOFRAN-ODT) 8 MG disintegrating tablet Take 1 tablet (8 mg total) by mouth every 8 (eight) hours as needed for nausea. 60 tablet 3  . Prasterone (INTRAROSA) 6.5 MG INST Place 1 application vaginally daily. 30 each 2  . sacubitril-valsartan (ENTRESTO) 24-26 MG Take 1 tablet by mouth 2 (two) times daily.    Marland Kitchen spironolactone (ALDACTONE) 25 MG tablet Take 0.5 tablets (12.5 mg total) by mouth daily.    Marland Kitchen zolmitriptan (ZOMIG) 5 MG tablet Take 1 tablet (5 mg total) by mouth as needed. 30 tablet 3   No current facility-administered medications for this visit.    PHYSICAL EXAMINATION: ECOG PERFORMANCE STATUS: 1 - Symptomatic but completely ambulatory  There were no vitals filed  for this visit. There were no vitals filed for this visit.  BREAST: No palpable masses or nodules in either right or left breasts. No palpable axillary supraclavicular or infraclavicular adenopathy no breast tenderness or nipple discharge. (exam performed in the presence of a chaperone)  LABORATORY DATA:  I have reviewed the data as listed CMP Latest Ref Rng & Units 03/22/2019 03/18/2019 03/06/2019  Glucose 70 - 99 mg/dL 98 96 92  BUN 6 - 20 mg/dL 28(H) 27(H) 18  Creatinine 0.44 - 1.00 mg/dL 1.10(H) 1.11(H) 0.98  Sodium 135 - 145 mmol/L 136 138 139  Potassium 3.5 - 5.1 mmol/L 4.7 4.6 4.1  Chloride 98 - 111 mmol/L 104 104 107  CO2 22 - 32 mmol/L 20(L) 25 21(L)  Calcium 8.9 - 10.3 mg/dL 9.7 9.3 8.9  Total Protein 6.5 - 8.1 g/dL 7.7 6.8 7.0  Total Bilirubin 0.3 - 1.2 mg/dL 0.6 0.4 0.6  Alkaline Phos 38 - 126 U/L 95 - 91  AST 15 - 41 U/L 25 20 31   ALT 0 - 44 U/L 44 55(H) 31    Lab Results  Component Value Date   WBC 8.0 03/18/2019   HGB 14.3 03/18/2019   HCT 43.7 03/18/2019   MCV 93.4 03/18/2019   PLT 468 (H) 03/18/2019   NEUTROABS 7.7 10/06/2018    ASSESSMENT & PLAN:  Breast cancer of lower-outer quadrant of right female breast ([place]) Rt Breast mass 07/18/15: MRI breast: 0.9 x 0.8 x 0.8 cm, with clumped NME 4 cm, 2 sites of Bx proven flat epithelial atypia, T1bN0 (stage 1A) Rt Breast Biopsy 07/25/15: IDC with calcs and DCIS and ALH, Er 100%, PR 100%, Ki 67 5%, Her 2 Neg Ratio 1.35 Bilateral mastectomies 08/28/2015: In [place], 1.5 cm residual high-grade DCIS Tis N0 stage 0 Final pathologic staging: T1a N0 stage IA DX recurrence score 6, 5% risk of recurrence with tamoxifen Liver biopsy: 12/20/15: Benign  BRIP-1: This increases her risk of breast and ovarian cancer. (Patient to have oophorectomy in [place] and of June)  MRI Abd 03/20/17: Stable benign focal nodular hyperplasia in the right hepatic lobe. No evidence of metastatic disease or other acute findings. MRI Breast  03/17/17: No MR evidence of breast malignancy PET 9/4/18No evidence of hypermetabolic recurrent or metastatic disease. Decrease in left hepatic lobe hypermetabolism, corresponding to a benign abnormality on prior MRI ; likely focal fat; Resolved left-sided pleural effusion  Hospitalization 12/12/2017 for pneumothorax, third episode status post chest tube ___________________________________________________________________________  Current treatment: Tamoxifen 20 mg daily stopped due to pulmonary embolism started 09/24/2015 stopped 11/29/2015. Started letrozole 06/26/2016, then changed to Anastrozole 03/23/2017 Plan to treat her for 7 years.  Anastrozole toxicities: 1.  Hypercholesterolemia 2.  Joint aches and stiffness   Cognitive dysfunction: Very severe.  Brain MRI has been ordered and is pending.  I will request Dr. Mickeal Skinner of the neuro oncologist to see her for an assessment.  Patient was hospitalized in June 2019 for recurrent pneumothorax which required chest tube placement. CHF with pacemaker implantation December 2020  Breast cancer surveillance: 1.  Breast exam 09/23/2019: Benign 2.  No role of mammograms but she had bilateral mastectomies.  There is no way patient is able to work in her current situation.  Given her severely low cardiac reserves and her profound cognitive impairment and had generalized fatigue and weakness, patient is unfortunately unable to go back to work.  Return to clinic in 1 year for follow-up    No orders of the defined types were placed in this encounter.  The patient has a good understanding of the overall plan. she agrees with it. she will call with any problems that may develop before the next visit here.  Total time spent: 20 mins including face to face time and time spent for planning, charting and coordination of care  [name], MD 09/23/2019  I, Cloyde Reams Dorshimer, am acting as scribe for Dr. [name].  I have reviewed the above  documentation for accuracy and completeness, and I agree with the above.

## 2019-09-23 ENCOUNTER — Inpatient Hospital Stay: Payer: BC Managed Care – PPO | Attending: Hematology and Oncology | Admitting: Hematology and Oncology

## 2019-09-23 ENCOUNTER — Other Ambulatory Visit: Payer: Self-pay

## 2019-09-23 DIAGNOSIS — Z170 Estrogen receptor positive status [ER+]: Secondary | ICD-10-CM | POA: Insufficient documentation

## 2019-09-23 DIAGNOSIS — Z86711 Personal history of pulmonary embolism: Secondary | ICD-10-CM | POA: Diagnosis not present

## 2019-09-23 DIAGNOSIS — K219 Gastro-esophageal reflux disease without esophagitis: Secondary | ICD-10-CM | POA: Diagnosis not present

## 2019-09-23 DIAGNOSIS — Z8042 Family history of malignant neoplasm of prostate: Secondary | ICD-10-CM | POA: Diagnosis not present

## 2019-09-23 DIAGNOSIS — I509 Heart failure, unspecified: Secondary | ICD-10-CM | POA: Diagnosis not present

## 2019-09-23 DIAGNOSIS — Z85850 Personal history of malignant neoplasm of thyroid: Secondary | ICD-10-CM | POA: Insufficient documentation

## 2019-09-23 DIAGNOSIS — F419 Anxiety disorder, unspecified: Secondary | ICD-10-CM | POA: Diagnosis not present

## 2019-09-23 DIAGNOSIS — Z803 Family history of malignant neoplasm of breast: Secondary | ICD-10-CM | POA: Diagnosis not present

## 2019-09-23 DIAGNOSIS — Z7901 Long term (current) use of anticoagulants: Secondary | ICD-10-CM | POA: Diagnosis not present

## 2019-09-23 DIAGNOSIS — C50511 Malignant neoplasm of lower-outer quadrant of right female breast: Secondary | ICD-10-CM | POA: Diagnosis not present

## 2019-09-23 DIAGNOSIS — Z79899 Other long term (current) drug therapy: Secondary | ICD-10-CM | POA: Insufficient documentation

## 2019-09-23 DIAGNOSIS — R531 Weakness: Secondary | ICD-10-CM | POA: Diagnosis not present

## 2019-09-23 DIAGNOSIS — Z800 Family history of malignant neoplasm of digestive organs: Secondary | ICD-10-CM | POA: Diagnosis not present

## 2019-09-23 DIAGNOSIS — R4189 Other symptoms and signs involving cognitive functions and awareness: Secondary | ICD-10-CM | POA: Insufficient documentation

## 2019-09-23 DIAGNOSIS — Z79811 Long term (current) use of aromatase inhibitors: Secondary | ICD-10-CM | POA: Insufficient documentation

## 2019-09-23 DIAGNOSIS — E7800 Pure hypercholesterolemia, unspecified: Secondary | ICD-10-CM | POA: Diagnosis not present

## 2019-09-23 DIAGNOSIS — Z9013 Acquired absence of bilateral breasts and nipples: Secondary | ICD-10-CM | POA: Insufficient documentation

## 2019-09-23 DIAGNOSIS — Z8572 Personal history of non-Hodgkin lymphomas: Secondary | ICD-10-CM | POA: Insufficient documentation

## 2019-09-23 MED ORDER — METOPROLOL SUCCINATE ER 25 MG PO TB24: 37.5000 mg | ORAL_TABLET | Freq: Every day | ORAL | 11 refills | Status: DC

## 2019-09-23 MED ORDER — ANASTROZOLE 1 MG PO TABS: ORAL_TABLET | ORAL | 3 refills | Status: DC

## 2019-09-23 MED ORDER — SACUBITRIL-VALSARTAN 24-26 MG PO TABS: 0.5000 | ORAL_TABLET | Freq: Two times a day (BID) | ORAL | Status: DC

## 2019-09-23 NOTE — Assessment & Plan Note (Signed)
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 (Patient to have oophorectomy in [place] and of June)  MRI Abd 03/20/17: Stable benign focal nodular hyperplasia in the right hepatic lobe. No evidence of metastatic disease or other acute findings. MRI Breast 03/17/17: No MR evidence of breast malignancy PET 9/4/18No evidence of hypermetabolic recurrent or metastatic disease. Decrease in left hepatic lobe hypermetabolism, corresponding to a benign abnormality on prior MRI ; likely focal fat; Resolved left-sided pleural effusion  Hospitalization 12/12/2017 for pneumothorax, third episode status post chest tube ___________________________________________________________________________  Current treatment: Tamoxifen 20 mg daily stopped due to pulmonary embolism started 09/24/2015 stopped 11/29/2015. Started letrozole 06/26/2016, then changed to Anastrozole 03/23/2017 Plan to treat her for 5 years.  Anastrozole toxicities: 1.  Hypercholesterolemia 2.  Joint aches and stiffness 3.  Short-term memory loss  Patient was hospitalized in June 2019 for recurrent pneumothorax which required chest tube placement.  Breast cancer surveillance: 1.  Breast exam 09/23/2019: Benign 2.  No role of mammograms but she had bilateral mastectomies.  Return to clinic in 1 year for follow-up

## 2019-09-26 ENCOUNTER — Ambulatory Visit (INDEPENDENT_AMBULATORY_CARE_PROVIDER_SITE_OTHER): Payer: BC Managed Care – PPO | Admitting: Sports Medicine

## 2019-09-26 ENCOUNTER — Encounter: Payer: Self-pay | Admitting: Sports Medicine

## 2019-09-26 DIAGNOSIS — F329 Major depressive disorder, single episode, unspecified: Secondary | ICD-10-CM

## 2019-09-26 DIAGNOSIS — F419 Anxiety disorder, unspecified: Secondary | ICD-10-CM

## 2019-09-26 DIAGNOSIS — M75102 Unspecified rotator cuff tear or rupture of left shoulder, not specified as traumatic: Secondary | ICD-10-CM | POA: Diagnosis not present

## 2019-09-26 DIAGNOSIS — M75101 Unspecified rotator cuff tear or rupture of right shoulder, not specified as traumatic: Secondary | ICD-10-CM

## 2019-09-26 DIAGNOSIS — I5022 Chronic systolic (congestive) heart failure: Secondary | ICD-10-CM | POA: Diagnosis not present

## 2019-09-26 DIAGNOSIS — Z9049 Acquired absence of other specified parts of digestive tract: Secondary | ICD-10-CM | POA: Insufficient documentation

## 2019-09-26 DIAGNOSIS — Z950 Presence of cardiac pacemaker: Secondary | ICD-10-CM | POA: Diagnosis not present

## 2019-09-26 DIAGNOSIS — F32A Depression, unspecified: Secondary | ICD-10-CM

## 2019-09-26 MED ORDER — VORTIOXETINE HBR 5 MG PO TABS: 5.0000 mg | ORAL_TABLET | Freq: Every day | ORAL | 3 refills | Status: DC

## 2019-09-26 NOTE — Progress Notes (Signed)
    Procedures performed today:    Procedure: Real-time Ultrasound Guided injection of the right glenohumeral joint Device: Samsung [id number]  Verbal informed consent obtained.  Time-out conducted.  Noted no overlying erythema, induration, or other signs of local infection.  Skin prepped in a sterile fashion.  Local anesthesia: Topical Ethyl chloride.  With sterile technique and under real time ultrasound guidance: 1 cc Kenalog 40, 2 cc lidocaine, 2 cc bupivacaine injected easily Completed without difficulty  Pain immediately resolved suggesting accurate placement of the medication.  Advised to call if fevers/chills, erythema, induration, drainage, or persistent bleeding.  Images permanently stored and available for review in the ultrasound unit.  Impression: Technically successful ultrasound guided injection.  Independent interpretation of notes and tests performed by another provider:   None.  Impression and Recommendations:    Rotator cuff syndrome of both shoulders Repeat right glenohumeral injection today, she is having recurrence of pain, last injected in October 2020.  Anxiety and depression Stacy Chung returns, she had a fairly difficult hospitalization complicated by severe pain, choledocholithiasis with biliary colic and eventually cholecystectomy. She has been increasingly depressed, no suicidal or homicidal ideation. This is likely complicated with an adjustment disorder from Chung hospitalization. Restarting Trintellix, she had some odd symptoms of jaw clenching with Trintellix though I do not think that this was related to the medication. We agreed to restart Trintellix at 5 mg. Recheck in 6 weeks.  We can do it virtually.  Hx of cholecystectomy Stacy Chung had a bit of a difficult hospitalization complicated by severe pain, and ending in a laparoscopic cholecystectomy for choledocholithiasis, it does not seem like there is acute cholecystitis. She is doing better  now.    ___________________________________________ Stacy Chung. Stacy Chung, M.D., ABFM., CAQSM. Primary Care and [place] Instructor of [place] of [facility] of Medicine

## 2019-09-26 NOTE — Assessment & Plan Note (Signed)
Repeat right glenohumeral injection today, she is having recurrence of pain, last injected in October 2020.

## 2019-09-26 NOTE — Assessment & Plan Note (Signed)
Stacy Chung had a bit of a difficult hospitalization complicated by severe pain, and ending in a laparoscopic cholecystectomy for choledocholithiasis, it does not seem like there is acute cholecystitis. She is doing better now.

## 2019-09-26 NOTE — Assessment & Plan Note (Signed)
Stacy Chung returns, she had a fairly difficult hospitalization complicated by severe pain, choledocholithiasis with biliary colic and eventually cholecystectomy. She has been increasingly depressed, no suicidal or homicidal ideation. This is likely complicated with an adjustment disorder from her hospitalization. Restarting Trintellix, she had some odd symptoms of jaw clenching with Trintellix though I do not think that this was related to the medication. We agreed to restart Trintellix at 5 mg. Recheck in 6 weeks.  We can do it virtually.

## 2019-09-27 ENCOUNTER — Other Ambulatory Visit: Payer: Self-pay | Admitting: Radiation Therapy

## 2019-09-27 DIAGNOSIS — R413 Other amnesia: Secondary | ICD-10-CM | POA: Diagnosis not present

## 2019-09-27 DIAGNOSIS — I428 Other cardiomyopathies: Secondary | ICD-10-CM | POA: Diagnosis not present

## 2019-09-27 DIAGNOSIS — I513 Intracardiac thrombosis, not elsewhere classified: Secondary | ICD-10-CM | POA: Diagnosis not present

## 2019-09-27 DIAGNOSIS — I5022 Chronic systolic (congestive) heart failure: Secondary | ICD-10-CM | POA: Diagnosis not present

## 2019-09-27 DIAGNOSIS — Z923 Personal history of irradiation: Secondary | ICD-10-CM | POA: Diagnosis not present

## 2019-09-27 DIAGNOSIS — R002 Palpitations: Secondary | ICD-10-CM | POA: Diagnosis not present

## 2019-09-27 DIAGNOSIS — Z853 Personal history of malignant neoplasm of breast: Secondary | ICD-10-CM | POA: Diagnosis not present

## 2019-09-27 DIAGNOSIS — Z8572 Personal history of non-Hodgkin lymphomas: Secondary | ICD-10-CM | POA: Diagnosis not present

## 2019-09-28 DIAGNOSIS — I5022 Chronic systolic (congestive) heart failure: Secondary | ICD-10-CM | POA: Diagnosis not present

## 2019-09-28 DIAGNOSIS — Z950 Presence of cardiac pacemaker: Secondary | ICD-10-CM | POA: Diagnosis not present

## 2019-09-29 DIAGNOSIS — Z950 Presence of cardiac pacemaker: Secondary | ICD-10-CM | POA: Diagnosis not present

## 2019-09-29 DIAGNOSIS — I5022 Chronic systolic (congestive) heart failure: Secondary | ICD-10-CM | POA: Diagnosis not present

## 2019-10-03 DIAGNOSIS — Z950 Presence of cardiac pacemaker: Secondary | ICD-10-CM | POA: Diagnosis not present

## 2019-10-03 DIAGNOSIS — I5022 Chronic systolic (congestive) heart failure: Secondary | ICD-10-CM | POA: Diagnosis not present

## 2019-10-04 ENCOUNTER — Encounter (HOSPITAL_COMMUNITY)
Admission: RE | Admit: 2019-10-04 | Discharge: 2019-10-04 | Disposition: A | Discharge status: Home / Self Care | Payer: BC Managed Care – PPO | Source: Ambulatory Visit | Attending: Hematology and Oncology | Admitting: Hematology and Oncology

## 2019-10-04 ENCOUNTER — Other Ambulatory Visit: Payer: Self-pay

## 2019-10-04 DIAGNOSIS — C50919 Malignant neoplasm of unspecified site of unspecified female breast: Secondary | ICD-10-CM | POA: Diagnosis not present

## 2019-10-04 DIAGNOSIS — C50511 Malignant neoplasm of lower-outer quadrant of right female breast: Secondary | ICD-10-CM

## 2019-10-04 DIAGNOSIS — Z170 Estrogen receptor positive status [ER+]: Secondary | ICD-10-CM | POA: Insufficient documentation

## 2019-10-04 MED ORDER — TECHNETIUM TC 99M MEDRONATE IV KIT: 20.0000 | PACK | Freq: Once | INTRAVENOUS | Status: DC | PRN

## 2019-10-05 DIAGNOSIS — Z950 Presence of cardiac pacemaker: Secondary | ICD-10-CM | POA: Diagnosis not present

## 2019-10-05 DIAGNOSIS — I5022 Chronic systolic (congestive) heart failure: Secondary | ICD-10-CM | POA: Diagnosis not present

## 2019-10-06 ENCOUNTER — Other Ambulatory Visit: Payer: Self-pay

## 2019-10-06 ENCOUNTER — Ambulatory Visit (HOSPITAL_COMMUNITY)
Admission: RE | Admit: 2019-10-06 | Discharge: 2019-10-06 | Disposition: A | Discharge status: Home / Self Care | Payer: BC Managed Care – PPO | Source: Ambulatory Visit | Attending: Hematology and Oncology | Admitting: Hematology and Oncology

## 2019-10-06 DIAGNOSIS — Z170 Estrogen receptor positive status [ER+]: Secondary | ICD-10-CM | POA: Insufficient documentation

## 2019-10-06 DIAGNOSIS — C50511 Malignant neoplasm of lower-outer quadrant of right female breast: Secondary | ICD-10-CM | POA: Diagnosis not present

## 2019-10-06 DIAGNOSIS — C50919 Malignant neoplasm of unspecified site of unspecified female breast: Secondary | ICD-10-CM | POA: Diagnosis not present

## 2019-10-06 DIAGNOSIS — I5022 Chronic systolic (congestive) heart failure: Secondary | ICD-10-CM | POA: Diagnosis not present

## 2019-10-06 DIAGNOSIS — Z950 Presence of cardiac pacemaker: Secondary | ICD-10-CM | POA: Diagnosis not present

## 2019-10-06 MED ORDER — IOHEXOL 9 MG/ML PO SOLN
ORAL | Status: AC
  Filled 2019-10-06: qty 1000

## 2019-10-06 MED ORDER — SODIUM CHLORIDE (PF) 0.9 % IJ SOLN
INTRAMUSCULAR | Status: AC
  Filled 2019-10-06: qty 50

## 2019-10-06 MED ORDER — IOHEXOL 9 MG/ML PO SOLN
1000.0000 mL | ORAL | Status: AC
  Administered 2019-10-06: 07:00:00 1000 mL via ORAL

## 2019-10-06 MED ORDER — IOHEXOL 300 MG/ML  SOLN
100.0000 mL | Freq: Once | INTRAMUSCULAR | Status: AC | PRN
  Administered 2019-10-06: 10:00:00 100 mL via INTRAVENOUS

## 2019-10-07 ENCOUNTER — Other Ambulatory Visit: Payer: Self-pay

## 2019-10-07 ENCOUNTER — Inpatient Hospital Stay (HOSPITAL_BASED_OUTPATIENT_CLINIC_OR_DEPARTMENT_OTHER): Payer: BC Managed Care – PPO | Admitting: Internal Medicine

## 2019-10-07 ENCOUNTER — Telehealth: Payer: Self-pay

## 2019-10-07 ENCOUNTER — Encounter: Payer: Self-pay | Admitting: Hematology and Oncology

## 2019-10-07 VITALS — BP 111/67 | HR 82 | Temp 98.5°F | Resp 17 | Ht 65.0 in | Wt 157.8 lb

## 2019-10-07 DIAGNOSIS — Z7901 Long term (current) use of anticoagulants: Secondary | ICD-10-CM | POA: Diagnosis not present

## 2019-10-07 DIAGNOSIS — Z803 Family history of malignant neoplasm of breast: Secondary | ICD-10-CM | POA: Diagnosis not present

## 2019-10-07 DIAGNOSIS — R531 Weakness: Secondary | ICD-10-CM | POA: Diagnosis not present

## 2019-10-07 DIAGNOSIS — Z170 Estrogen receptor positive status [ER+]: Secondary | ICD-10-CM | POA: Diagnosis not present

## 2019-10-07 DIAGNOSIS — E7800 Pure hypercholesterolemia, unspecified: Secondary | ICD-10-CM | POA: Diagnosis not present

## 2019-10-07 DIAGNOSIS — R413 Other amnesia: Secondary | ICD-10-CM | POA: Diagnosis not present

## 2019-10-07 DIAGNOSIS — F419 Anxiety disorder, unspecified: Secondary | ICD-10-CM | POA: Diagnosis not present

## 2019-10-07 DIAGNOSIS — I509 Heart failure, unspecified: Secondary | ICD-10-CM | POA: Diagnosis not present

## 2019-10-07 DIAGNOSIS — Z86711 Personal history of pulmonary embolism: Secondary | ICD-10-CM | POA: Diagnosis not present

## 2019-10-07 DIAGNOSIS — Z79899 Other long term (current) drug therapy: Secondary | ICD-10-CM | POA: Diagnosis not present

## 2019-10-07 DIAGNOSIS — Z79811 Long term (current) use of aromatase inhibitors: Secondary | ICD-10-CM | POA: Diagnosis not present

## 2019-10-07 DIAGNOSIS — C50511 Malignant neoplasm of lower-outer quadrant of right female breast: Secondary | ICD-10-CM | POA: Diagnosis not present

## 2019-10-07 DIAGNOSIS — Z8572 Personal history of non-Hodgkin lymphomas: Secondary | ICD-10-CM | POA: Diagnosis not present

## 2019-10-07 DIAGNOSIS — Z85850 Personal history of malignant neoplasm of thyroid: Secondary | ICD-10-CM | POA: Diagnosis not present

## 2019-10-07 DIAGNOSIS — R4189 Other symptoms and signs involving cognitive functions and awareness: Secondary | ICD-10-CM | POA: Diagnosis not present

## 2019-10-07 DIAGNOSIS — K219 Gastro-esophageal reflux disease without esophagitis: Secondary | ICD-10-CM | POA: Diagnosis not present

## 2019-10-07 DIAGNOSIS — Z9013 Acquired absence of bilateral breasts and nipples: Secondary | ICD-10-CM | POA: Diagnosis not present

## 2019-10-07 NOTE — Progress Notes (Signed)
[facility] [place], [place] [address] [phone]   New Patient Evaluation  Date of Service: 10/07/19 Patient Name: Stacy Chung Patient MRN: [id number] Patient DOB: 1970/08/20 Provider: Ventura Sellers, MD  Identifying Statement:  Stacy Chung is a 49 y.o. female with memory loss who presents for initial consultation and evaluation regarding cancer associated neurologic deficits.    Referring Provider: [name], [address],  [place] [address]  Primary Cancer:  Oncologic History: Oncology History  Hodgkin's lymphoma ([place])  06/21/1979 Initial Diagnosis   Stage III Hodgkin's disease with cervical, retroperitoneal lymph nodes, treated with ABVD-MOPP (alternating) followed by chest and abdominal radiation at [facility] with complete response   09/23/2001 Pathology Results   Melanoma stage I on the back treated with resection   09/27/2002 Pathology Results   Thyroid cancer treated with thyroidectomy   Breast cancer of lower-outer quadrant of right female breast ([place])  07/18/2015 Breast MRI   Rt breast mass 0.9 x 0.8 x 0.8 cm, with clumped NME 4 cm, 2 sites of Bx proven flat epithelial atypia   07/25/2015 Initial Diagnosis   Rt Breast Biopsy: IDC with calcs and DCIS and ALH, Er 100%, PR 100%, Ki 67 5%, Her 2 Neg Ratio 1.35   07/25/2015 Clinical Stage   Stage IA: T1b N0   07/25/2015 Oncotype testing   RS 6 (5% ROR)   07/26/2015 Procedure   Genetic testing revealed BRIP-1   08/28/2015 Surgery   Bilateral mastectomies with reconstruction in [place] at [facility] for restorative surgery (Dr.Delacroce): Residual high-grade DCIS 1.5 cm, 0/2 sentinel nodes   09/24/2015 - 11/29/2015 Anti-estrogen oral therapy   Tamoxifen 20 mg daily. Stopped May 2017 for pulmonary embolism   10/31/2015 Survivorship   Care plan completed and mailed to patient in lieu of in person visit at her  request   11/20/2015 - 11/23/2015 Hospital Admission   Hospitalization for pulmonary embolism   05/15/2016 Surgery   Hysterectomy and BSO   07/23/2016 -  Anti-estrogen oral therapy   Letrozole 2.5 mg daily switched to anastrozole 03/23/2017 due to hot flashes and myalgias   03/17/2017 PET scan   No evidence of hypermetabolic recurrent or metastatic disease. Decrease in left hepatic lobe hypermetabolism, corresponding to a benign abnormality on prior MRI ; likely focal fat; Resolved left-sided pleural effusion     History of Present Illness: The patient's records from the referring physician were obtained and reviewed and the patient interviewed to confirm this HPI.  Stacy Chung presents to clinic to discuss recent short term memory loss.  She describes losing ability to track detailed information, including peoples names, calculations, directions.  This complaint has been noted since 2017 when she began treatment for breast cancer.  There does not seem to have been any further decline recently.  She maintains full functional status otherwise, with some activity limitations from congestive heart failure (diagnosed last year).  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  She is active in her family life, is married with 2 children.  Has MRI brain scheduled for next month, as well as neuropsych testing in June.     Medications: Current Outpatient Medications on File Prior to Visit  Medication Sig Dispense Refill  . AMBULATORY NON FORMULARY MEDICATION Myofascial release therapy and massage therapy 2-3 times a week as needed 1 each 0  . anastrozole (  ARIMIDEX) 1 MG tablet TAKE 1 TABLET(1 MG) BY MOUTH DAILY 90 tablet 3  . apixaban (ELIQUIS) 5 MG TABS tablet Take 1 tablet (5 mg total) by mouth 2 (two) times daily. 60 tablet 11  . buPROPion HCl ER, XL, 450 MG TB24 Take 450 mg by mouth daily. 90 tablet 3  . CALCIUM PO Take 1,200 mg by mouth daily.     .  Cholecalciferol (VITAMIN D3) 10000 UNITS capsule Take 10,000 Units by mouth daily. Only 5 days a week     . clonazePAM (KLONOPIN) 0.5 MG tablet Take 1 tablet (0.5 mg total) by mouth 2 (two) times daily. For anxiety 60 tablet 0  . digoxin (LANOXIN) 0.125 MG tablet Take 1 tablet (0.125 mg total) by mouth daily. 30 tablet 3  . esomeprazole (NEXIUM) 40 MG capsule Take 1 capsule (40 mg total) by mouth daily at 12 noon. 90 capsule 3  . levothyroxine (SYNTHROID, LEVOTHROID) 175 MCG tablet Take 1 tablet (175 mcg total) by mouth daily. 90 tablet 3  . metoprolol succinate (TOPROL-XL) 25 MG 24 hr tablet Take 1.5 tablets (37.5 mg total) by mouth daily. 45 tablet 11  . ondansetron (ZOFRAN-ODT) 8 MG disintegrating tablet Take 1 tablet (8 mg total) by mouth every 8 (eight) hours as needed for nausea. 60 tablet 3  . Prasterone (INTRAROSA) 6.5 MG INST Place 1 application vaginally daily. 30 each 2  . sacubitril-valsartan (ENTRESTO) 24-26 MG Take 0.5 tablets by mouth 2 (two) times daily. 60 tablet   . spironolactone (ALDACTONE) 25 MG tablet Take 0.5 tablets (12.5 mg total) by mouth daily.    Marland Kitchen vortioxetine HBr (TRINTELLIX) 5 MG TABS tablet Take 1 tablet (5 mg total) by mouth daily. 30 tablet 3  . zolmitriptan (ZOMIG) 5 MG tablet Take 1 tablet (5 mg total) by mouth as needed. 30 tablet 3   Current Facility-Administered Medications on File Prior to Visit  Medication Dose Route Frequency Provider Last Rate Last Admin  . technetium medronate (TC-MDP) injection 20 millicurie  20 millicurie Intravenous Once PRN Felipa Emory, MD        Allergies:  Allergies  Allergen Reactions  . Other Rash    "bandaides"  . Macrodantin   . Pertussis Vaccines   . Tape Rash    "plastic tape"   Past Medical History:  Past Medical History:  Diagnosis Date  . Allergy   . Anxiety   . Breast cancer ([place])   . Cancer [facility]) 2003   papillary thyroid cancer  . Cancer ([place]) 1986   Hodgkins lymphoma  . Carcinoma of thyroid ([place])    . Family history of breast cancer   . Family history of colon cancer   . Family history of prostate cancer   . GERD (gastroesophageal reflux disease)   . History of chicken pox   . Hodgkin's disease   . Hx: UTI (urinary tract infection)   . Increased frequency of headaches   . Left breast mass   . Migraines   . PONV (postoperative nausea and vomiting)   . Primary spontaneous pneumothorax 1990   left  . Pulmonary embolism ([place]) 11/30/2015  . Recurrent spontaneous pneumothorax 1991   left  . Skin cancer 2003   Melanoma  . Thyroid disease    Past Surgical History:  Past Surgical History:  Procedure Laterality Date  . ABDOMINAL HYSTERECTOMY    . CHEST TUBE INSERTION  1990   collasped lung, spontaneous  . CHOLECYSTECTOMY    . FOOT TENDON  SURGERY Left   . LAPAROSCOPIC SPLENECTOMY  1987   Hodgkins Disease  . MASTECTOMY     BIL  . THORACOTOMY Left 1991   recurrent spontaneous pneumothorax - Dr Truman Hayward  . THYROIDECTOMY  2004   nodules, carcinoma  . URETEROPLASTY  1977   malformed ureter   Social History:  Social History   Socioeconomic History  . Marital status: Married    Spouse name: Not on file  . Number of children: 2  . Years of education: 50  . Highest education level: Not on file  Occupational History  . Occupation: Chief Strategy Officer  Tobacco Use  . Smoking status: Never Smoker  . Smokeless tobacco: Never Used  Substance and Sexual Activity  . Alcohol use: Not Currently  . Drug use: No  . Sexual activity: Yes    Birth control/protection: None  Other Topics Concern  . Not on file  Social History Narrative   Regular exercise-no   Social Determinants of Health   Financial Resource Strain:   . Difficulty of Paying Living Expenses:   Food Insecurity:   . Worried About Charity fundraiser in the Last Year:   . Arboriculturist in the Last Year:   Transportation Needs:   . Film/video editor (Medical):   Marland Kitchen Lack of Transportation (Non-Medical):    Physical Activity:   . Days of Exercise per Week:   . Minutes of Exercise per Session:   Stress:   . Feeling of Stress :   Social Connections:   . Frequency of Communication with Friends and Family:   . Frequency of Social Gatherings with Friends and Family:   . Attends Religious Services:   . Active Member of Clubs or Organizations:   . Attends Archivist Meetings:   Marland Kitchen Marital Status:   Intimate Partner Violence:   . Fear of Current or Ex-Partner:   . Emotionally Abused:   Marland Kitchen Physically Abused:   . Sexually Abused:    Family History:  Family History  Problem Relation Age of Onset  . Prostate cancer Maternal Uncle 79  . Colon cancer Maternal Grandmother        dx in her 76s  . Prostate cancer Maternal Grandfather        dx in his 64s  . Cancer Other        FH of Breast Cancer, Ovarian/Uterine Cancer  . Heart disease Maternal Uncle        8s  . Breast cancer Other        MGFs sister - reportedly BRCA+  . Breast cancer Other        MGMs sister  . Breast cancer Other        MGFs mother    Review of Systems: Constitutional: Doesn't report fevers, chills or abnormal weight loss Eyes: Doesn't report blurriness of vision Ears, nose, mouth, throat, and face: Doesn't report sore throat Respiratory: Doesn't report cough, dyspnea or wheezes Cardiovascular: Doesn't report palpitation, chest discomfort  Gastrointestinal:  Doesn't report nausea, constipation, diarrhea GU: Doesn't report incontinence Skin: Doesn't report skin rashes Neurological: Per HPI Musculoskeletal: Doesn't report joint pain Behavioral/Psych: Doesn't report anxiety  Physical Exam: Vitals:   10/07/19 1045  BP: 111/67  Pulse: 82  Resp: 17  Temp: 98.5 F (36.9 C)  SpO2: 98%   KPS: 90. General: Alert, cooperative, pleasant, in no acute distress Head: Normal EENT: No conjunctival injection or scleral icterus.  Lungs: Resp effort normal Cardiac: Regular rate Abdomen:  Non-distended  abdomen Skin: No rashes cyanosis or petechiae. Extremities: No clubbing or edema  Neurologic Exam: Mental Status: Awake, alert, attentive to examiner. Oriented to self and environment. Language is fluent with intact comprehension.  Cranial Nerves: Visual acuity is grossly normal. Visual fields are full. Extra-ocular movements intact. No ptosis. Face is symmetric Motor: Tone and bulk are normal. Power is full in both arms and legs. Reflexes are symmetric, no pathologic reflexes present.  Sensory: Intact to light touch Gait: Normal.   Labs: I have reviewed the data as listed    Component Value Date/Time   NA 136 03/22/2019 1500   NA 141 03/17/2017 1225   K 4.7 03/22/2019 1500   K 3.6 03/17/2017 1225   CL 104 03/22/2019 1500   CO2 20 (L) 03/22/2019 1500   CO2 25 03/17/2017 1225   GLUCOSE 98 03/22/2019 1500   GLUCOSE 91 03/17/2017 1225   BUN 28 (H) 03/22/2019 1500   BUN 15.9 03/17/2017 1225   CREATININE 1.10 (H) 03/22/2019 1500   CREATININE 1.11 (H) 03/18/2019 1538   CREATININE 0.8 03/17/2017 1225   CALCIUM 9.7 03/22/2019 1500   CALCIUM 9.7 03/17/2017 1225   PROT 7.7 03/22/2019 1500   PROT 7.6 03/17/2017 1225   ALBUMIN 4.1 03/22/2019 1500   ALBUMIN 3.8 03/17/2017 1225   AST 25 03/22/2019 1500   AST 20 03/17/2017 1225   ALT 44 03/22/2019 1500   ALT 23 03/17/2017 1225   ALKPHOS 95 03/22/2019 1500   ALKPHOS 92 03/17/2017 1225   BILITOT 0.6 03/22/2019 1500   BILITOT <0.22 03/17/2017 1225   GFRNONAA >60 03/22/2019 1500   GFRNONAA 60 03/18/2019 1538   GFRAA >60 03/22/2019 1500   GFRAA 69 03/18/2019 1538   Lab Results  Component Value Date   WBC 8.0 03/18/2019   NEUTROABS 7.7 10/06/2018   HGB 14.3 03/18/2019   HCT 43.7 03/18/2019   MCV 93.4 03/18/2019   PLT 468 (H) 03/18/2019     Assessment/Plan Memory loss [R41.3]  Stacy Chung presents with 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  MRI from 2019 was reviewed and did  not demonstrate structural abnormality or leukomalacia.  Serum dementia panel was performed through [facility] provider and was normal.  She does have follow up MRI brain scheduled as well as neuropsych testing.  We suspect her impairments fall short of 'mild cognitive impairment'; recent [place] examination recorded in EMR was scored at 28/30.   Likely etiology of subjective complaints is multifactorial: childhood exposure to high dose chemotherapy regimen, breast cancer and its treatment, as well as significant cardiac comorbidity.  Overall lack of clinical progression/decline since 2018 is supportive of systemic etiology rather than organic dementia such as early alzheimers.  We recommended gentle aerobic activity, cognitive training (puzzles, games), and maintaining overall positive and hopeful attitude.   We spent twenty additional minutes teaching regarding the natural history, biology, and historical experience in the treatment of neurologic complications of cancer.   We appreciate the opportunity to participate in the care of Stacy Chung.  No further workup recommended at this time. She may follow up after neuropsych testing if desired, or sooner if needed.   All questions were answered. The patient knows to call the clinic with any problems, questions or concerns. No barriers to learning were detected.  The total time spent in the encounter was 40 minutes and more than 50% was on counseling and review of test results   Ventura Sellers,  MD Medical Director of Neuro-Oncology [facility] at [place] 10/07/19 4:14 PM

## 2019-10-07 NOTE — Telephone Encounter (Signed)
RN reviewed patient's mychart message with MD.  Pt reports previous heart cath a few months ago with no abnormalities.    CT scan completed on 3/25 with findings of Aortic arch atherosclerosis. Pt with AICD.  History of heart failure, induced by chemotherapy.  Pt inquiring to ensure reading is in fact new, vs related to AICD.  Per MD recommendations, verify with radiologist on findings of aortic arch atherosclerosis.  If this is a new finding, will will set patient up for a cardiologist consult.    RN contacted [facility], they will notify radiologist to do a review/add addendum report.    Pt is aware, and updated.  Voiced understanding, will await to here back on addendum.

## 2019-10-10 DIAGNOSIS — I428 Other cardiomyopathies: Secondary | ICD-10-CM | POA: Diagnosis not present

## 2019-10-10 DIAGNOSIS — Z45010 Encounter for checking and testing of cardiac pacemaker pulse generator [battery]: Secondary | ICD-10-CM | POA: Diagnosis not present

## 2019-10-10 DIAGNOSIS — Z7901 Long term (current) use of anticoagulants: Secondary | ICD-10-CM | POA: Diagnosis not present

## 2019-10-10 DIAGNOSIS — I447 Left bundle-branch block, unspecified: Secondary | ICD-10-CM | POA: Diagnosis not present

## 2019-10-10 DIAGNOSIS — Z79899 Other long term (current) drug therapy: Secondary | ICD-10-CM | POA: Diagnosis not present

## 2019-10-10 DIAGNOSIS — I5022 Chronic systolic (congestive) heart failure: Secondary | ICD-10-CM | POA: Diagnosis not present

## 2019-10-10 DIAGNOSIS — Z950 Presence of cardiac pacemaker: Secondary | ICD-10-CM | POA: Diagnosis not present

## 2019-10-10 DIAGNOSIS — Z95810 Presence of automatic (implantable) cardiac defibrillator: Secondary | ICD-10-CM | POA: Diagnosis not present

## 2019-10-11 ENCOUNTER — Encounter: Payer: Self-pay | Admitting: Internal Medicine

## 2019-10-11 ENCOUNTER — Telehealth: Payer: Self-pay | Admitting: *Deleted

## 2019-10-11 ENCOUNTER — Encounter: Payer: Self-pay | Admitting: *Deleted

## 2019-10-11 NOTE — Progress Notes (Signed)
successfully faxed CT chest to pt cardiology Dr. Kirtland Bouchard with [facility] [phone]) for MD to review and offer pt recommendation on aortic arch atherosclerosis.

## 2019-10-11 NOTE — Progress Notes (Signed)
RN attempt to contact pt cardiologist Dr. Kirtland Bouchard with [facility] [phone]).  No answer, LVM for nurse to return call regarding finding on pt recent chest CT.

## 2019-10-12 DIAGNOSIS — Z950 Presence of cardiac pacemaker: Secondary | ICD-10-CM | POA: Diagnosis not present

## 2019-10-12 DIAGNOSIS — I5022 Chronic systolic (congestive) heart failure: Secondary | ICD-10-CM | POA: Diagnosis not present

## 2019-10-13 DIAGNOSIS — Z95810 Presence of automatic (implantable) cardiac defibrillator: Secondary | ICD-10-CM | POA: Diagnosis not present

## 2019-10-13 DIAGNOSIS — Z4502 Encounter for adjustment and management of automatic implantable cardiac defibrillator: Secondary | ICD-10-CM | POA: Diagnosis not present

## 2019-10-13 DIAGNOSIS — I428 Other cardiomyopathies: Secondary | ICD-10-CM | POA: Diagnosis not present

## 2019-10-13 DIAGNOSIS — I5022 Chronic systolic (congestive) heart failure: Secondary | ICD-10-CM | POA: Diagnosis not present

## 2019-10-14 DIAGNOSIS — R413 Other amnesia: Secondary | ICD-10-CM | POA: Diagnosis not present

## 2019-10-14 DIAGNOSIS — R4189 Other symptoms and signs involving cognitive functions and awareness: Secondary | ICD-10-CM | POA: Diagnosis not present

## 2019-10-14 DIAGNOSIS — Z853 Personal history of malignant neoplasm of breast: Secondary | ICD-10-CM | POA: Diagnosis not present

## 2019-10-17 DIAGNOSIS — I5022 Chronic systolic (congestive) heart failure: Secondary | ICD-10-CM | POA: Diagnosis not present

## 2019-10-19 DIAGNOSIS — I5022 Chronic systolic (congestive) heart failure: Secondary | ICD-10-CM | POA: Diagnosis not present

## 2019-10-20 DIAGNOSIS — I5022 Chronic systolic (congestive) heart failure: Secondary | ICD-10-CM | POA: Diagnosis not present

## 2019-10-31 DIAGNOSIS — I428 Other cardiomyopathies: Secondary | ICD-10-CM | POA: Diagnosis not present

## 2019-10-31 DIAGNOSIS — I5022 Chronic systolic (congestive) heart failure: Secondary | ICD-10-CM | POA: Diagnosis not present

## 2019-10-31 DIAGNOSIS — Z853 Personal history of malignant neoplasm of breast: Secondary | ICD-10-CM | POA: Diagnosis not present

## 2019-10-31 DIAGNOSIS — I513 Intracardiac thrombosis, not elsewhere classified: Secondary | ICD-10-CM | POA: Diagnosis not present

## 2019-11-02 DIAGNOSIS — I513 Intracardiac thrombosis, not elsewhere classified: Secondary | ICD-10-CM | POA: Diagnosis not present

## 2019-11-02 DIAGNOSIS — R413 Other amnesia: Secondary | ICD-10-CM | POA: Diagnosis not present

## 2019-11-02 DIAGNOSIS — I428 Other cardiomyopathies: Secondary | ICD-10-CM | POA: Diagnosis not present

## 2019-11-02 DIAGNOSIS — I639 Cerebral infarction, unspecified: Secondary | ICD-10-CM | POA: Diagnosis not present

## 2019-11-02 DIAGNOSIS — Z923 Personal history of irradiation: Secondary | ICD-10-CM | POA: Diagnosis not present

## 2019-11-02 DIAGNOSIS — I5022 Chronic systolic (congestive) heart failure: Secondary | ICD-10-CM | POA: Diagnosis not present

## 2019-11-02 DIAGNOSIS — R002 Palpitations: Secondary | ICD-10-CM | POA: Diagnosis not present

## 2019-11-02 DIAGNOSIS — Z8572 Personal history of non-Hodgkin lymphomas: Secondary | ICD-10-CM | POA: Diagnosis not present

## 2019-11-02 DIAGNOSIS — Z853 Personal history of malignant neoplasm of breast: Secondary | ICD-10-CM | POA: Diagnosis not present

## 2019-11-07 ENCOUNTER — Telehealth: Payer: BC Managed Care – PPO | Admitting: Sports Medicine

## 2019-11-15 ENCOUNTER — Encounter: Payer: Self-pay | Admitting: Sports Medicine

## 2019-11-15 ENCOUNTER — Ambulatory Visit (INDEPENDENT_AMBULATORY_CARE_PROVIDER_SITE_OTHER): Payer: BC Managed Care – PPO | Admitting: Sports Medicine

## 2019-11-15 ENCOUNTER — Telehealth: Payer: Self-pay | Admitting: Sports Medicine

## 2019-11-15 ENCOUNTER — Other Ambulatory Visit: Payer: Self-pay

## 2019-11-15 DIAGNOSIS — E039 Hypothyroidism, unspecified: Secondary | ICD-10-CM

## 2019-11-15 DIAGNOSIS — F32A Depression, unspecified: Secondary | ICD-10-CM

## 2019-11-15 DIAGNOSIS — F419 Anxiety disorder, unspecified: Secondary | ICD-10-CM

## 2019-11-15 DIAGNOSIS — I5021 Acute systolic (congestive) heart failure: Secondary | ICD-10-CM

## 2019-11-15 DIAGNOSIS — F329 Major depressive disorder, single episode, unspecified: Secondary | ICD-10-CM

## 2019-11-15 MED ORDER — ESOMEPRAZOLE MAGNESIUM 40 MG PO CPDR: 40.0000 mg | DELAYED_RELEASE_CAPSULE | Freq: Every day | ORAL | 3 refills | Status: DC

## 2019-11-15 MED ORDER — LEVOTHYROXINE SODIUM 175 MCG PO TABS: 175.0000 ug | ORAL_TABLET | Freq: Every day | ORAL | 3 refills | Status: DC

## 2019-11-15 MED ORDER — LISINOPRIL 2.5 MG PO TABS: 2.5000 mg | ORAL_TABLET | Freq: Every day | ORAL | 3 refills | Status: DC

## 2019-11-15 NOTE — Telephone Encounter (Signed)
Received fax for PA on Trintellix sent through cover my meds and medication is not covered by the patient's plan. Placing in providers box for review. - CF

## 2019-11-15 NOTE — Progress Notes (Signed)
    Procedures performed today:    None.  Independent interpretation of notes and tests performed by another provider:   None.  Brief History, Exam, Impression, and Recommendations:    Anxiety and depression Stacy Chung returns, she has still not been able to obtain Trintellix, it looks like there is still another prior authorization needed. She has tried other medications including citalopram and Zoloft with intolerances.  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 There was some hypotension on Entresto, this was discontinued and cardiology started losartan, also resulting in a bit of hypotension. Discontinue this, switching to low-dose lisinopril 2.5 mg, we will keep an eye on her blood pressures. We discussed the Stacy Chung law and the necessity for metoprolol.    ___________________________________________ Gwen Her. Dianah Field, M.D., ABFM., CAQSM. Primary Care and [place] Instructor of [place] of [facility] of Medicine

## 2019-11-15 NOTE — Assessment & Plan Note (Signed)
Stacy Chung returns, she has still not been able to obtain Trintellix, it looks like there is still another prior authorization needed. She has tried other medications including citalopram and Zoloft with intolerances.

## 2019-11-15 NOTE — Assessment & Plan Note (Addendum)
Chevette returns, she had an ICD implanted, ejection fraction is up to 40%. There was some hypotension on Entresto, this was discontinued and cardiology started losartan, also resulting in a bit of hypotension. Discontinue this, switching to low-dose lisinopril 2.5 mg, we will keep an eye on her blood pressures. We discussed the [name] law and the necessity for metoprolol.

## 2019-11-22 ENCOUNTER — Telehealth: Payer: Self-pay | Admitting: *Deleted

## 2019-11-22 NOTE — Telephone Encounter (Signed)
Pt scheduled for tomorrow at 1:30

## 2019-11-22 NOTE — Telephone Encounter (Signed)
Pt left vm wanting you to order a troponin for [facility].  She said that she had some "weird symptoms" last night and her husband thought that they sounded like "a lady heart attack".  She would like to go the the [facility] right by her house.

## 2019-11-22 NOTE — Telephone Encounter (Signed)
If there is concern for a myocardial infarction she really needs to be seen.

## 2019-11-23 ENCOUNTER — Encounter: Payer: Self-pay | Admitting: Sports Medicine

## 2019-11-23 ENCOUNTER — Ambulatory Visit (INDEPENDENT_AMBULATORY_CARE_PROVIDER_SITE_OTHER): Payer: BC Managed Care – PPO | Admitting: Sports Medicine

## 2019-11-23 ENCOUNTER — Other Ambulatory Visit: Payer: Self-pay

## 2019-11-23 DIAGNOSIS — E039 Hypothyroidism, unspecified: Secondary | ICD-10-CM | POA: Diagnosis not present

## 2019-11-23 DIAGNOSIS — I5021 Acute systolic (congestive) heart failure: Secondary | ICD-10-CM

## 2019-11-23 NOTE — Assessment & Plan Note (Addendum)
Kaydence returns, she has a history of postchemotherapy systolic heart failure, ejection fraction was up to 40%. She did have a murmur today but of note she did have severe mitral and tricuspid regurgitation on her echocardiogram. She was stopped on Entresto, losartan was started.  This was then switched to lisinopril 2.5. Over the past day she has had episodes of feeling weak, dizzy, having headaches, no overt chest pains, no palpitations. She has been checking her blood pressures and some have ranged in the [id number] systolic. She continues to have some episodes of hypotension so I am going to discontinue her lisinopril for now. We need to leave the metoprolol in place. Hydrate aggressively. Adding some labs including cardiac enzymes, blood cultures. She is currently euvolemic on exam, no lower extremity edema, no orthopnea, PND. Weight has been stable. She does have an appointment coming up with her cardiologist, hopefully they can interrogate her pacer, we did obtain and interpret an ECG today shows a paced ventricular rhythm at a rate of approximately 75.

## 2019-11-23 NOTE — Progress Notes (Addendum)
    Procedures performed today:    We did obtain and interpret an ECG today shows a paced ventricular rhythm at a rate of approximately 75.  Independent interpretation of notes and tests performed by another provider:   None.  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 She did have a murmur today but of note she did have severe mitral and tricuspid regurgitation on her echocardiogram. She was stopped on Entresto, losartan was started.  This was then switched to lisinopril 2.5. Over the past day she has had episodes of feeling weak, dizzy, having headaches, no overt chest pains, no palpitations. She has been checking her blood pressures and some have ranged in the [id number] systolic. She continues to have some episodes of hypotension so I am going to discontinue her lisinopril for now. We need to leave the metoprolol in place. Hydrate aggressively. Adding some labs including cardiac enzymes, blood cultures. She is currently euvolemic on exam, no lower extremity edema, no orthopnea, PND. Weight has been stable. She does have an appointment coming up with her cardiologist, hopefully they can interrogate her pacer, we did obtain and interpret an ECG today shows a paced ventricular rhythm at a rate of approximately 75.  Hypothyroidism TSH is very low indicating overtreatment with levothyroxine, I am going to go ahead and decrease her levothyroxine dose, recheck TSH in 6 weeks.    ___________________________________________ Gwen Her. Dianah Field, M.D., ABFM., CAQSM. Primary Care and [place] Instructor of [place] of [facility] of Medicine

## 2019-11-23 NOTE — Addendum Note (Signed)
Addended by: [name] on: 11/23/2019 02:01 PM   Modules accepted: Orders

## 2019-11-23 NOTE — Addendum Note (Signed)
Addended by: Jamesetta So on: 11/23/2019 03:24 PM   Modules accepted: Orders

## 2019-11-24 LAB — CBC
Hematocrit: 38.6 % (ref 34.0–46.6)
Hemoglobin: 12.8 g/dL (ref 11.1–15.9)
MCH: 31 pg (ref 26.6–33.0)
MCHC: 33.2 g/dL (ref 31.5–35.7)
MCV: 94 fL (ref 79–97)
Platelets: 368 10*3/uL (ref 150–450)
RBC: 4.13 x10E6/uL (ref 3.77–5.28)
RDW: 13.6 % (ref 11.7–15.4)
WBC: 6.8 10*3/uL (ref 3.4–10.8)

## 2019-11-24 LAB — COMPREHENSIVE METABOLIC PANEL
ALT: 35 IU/L — ABNORMAL HIGH (ref 0–32)
AST: 28 IU/L (ref 0–40)
Albumin/Globulin Ratio: 1.7 (ref 1.2–2.2)
Albumin: 4.5 g/dL (ref 3.8–4.8)
Alkaline Phosphatase: 122 IU/L — ABNORMAL HIGH (ref 39–117)
BUN/Creatinine Ratio: 17 (ref 9–23)
BUN: 16 mg/dL (ref 6–24)
Bilirubin Total: <0.2 mg/dL (ref 0.0–1.2)
CO2: 21 mmol/L (ref 20–29)
Calcium: 9.6 mg/dL (ref 8.7–10.2)
Chloride: 102 mmol/L (ref 96–106)
Creatinine, Ser: 0.92 mg/dL (ref 0.57–1.00)
GFR calc Af Amer: 86 mL/min/{1.73_m2} (ref >59)
GFR calc non Af Amer: 74 mL/min/{1.73_m2} (ref >59)
Globulin, Total: 2.7 g/dL (ref 1.5–4.5)
Glucose: 114 mg/dL — ABNORMAL HIGH (ref 65–99)
Potassium: 4.4 mmol/L (ref 3.5–5.2)
Sodium: 136 mmol/L (ref 134–144)
Total Protein: 7.2 g/dL (ref 6.0–8.5)

## 2019-11-24 LAB — TSH: TSH: 0.03 u[IU]/mL — ABNORMAL LOW (ref 0.450–4.500)

## 2019-11-24 LAB — CK TOTAL AND CKMB (NOT AT ARMC)
CK-MB Index: <1 ng/mL (ref 0.0–5.3)
Total CK: 60 U/L (ref 32–182)

## 2019-11-24 LAB — TROPONIN I: Troponin I: <0.01 ng/mL (ref 0.00–0.04)

## 2019-11-24 MED ORDER — LEVOTHYROXINE SODIUM 150 MCG PO TABS: 150.0000 ug | ORAL_TABLET | Freq: Every day | ORAL | 3 refills | Status: DC

## 2019-11-24 NOTE — Addendum Note (Signed)
Addended by: [name] on: 11/24/2019 08:37 AM   Modules accepted: Orders

## 2019-11-24 NOTE — Assessment & Plan Note (Signed)
TSH is very low indicating overtreatment with levothyroxine, I am going to go ahead and decrease her levothyroxine dose, recheck TSH in 6 weeks.

## 2019-11-29 DIAGNOSIS — Z85820 Personal history of malignant melanoma of skin: Secondary | ICD-10-CM | POA: Diagnosis not present

## 2019-11-29 DIAGNOSIS — Z853 Personal history of malignant neoplasm of breast: Secondary | ICD-10-CM | POA: Diagnosis not present

## 2019-11-29 DIAGNOSIS — Z7901 Long term (current) use of anticoagulants: Secondary | ICD-10-CM | POA: Diagnosis not present

## 2019-11-29 DIAGNOSIS — Z85850 Personal history of malignant neoplasm of thyroid: Secondary | ICD-10-CM | POA: Diagnosis not present

## 2019-11-29 DIAGNOSIS — Z86711 Personal history of pulmonary embolism: Secondary | ICD-10-CM | POA: Diagnosis not present

## 2019-11-29 DIAGNOSIS — Z8571 Personal history of Hodgkin lymphoma: Secondary | ICD-10-CM | POA: Diagnosis not present

## 2019-11-29 DIAGNOSIS — I700 Atherosclerosis of aorta: Secondary | ICD-10-CM | POA: Diagnosis not present

## 2019-11-29 DIAGNOSIS — Z86006 Personal history of melanoma in-situ: Secondary | ICD-10-CM | POA: Diagnosis not present

## 2019-11-29 DIAGNOSIS — I447 Left bundle-branch block, unspecified: Secondary | ICD-10-CM | POA: Diagnosis not present

## 2019-11-29 DIAGNOSIS — I428 Other cardiomyopathies: Secondary | ICD-10-CM | POA: Diagnosis not present

## 2019-11-29 DIAGNOSIS — Z79890 Hormone replacement therapy (postmenopausal): Secondary | ICD-10-CM | POA: Diagnosis not present

## 2019-11-29 DIAGNOSIS — I5022 Chronic systolic (congestive) heart failure: Secondary | ICD-10-CM | POA: Diagnosis not present

## 2019-11-29 DIAGNOSIS — E890 Postprocedural hypothyroidism: Secondary | ICD-10-CM | POA: Diagnosis not present

## 2019-11-29 DIAGNOSIS — Z95810 Presence of automatic (implantable) cardiac defibrillator: Secondary | ICD-10-CM | POA: Diagnosis not present

## 2019-11-29 DIAGNOSIS — Z923 Personal history of irradiation: Secondary | ICD-10-CM | POA: Diagnosis not present

## 2019-11-29 DIAGNOSIS — R002 Palpitations: Secondary | ICD-10-CM | POA: Diagnosis not present

## 2019-11-29 DIAGNOSIS — I513 Intracardiac thrombosis, not elsewhere classified: Secondary | ICD-10-CM | POA: Diagnosis not present

## 2019-11-29 DIAGNOSIS — I959 Hypotension, unspecified: Secondary | ICD-10-CM | POA: Diagnosis not present

## 2019-11-29 LAB — CULTURE, BLOOD (SINGLE)

## 2019-12-07 ENCOUNTER — Encounter: Payer: Self-pay | Admitting: Sports Medicine

## 2019-12-07 ENCOUNTER — Ambulatory Visit (INDEPENDENT_AMBULATORY_CARE_PROVIDER_SITE_OTHER): Payer: BC Managed Care – PPO | Admitting: Sports Medicine

## 2019-12-07 DIAGNOSIS — C50511 Malignant neoplasm of lower-outer quadrant of right female breast: Secondary | ICD-10-CM | POA: Diagnosis not present

## 2019-12-07 DIAGNOSIS — Z170 Estrogen receptor positive status [ER+]: Secondary | ICD-10-CM

## 2019-12-07 DIAGNOSIS — F329 Major depressive disorder, single episode, unspecified: Secondary | ICD-10-CM

## 2019-12-07 DIAGNOSIS — F419 Anxiety disorder, unspecified: Secondary | ICD-10-CM | POA: Diagnosis not present

## 2019-12-07 DIAGNOSIS — I5021 Acute systolic (congestive) heart failure: Secondary | ICD-10-CM

## 2019-12-07 DIAGNOSIS — F32A Depression, unspecified: Secondary | ICD-10-CM

## 2019-12-07 MED ORDER — ESCITALOPRAM OXALATE 5 MG PO TABS: 5.0000 mg | ORAL_TABLET | Freq: Every day | ORAL | 3 refills | Status: DC

## 2019-12-07 MED ORDER — ANASTROZOLE 1 MG PO TABS: ORAL_TABLET | ORAL | 3 refills | Status: DC

## 2019-12-07 NOTE — Progress Notes (Signed)
    Procedures performed today:    None.  Independent interpretation of notes and tests performed by another provider:   None.  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 We continue with beta-blocker and spironolactone, ejection fraction recovered to about 40%, overall after discontinuation of her ACE inhibitor she feels significantly better. Only a few episodes of low blood pressure after a couple of weeks. In September we ordered some labs including a BNP and a troponin coated under acute systolic heart failure, for some reason these did not get covered, I will send a note to our office manager to look into this.  Anxiety and depression Shakeyla also has some anxiety and depression, at this point Trintellix looks like a plan exclusion, she has tried citalopram and Zoloft with intolerance, switching to Lexapro 5. Return in 1 month to reevaluate with a PHQ and GAD.    ___________________________________________ Gwen Her. Dianah Field, M.D., ABFM., CAQSM. Primary Care and [place] Instructor of [place] of [facility] of Medicine

## 2019-12-07 NOTE — Assessment & Plan Note (Signed)
This is a pleasant 49 year old female with acute systolic heart failure, at this point we have discontinued her ACE inhibitor's, Delene Loll has been discontinued, she had symptomatic hypotension. We continue with beta-blocker and spironolactone, ejection fraction recovered to about 40%, overall after discontinuation of her ACE inhibitor she feels significantly better. Only a few episodes of low blood pressure after a couple of weeks. In September we ordered some labs including a BNP and a troponin coated under acute systolic heart failure, for some reason these did not get covered, I will send a note to our office manager to look into this.

## 2019-12-07 NOTE — Assessment & Plan Note (Signed)
Raeana also has some anxiety and depression, at this point Trintellix looks like a plan exclusion, she has tried citalopram and Zoloft with intolerance, switching to Lexapro 5. Return in 1 month to reevaluate with a PHQ and GAD.

## 2019-12-16 DIAGNOSIS — T451X5A Adverse effect of antineoplastic and immunosuppressive drugs, initial encounter: Secondary | ICD-10-CM | POA: Diagnosis not present

## 2019-12-16 DIAGNOSIS — I427 Cardiomyopathy due to drug and external agent: Secondary | ICD-10-CM | POA: Diagnosis not present

## 2019-12-16 DIAGNOSIS — I5020 Unspecified systolic (congestive) heart failure: Secondary | ICD-10-CM | POA: Diagnosis not present

## 2019-12-19 DIAGNOSIS — G3184 Mild cognitive impairment, so stated: Secondary | ICD-10-CM | POA: Diagnosis not present

## 2019-12-19 DIAGNOSIS — F4323 Adjustment disorder with mixed anxiety and depressed mood: Secondary | ICD-10-CM | POA: Diagnosis not present

## 2019-12-20 ENCOUNTER — Telehealth (INDEPENDENT_AMBULATORY_CARE_PROVIDER_SITE_OTHER): Payer: BC Managed Care – PPO | Admitting: Sports Medicine

## 2019-12-20 DIAGNOSIS — F419 Anxiety disorder, unspecified: Secondary | ICD-10-CM

## 2019-12-20 DIAGNOSIS — F32A Depression, unspecified: Secondary | ICD-10-CM

## 2019-12-20 DIAGNOSIS — F329 Major depressive disorder, single episode, unspecified: Secondary | ICD-10-CM

## 2019-12-20 NOTE — Assessment & Plan Note (Signed)
This is a pleasant 49 year old female with a complex medical history, we have been treating her anxiety and depression, Trintellix was a plan exclusion and she had tried citalopram and Zoloft with intolerances. I had added Lexapro 5. At the same time she had visited her cardiologist, they did decrease her metoprolol. She noted some increasing PVCs, suspected that it was from the Lexapro and discontinued the medication. I explained her that SSRIs would likely prolong the QT interval, and that the increase in PVCs was likely related to the decrease in her metoprolol dose. She does have a biventricular pacer that would likely shock her should she develop any dangerous arrhythmias, and I have advised her that she should probably restart the Lexapro, should she develop PVCs again I had be happy to stop it. She can keep her follow-up with me on the 23rd of this month.

## 2019-12-20 NOTE — Progress Notes (Signed)
   Virtual Visit via Telephone   I connected with  Stacy Chung  on 12/20/19 by telephone/telehealth and verified that I am speaking with the correct person using two identifiers.   I discussed the limitations, risks, security and privacy concerns of performing an evaluation and management service by telephone, including the higher likelihood of inaccurate diagnosis and treatment, and the availability of in person appointments.  We also discussed the likely need of an additional face to face encounter for complete and high quality delivery of care.  I also discussed with the patient that there may be a patient responsible charge related to this service. The patient expressed understanding and wishes to proceed.  Provider location is either at home or medical facility. Patient location is at their home, different from provider location. People involved in care of the patient during this telehealth encounter were myself, my nurse/medical assistant, and my front office/scheduling team member.  Review of Systems: No fevers, chills, night sweats, weight loss, chest pain, or shortness of breath.   Objective Findings:    General: Speaking full sentences, no audible heavy breathing.  Sounds alert and appropriately interactive.    Independent interpretation of tests performed by another provider:   None.  Brief History, Exam, Impression, and Recommendations:    Anxiety and depression This is a pleasant 49 year old female with a complex medical history, we have been treating her anxiety and depression, Trintellix was a plan exclusion and she had tried citalopram and Zoloft with intolerances. I had added Lexapro 5. At the same time she had visited her cardiologist, they did decrease her metoprolol. She noted some increasing PVCs, suspected that it was from the Lexapro and discontinued the medication. I explained her that SSRIs would likely prolong the QT interval, and that the increase in PVCs  was likely related to the decrease in her metoprolol dose. She does have a biventricular pacer that would likely shock her should she develop any dangerous arrhythmias, and I have advised her that she should probably restart the Lexapro, should she develop PVCs again I had be happy to stop it. She can keep her follow-up with me on the 23rd of this month.   I discussed the above assessment and treatment plan with the patient. The patient was provided an opportunity to ask questions and all were answered. The patient agreed with the plan and demonstrated an understanding of the instructions.   The patient was advised to call back or seek an in-person evaluation if the symptoms worsen or if the condition fails to improve as anticipated.   I provided 30 minutes of face to face and non-face-to-face time during this encounter date, time was needed to gather information, review chart, records, communicate/coordinate with staff remotely, as well as complete documentation.   ___________________________________________ Gwen Her. Dianah Field, M.D., ABFM., CAQSM. Primary Care and Sports Medicine [facility]  Adjunct Professor of [place] of [facility] of [employer]

## 2019-12-21 DIAGNOSIS — I5022 Chronic systolic (congestive) heart failure: Secondary | ICD-10-CM | POA: Diagnosis not present

## 2019-12-30 DIAGNOSIS — G3184 Mild cognitive impairment, so stated: Secondary | ICD-10-CM | POA: Insufficient documentation

## 2019-12-30 DIAGNOSIS — F0670 Mild neurocognitive disorder due to known physiological condition without behavioral disturbance: Secondary | ICD-10-CM | POA: Insufficient documentation

## 2020-01-03 DIAGNOSIS — R4189 Other symptoms and signs involving cognitive functions and awareness: Secondary | ICD-10-CM | POA: Diagnosis not present

## 2020-01-04 ENCOUNTER — Encounter: Payer: Self-pay | Admitting: Sports Medicine

## 2020-01-04 ENCOUNTER — Ambulatory Visit (INDEPENDENT_AMBULATORY_CARE_PROVIDER_SITE_OTHER): Payer: BC Managed Care – PPO | Admitting: Sports Medicine

## 2020-01-04 DIAGNOSIS — F32A Depression, unspecified: Secondary | ICD-10-CM

## 2020-01-04 DIAGNOSIS — F329 Major depressive disorder, single episode, unspecified: Secondary | ICD-10-CM

## 2020-01-04 DIAGNOSIS — F419 Anxiety disorder, unspecified: Secondary | ICD-10-CM

## 2020-01-04 DIAGNOSIS — M75102 Unspecified rotator cuff tear or rupture of left shoulder, not specified as traumatic: Secondary | ICD-10-CM

## 2020-01-04 DIAGNOSIS — M75101 Unspecified rotator cuff tear or rupture of right shoulder, not specified as traumatic: Secondary | ICD-10-CM | POA: Diagnosis not present

## 2020-01-04 NOTE — Progress Notes (Signed)
    Procedures performed today:    Procedure: Real-time Ultrasound Guided injection of the right glenohumeral joint Device: Samsung HS60  Verbal informed consent obtained.  Time-out conducted.  Noted no overlying erythema, induration, or other signs of local infection.  Skin prepped in a sterile fashion.  Local anesthesia: Topical Ethyl chloride.  With sterile technique and under real time ultrasound guidance: 1 cc Kenalog 40, 2 cc lidocaine, 2 cc bupivacaine injected easily Completed without difficulty  Pain immediately resolved suggesting accurate placement of the medication.  Advised to call if fevers/chills, erythema, induration, drainage, or persistent bleeding.  Images permanently stored and available for review in the ultrasound unit.  Impression: Technically successful ultrasound guided injection.  Independent interpretation of notes and tests performed by another provider:   None.  Brief History, Exam, Impression, and Recommendations:    Anxiety and depression This is a pleasant 49 year old female with a complex medical history, we have tried several antidepressants in the past, there have been several intolerances and plan exclusions, we had initially added Lexapro 5, she thought she was getting PVCs so discontinued it, we did not restart this until approximately 2 weeks ago. She does not feel any efficacy yet as expected, follow-up with me in a month and a virtual visit to do a PHQ and GAD, we can always uptitrate the dose. Her PVCs have disappeared. Of note her metoprolol was decreased which was likely the reason for the increased sensation of PVCs.  Rotator cuff syndrome of both shoulders Recurrence of right shoulder pain last injected, glenohumeral joint in 09/26/2019, recurrence of pain, repeat glenohumeral injection today. Return as needed.    ___________________________________________ Gwen Her. Dianah Field, M.D., ABFM., CAQSM. Primary Care and [place] Instructor of [place] of [facility] of Medicine

## 2020-01-04 NOTE — Assessment & Plan Note (Signed)
This is a pleasant 49 year old female with a complex medical history, we have tried several antidepressants in the past, there have been several intolerances and plan exclusions, we had initially added Lexapro 5, she thought she was getting PVCs so discontinued it, we did not restart this until approximately 2 weeks ago. She does not feel any efficacy yet as expected, follow-up with me in a month and a virtual visit to do a PHQ and GAD, we can always uptitrate the dose. Her PVCs have disappeared. Of note her metoprolol was decreased which was likely the reason for the increased sensation of PVCs.

## 2020-01-04 NOTE — Assessment & Plan Note (Signed)
Recurrence of right shoulder pain last injected, glenohumeral joint in 09/26/2019, recurrence of pain, repeat glenohumeral injection today. Return as needed.

## 2020-01-09 DIAGNOSIS — Z4502 Encounter for adjustment and management of automatic implantable cardiac defibrillator: Secondary | ICD-10-CM | POA: Diagnosis not present

## 2020-01-09 DIAGNOSIS — I43 Cardiomyopathy in diseases classified elsewhere: Secondary | ICD-10-CM | POA: Diagnosis not present

## 2020-01-09 DIAGNOSIS — I255 Ischemic cardiomyopathy: Secondary | ICD-10-CM | POA: Diagnosis not present

## 2020-01-09 DIAGNOSIS — Z95810 Presence of automatic (implantable) cardiac defibrillator: Secondary | ICD-10-CM | POA: Diagnosis not present

## 2020-01-24 ENCOUNTER — Other Ambulatory Visit: Payer: Self-pay | Admitting: Sports Medicine

## 2020-01-24 DIAGNOSIS — E039 Hypothyroidism, unspecified: Secondary | ICD-10-CM

## 2020-01-24 MED ORDER — LEVOTHYROXINE SODIUM 150 MCG PO TABS: 150.0000 ug | ORAL_TABLET | Freq: Every day | ORAL | 3 refills | Status: DC

## 2020-01-27 DIAGNOSIS — J029 Acute pharyngitis, unspecified: Secondary | ICD-10-CM | POA: Diagnosis not present

## 2020-01-27 DIAGNOSIS — L309 Dermatitis, unspecified: Secondary | ICD-10-CM | POA: Diagnosis not present

## 2020-02-03 ENCOUNTER — Other Ambulatory Visit: Payer: Self-pay | Admitting: Sports Medicine

## 2020-02-03 ENCOUNTER — Telehealth: Payer: BC Managed Care – PPO | Admitting: Sports Medicine

## 2020-02-06 ENCOUNTER — Other Ambulatory Visit: Payer: Self-pay | Admitting: Sports Medicine

## 2020-02-06 DIAGNOSIS — E039 Hypothyroidism, unspecified: Secondary | ICD-10-CM

## 2020-02-06 MED ORDER — LEVOTHYROXINE SODIUM 150 MCG PO TABS: 150.0000 ug | ORAL_TABLET | Freq: Every day | ORAL | 3 refills | Status: DC

## 2020-03-24 ENCOUNTER — Other Ambulatory Visit (HOSPITAL_COMMUNITY): Payer: Self-pay | Admitting: Internal Medicine

## 2020-03-27 ENCOUNTER — Telehealth (HOSPITAL_COMMUNITY): Payer: Self-pay | Admitting: Pharmacy Technician

## 2020-03-27 NOTE — Telephone Encounter (Signed)
Advanced Heart Failure Patient Advocate Encounter  Prior Authorization for Eliquis has been approved.    PA# [id number] Effective dates: 02/26/20 through 03/27/21  Patients co-pay is $40.   Charlann Boxer, CPhT

## 2020-04-16 DIAGNOSIS — Z4509 Encounter for adjustment and management of other cardiac device: Secondary | ICD-10-CM | POA: Diagnosis not present

## 2020-04-16 DIAGNOSIS — Z4502 Encounter for adjustment and management of automatic implantable cardiac defibrillator: Secondary | ICD-10-CM | POA: Diagnosis not present

## 2020-04-16 DIAGNOSIS — I428 Other cardiomyopathies: Secondary | ICD-10-CM | POA: Diagnosis not present

## 2020-04-20 ENCOUNTER — Ambulatory Visit (INDEPENDENT_AMBULATORY_CARE_PROVIDER_SITE_OTHER): Payer: BC Managed Care – PPO

## 2020-04-20 ENCOUNTER — Other Ambulatory Visit: Payer: Self-pay

## 2020-04-20 ENCOUNTER — Ambulatory Visit (INDEPENDENT_AMBULATORY_CARE_PROVIDER_SITE_OTHER): Payer: BC Managed Care – PPO | Admitting: Sports Medicine

## 2020-04-20 DIAGNOSIS — M75101 Unspecified rotator cuff tear or rupture of right shoulder, not specified as traumatic: Secondary | ICD-10-CM

## 2020-04-20 DIAGNOSIS — F419 Anxiety disorder, unspecified: Secondary | ICD-10-CM

## 2020-04-20 DIAGNOSIS — N898 Other specified noninflammatory disorders of vagina: Secondary | ICD-10-CM

## 2020-04-20 DIAGNOSIS — I5021 Acute systolic (congestive) heart failure: Secondary | ICD-10-CM

## 2020-04-20 DIAGNOSIS — Z170 Estrogen receptor positive status [ER+]: Secondary | ICD-10-CM

## 2020-04-20 DIAGNOSIS — C50511 Malignant neoplasm of lower-outer quadrant of right female breast: Secondary | ICD-10-CM

## 2020-04-20 DIAGNOSIS — Z23 Encounter for immunization: Secondary | ICD-10-CM

## 2020-04-20 DIAGNOSIS — M75102 Unspecified rotator cuff tear or rupture of left shoulder, not specified as traumatic: Secondary | ICD-10-CM

## 2020-04-20 DIAGNOSIS — F32A Depression, unspecified: Secondary | ICD-10-CM

## 2020-04-20 DIAGNOSIS — E039 Hypothyroidism, unspecified: Secondary | ICD-10-CM

## 2020-04-20 MED ORDER — LEVOTHYROXINE SODIUM 150 MCG PO TABS: 150.0000 ug | ORAL_TABLET | Freq: Every day | ORAL | 3 refills | Status: DC

## 2020-04-20 MED ORDER — DIGOXIN 125 MCG PO TABS: 0.1250 mg | ORAL_TABLET | Freq: Every day | ORAL | 3 refills | Status: DC

## 2020-04-20 MED ORDER — BUPROPION HCL ER (XL) 450 MG PO TB24: 450.0000 mg | ORAL_TABLET | Freq: Every day | ORAL | 3 refills | Status: DC

## 2020-04-20 MED ORDER — ESCITALOPRAM OXALATE 5 MG PO TABS
5.0000 mg | ORAL_TABLET | Freq: Every day | ORAL | 3 refills | Status: DC
Start: 2020-04-20 — End: 2021-03-13

## 2020-04-20 MED ORDER — CLONAZEPAM 0.5 MG PO TABS
ORAL_TABLET | ORAL | 0 refills | Status: DC
Start: 2020-04-20 — End: 2021-03-05

## 2020-04-20 MED ORDER — DIGOXIN 125 MCG PO TABS
0.1250 mg | ORAL_TABLET | Freq: Every day | ORAL | 3 refills | Status: DC
Start: 2020-04-20 — End: 2022-01-09

## 2020-04-20 MED ORDER — VORTIOXETINE HBR 5 MG PO TABS: 5.0000 mg | ORAL_TABLET | Freq: Every day | ORAL | 3 refills | Status: DC

## 2020-04-20 MED ORDER — ZOLMITRIPTAN 5 MG PO TABS
5.0000 mg | ORAL_TABLET | ORAL | 3 refills | Status: DC | PRN
Start: 2020-04-20 — End: 2022-04-03

## 2020-04-20 MED ORDER — ESCITALOPRAM OXALATE 5 MG PO TABS: 5.0000 mg | ORAL_TABLET | Freq: Every day | ORAL | 3 refills | Status: DC

## 2020-04-20 MED ORDER — INTRAROSA 6.5 MG VA INST: 1.0000 "application " | VAGINAL_INSERT | Freq: Every day | VAGINAL | 3 refills | Status: DC

## 2020-04-20 MED ORDER — ANASTROZOLE 1 MG PO TABS: ORAL_TABLET | ORAL | 3 refills | Status: DC

## 2020-04-20 MED ORDER — ZOLMITRIPTAN 5 MG PO TABS: 5.0000 mg | ORAL_TABLET | ORAL | 3 refills | Status: DC | PRN

## 2020-04-20 MED ORDER — CLONAZEPAM 0.5 MG PO TABS: ORAL_TABLET | ORAL | 0 refills | Status: DC

## 2020-04-20 MED ORDER — ONDANSETRON 8 MG PO TBDP: 8.0000 mg | ORAL_TABLET | Freq: Three times a day (TID) | ORAL | 3 refills | Status: AC | PRN

## 2020-04-20 MED ORDER — INTRAROSA 6.5 MG VA INST: 1.0000 "application " | VAGINAL_INSERT | Freq: Every day | VAGINAL | 3 refills | Status: AC

## 2020-04-20 MED ORDER — ROSUVASTATIN CALCIUM 20 MG PO TABS
20.0000 mg | ORAL_TABLET | Freq: Every day | ORAL | 3 refills | Status: DC
Start: 2020-04-20 — End: 2022-01-09

## 2020-04-20 MED ORDER — METOPROLOL SUCCINATE ER 25 MG PO TB24: 37.5000 mg | ORAL_TABLET | Freq: Every day | ORAL | 11 refills | Status: DC

## 2020-04-20 MED ORDER — ONDANSETRON 8 MG PO TBDP: 8.0000 mg | ORAL_TABLET | Freq: Three times a day (TID) | ORAL | 3 refills | Status: DC | PRN

## 2020-04-20 MED ORDER — SPIRONOLACTONE 25 MG PO TABS: 25.0000 mg | ORAL_TABLET | Freq: Every day | ORAL | 3 refills | Status: DC

## 2020-04-20 MED ORDER — ESOMEPRAZOLE MAGNESIUM 40 MG PO CPDR: 40.0000 mg | DELAYED_RELEASE_CAPSULE | Freq: Every day | ORAL | 3 refills | Status: DC

## 2020-04-20 MED ORDER — ROSUVASTATIN CALCIUM 20 MG PO TABS: 20.0000 mg | ORAL_TABLET | Freq: Every day | ORAL | 3 refills | Status: DC

## 2020-04-20 MED ORDER — ESOMEPRAZOLE MAGNESIUM 40 MG PO CPDR
40.0000 mg | DELAYED_RELEASE_CAPSULE | Freq: Every day | ORAL | 3 refills | Status: DC
Start: 2020-04-20 — End: 2021-04-01

## 2020-04-20 MED ORDER — APIXABAN 5 MG PO TABS
5.0000 mg | ORAL_TABLET | Freq: Two times a day (BID) | ORAL | 2 refills | Status: DC
Start: 2020-04-20 — End: 2021-04-01

## 2020-04-20 MED ORDER — APIXABAN 5 MG PO TABS: 5.0000 mg | ORAL_TABLET | Freq: Two times a day (BID) | ORAL | 2 refills | Status: DC

## 2020-04-20 NOTE — Assessment & Plan Note (Signed)
Repeat right glenohumeral joint injection today, previously injected in June.

## 2020-04-20 NOTE — Progress Notes (Signed)
    Procedures performed today:    Procedure: Real-time Ultrasound Guided injection of the right glenohumeral joint Device: Samsung HS60  Verbal informed consent obtained.  Time-out conducted.  Noted no overlying erythema, induration, or other signs of local infection.  Skin prepped in a sterile fashion.  Local anesthesia: Topical Ethyl chloride.  With sterile technique and under real time ultrasound guidance: 1 cc Kenalog 40, 2 cc lidocaine, 2 cc bupivacaine injected easily Completed without difficulty  Pain immediately resolved suggesting accurate placement of the medication.  Advised to call if fevers/chills, erythema, induration, drainage, or persistent bleeding.  Images permanently stored and available for review in PACS.  Impression: Technically successful ultrasound guided injection.  Independent interpretation of notes and tests performed by another provider:   None.  Brief History, Exam, Impression, and Recommendations:    Rotator cuff syndrome of both shoulders Repeat right glenohumeral joint injection today, previously injected in June.    ___________________________________________ Gwen Her. Dianah Field, M.D., ABFM., CAQSM. Primary Care and [place] Instructor of [place] of [facility] of Medicine

## 2020-04-22 IMAGING — DX DG CHEST 2V
2 series · 2 of 2 positions shown · non-contrast
Comparison: Radiograph November 16, 2017.

CLINICAL DATA: Left pneumothorax.

EXAM:
CHEST - 2 VIEW

[chest lat]
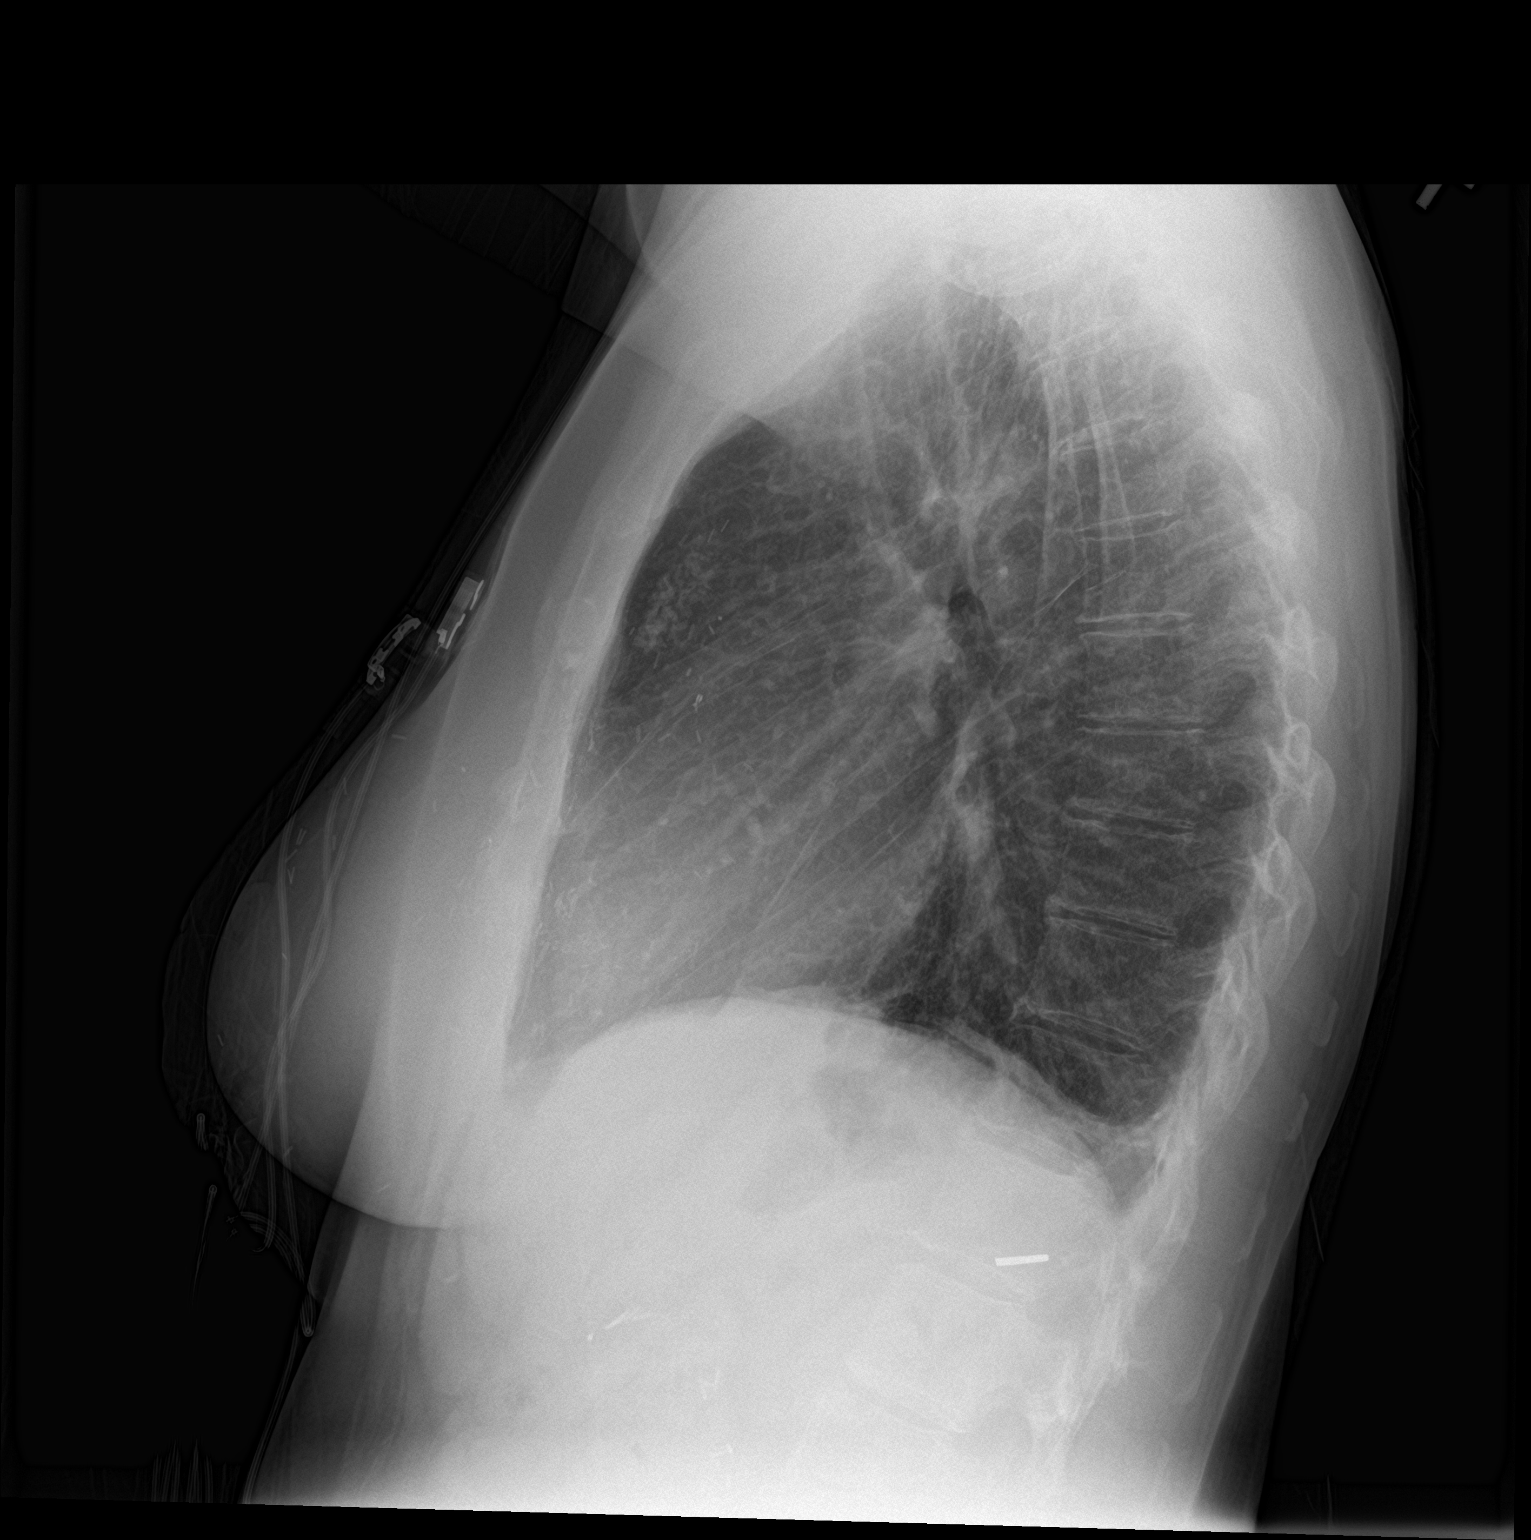

[chest pa]
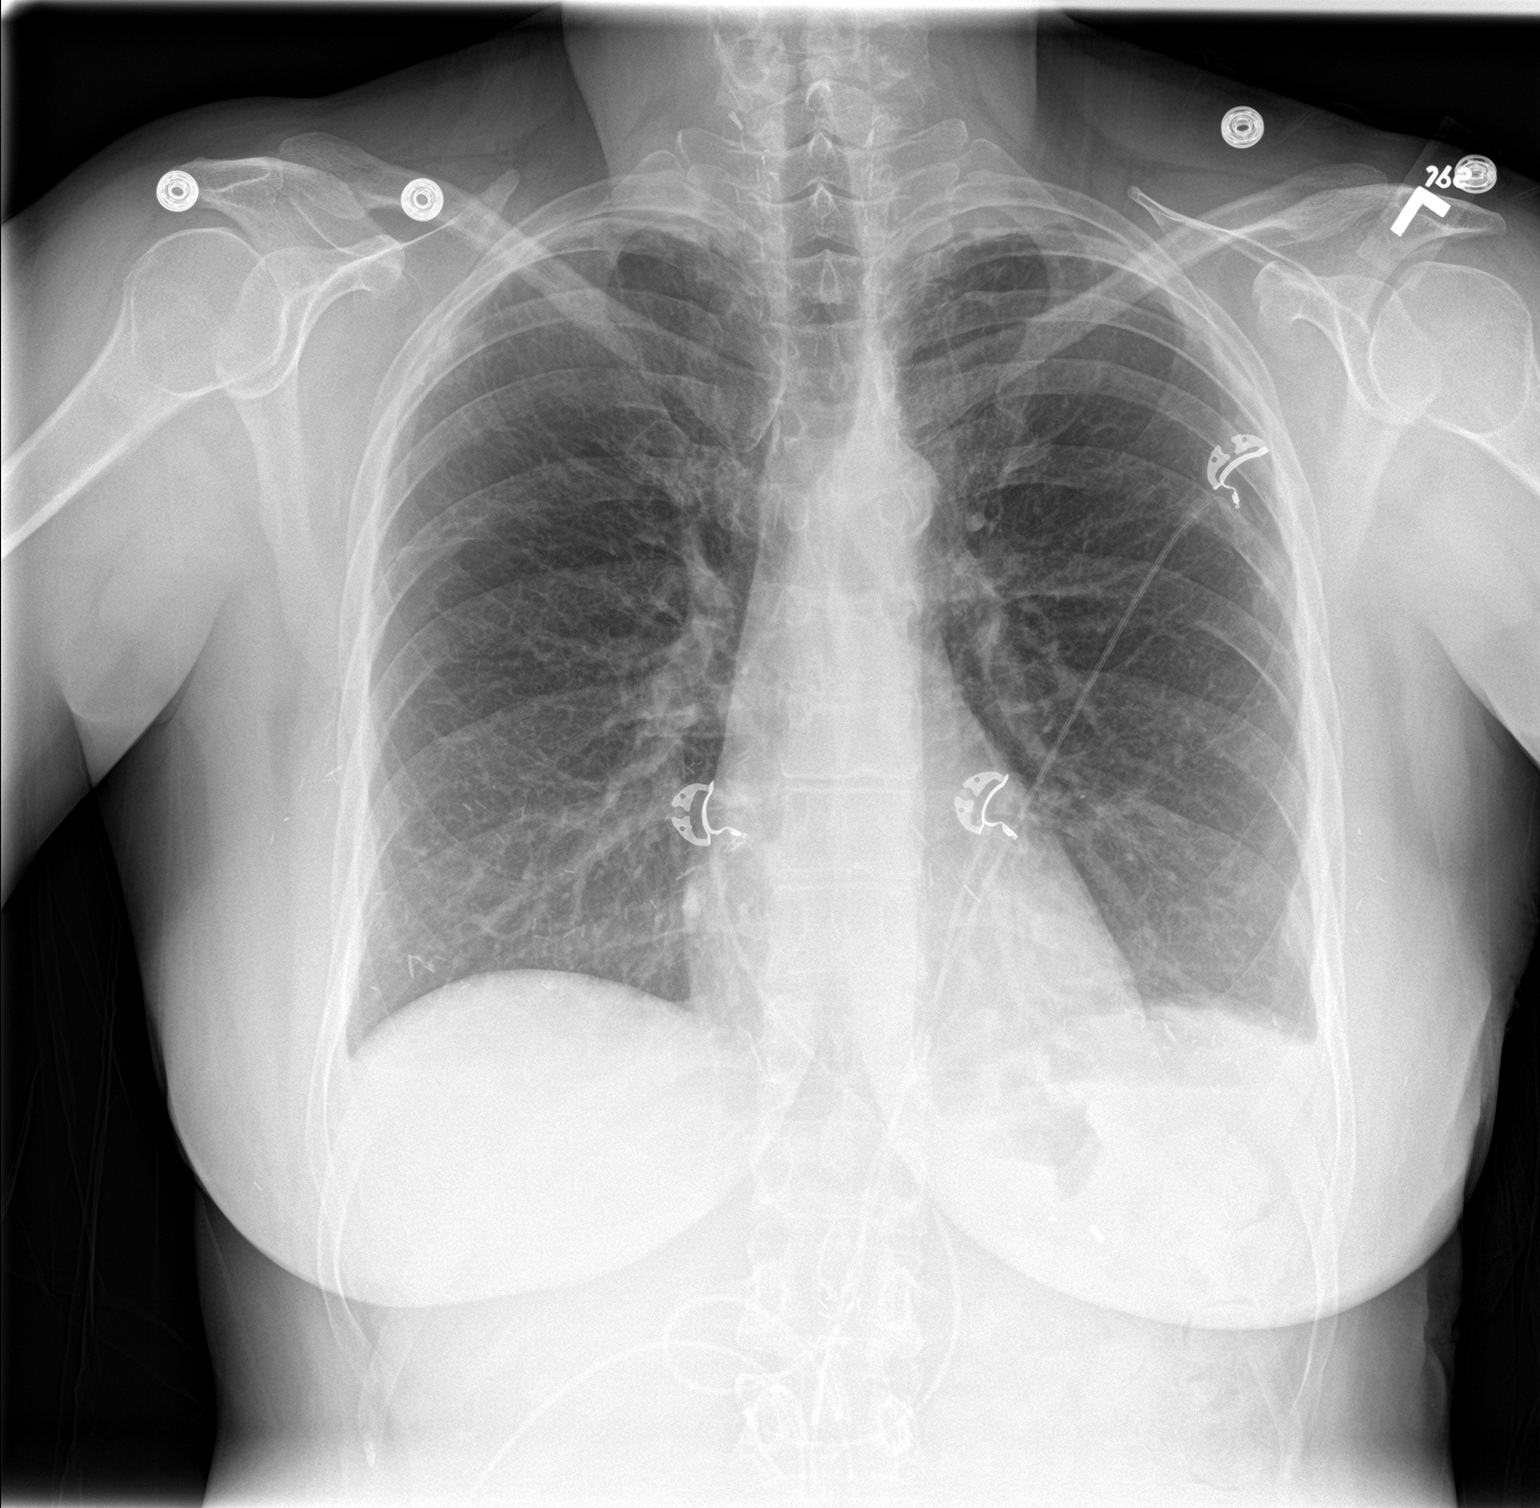

[2 of 2 positions shown; findings below may reference images not displayed]

FINDINGS: The heart size and mediastinal contours are within normal limits. No
pneumothorax is noted status post left-sided chest tube removal.
Right lung is clear. Minimal left basilar subsegmental atelectasis
and minimal left pleural effusion is noted. The visualized skeletal
structures are unremarkable.
IMPRESSION: No pneumothorax status post left-sided chest tube removal. Minimal
left basilar subsegmental atelectasis with minimal left pleural
effusion is noted.

## 2020-05-01 DIAGNOSIS — R5383 Other fatigue: Secondary | ICD-10-CM | POA: Diagnosis not present

## 2020-05-01 DIAGNOSIS — Z923 Personal history of irradiation: Secondary | ICD-10-CM | POA: Diagnosis not present

## 2020-05-01 DIAGNOSIS — Z86006 Personal history of melanoma in-situ: Secondary | ICD-10-CM | POA: Diagnosis not present

## 2020-05-01 DIAGNOSIS — Z8571 Personal history of Hodgkin lymphoma: Secondary | ICD-10-CM | POA: Diagnosis not present

## 2020-05-01 DIAGNOSIS — Z7901 Long term (current) use of anticoagulants: Secondary | ICD-10-CM | POA: Diagnosis not present

## 2020-05-01 DIAGNOSIS — E890 Postprocedural hypothyroidism: Secondary | ICD-10-CM | POA: Diagnosis not present

## 2020-05-01 DIAGNOSIS — I5022 Chronic systolic (congestive) heart failure: Secondary | ICD-10-CM | POA: Diagnosis not present

## 2020-05-01 DIAGNOSIS — Z95810 Presence of automatic (implantable) cardiac defibrillator: Secondary | ICD-10-CM | POA: Diagnosis not present

## 2020-05-01 DIAGNOSIS — I447 Left bundle-branch block, unspecified: Secondary | ICD-10-CM | POA: Diagnosis not present

## 2020-05-01 DIAGNOSIS — G3184 Mild cognitive impairment, so stated: Secondary | ICD-10-CM | POA: Diagnosis not present

## 2020-05-01 DIAGNOSIS — F458 Other somatoform disorders: Secondary | ICD-10-CM | POA: Diagnosis not present

## 2020-05-01 DIAGNOSIS — Z86711 Personal history of pulmonary embolism: Secondary | ICD-10-CM | POA: Diagnosis not present

## 2020-05-01 DIAGNOSIS — I5020 Unspecified systolic (congestive) heart failure: Secondary | ICD-10-CM | POA: Diagnosis not present

## 2020-05-01 DIAGNOSIS — Z9221 Personal history of antineoplastic chemotherapy: Secondary | ICD-10-CM | POA: Diagnosis not present

## 2020-05-01 DIAGNOSIS — E785 Hyperlipidemia, unspecified: Secondary | ICD-10-CM | POA: Diagnosis not present

## 2020-05-01 DIAGNOSIS — Z85850 Personal history of malignant neoplasm of thyroid: Secondary | ICD-10-CM | POA: Diagnosis not present

## 2020-05-01 DIAGNOSIS — Z853 Personal history of malignant neoplasm of breast: Secondary | ICD-10-CM | POA: Diagnosis not present

## 2020-05-01 DIAGNOSIS — I428 Other cardiomyopathies: Secondary | ICD-10-CM | POA: Diagnosis not present

## 2020-06-21 DIAGNOSIS — I5022 Chronic systolic (congestive) heart failure: Secondary | ICD-10-CM | POA: Diagnosis not present

## 2020-06-22 DIAGNOSIS — E785 Hyperlipidemia, unspecified: Secondary | ICD-10-CM | POA: Diagnosis not present

## 2020-06-22 DIAGNOSIS — M5413 Radiculopathy, cervicothoracic region: Secondary | ICD-10-CM | POA: Diagnosis not present

## 2020-06-22 DIAGNOSIS — R519 Headache, unspecified: Secondary | ICD-10-CM | POA: Diagnosis not present

## 2020-06-22 DIAGNOSIS — M62830 Muscle spasm of back: Secondary | ICD-10-CM | POA: Diagnosis not present

## 2020-06-22 DIAGNOSIS — M546 Pain in thoracic spine: Secondary | ICD-10-CM | POA: Diagnosis not present

## 2020-06-22 DIAGNOSIS — I5022 Chronic systolic (congestive) heart failure: Secondary | ICD-10-CM | POA: Diagnosis not present

## 2020-07-19 DIAGNOSIS — Z4502 Encounter for adjustment and management of automatic implantable cardiac defibrillator: Secondary | ICD-10-CM | POA: Diagnosis not present

## 2020-07-19 DIAGNOSIS — Z95810 Presence of automatic (implantable) cardiac defibrillator: Secondary | ICD-10-CM | POA: Diagnosis not present

## 2020-07-19 DIAGNOSIS — I43 Cardiomyopathy in diseases classified elsewhere: Secondary | ICD-10-CM | POA: Diagnosis not present

## 2020-08-03 DIAGNOSIS — E785 Hyperlipidemia, unspecified: Secondary | ICD-10-CM | POA: Diagnosis not present

## 2020-08-03 DIAGNOSIS — I5022 Chronic systolic (congestive) heart failure: Secondary | ICD-10-CM | POA: Diagnosis not present

## 2020-09-03 ENCOUNTER — Telehealth: Payer: BC Managed Care – PPO | Admitting: Family Medicine

## 2020-09-03 DIAGNOSIS — D539 Nutritional anemia, unspecified: Secondary | ICD-10-CM | POA: Diagnosis not present

## 2020-09-03 DIAGNOSIS — R072 Precordial pain: Secondary | ICD-10-CM | POA: Diagnosis not present

## 2020-09-03 DIAGNOSIS — E039 Hypothyroidism, unspecified: Secondary | ICD-10-CM | POA: Diagnosis not present

## 2020-09-03 DIAGNOSIS — Z20822 Contact with and (suspected) exposure to covid-19: Secondary | ICD-10-CM | POA: Diagnosis not present

## 2020-09-03 DIAGNOSIS — R0600 Dyspnea, unspecified: Secondary | ICD-10-CM | POA: Diagnosis not present

## 2020-09-03 DIAGNOSIS — G43909 Migraine, unspecified, not intractable, without status migrainosus: Secondary | ICD-10-CM | POA: Diagnosis not present

## 2020-09-04 DIAGNOSIS — G43909 Migraine, unspecified, not intractable, without status migrainosus: Secondary | ICD-10-CM | POA: Diagnosis not present

## 2020-09-06 ENCOUNTER — Telehealth: Payer: Self-pay

## 2020-09-06 DIAGNOSIS — F419 Anxiety disorder, unspecified: Secondary | ICD-10-CM

## 2020-09-06 DIAGNOSIS — C50511 Malignant neoplasm of lower-outer quadrant of right female breast: Secondary | ICD-10-CM

## 2020-09-06 DIAGNOSIS — F32A Depression, unspecified: Secondary | ICD-10-CM

## 2020-09-06 MED ORDER — BUPROPION HCL ER (XL) 150 MG PO TB24: 450.0000 mg | ORAL_TABLET | ORAL | 3 refills | Status: DC

## 2020-09-06 NOTE — Telephone Encounter (Signed)
Switching to 150 XL, 3 tabs daily.

## 2020-09-06 NOTE — Telephone Encounter (Signed)
Call from [place] stating that insurance is no longer going to cover the Bupropion 450mg XL tabs. They will cover: bupropion 300mg XL and bupropion 150mg  XL, as well as, bupropion 100mg  SR, bupropion 150mg  SR, & bupropion 200mg  SR. They need a new order for whatever alternative you would like to use.   [facility] [phone] Ref# [id number]

## 2020-09-06 NOTE — Telephone Encounter (Signed)
[facility] aware of the substitution.   Left message for patient to return call to office regarding prescription.

## 2020-09-07 ENCOUNTER — Other Ambulatory Visit: Payer: Self-pay

## 2020-09-07 DIAGNOSIS — C50511 Malignant neoplasm of lower-outer quadrant of right female breast: Secondary | ICD-10-CM

## 2020-09-07 MED ORDER — BUPROPION HCL ER (XL) 450 MG PO TB24: 450.0000 mg | ORAL_TABLET | Freq: Every day | ORAL | 3 refills | Status: DC

## 2020-09-07 NOTE — Telephone Encounter (Signed)
Spoke with patient, she had been in contact with the insurance company herself. She gave me the key code for a PA in covermymeds for the bupropion 450mg  XL. PA submitted and obtained immediate response with approval. New prescription for the bupropion 450mg  XL sent to [facility] and the bupropion 150mg  XL discontinued.

## 2020-09-10 ENCOUNTER — Ambulatory Visit (INDEPENDENT_AMBULATORY_CARE_PROVIDER_SITE_OTHER): Payer: BC Managed Care – PPO

## 2020-09-10 ENCOUNTER — Encounter: Payer: Self-pay | Admitting: Sports Medicine

## 2020-09-10 ENCOUNTER — Other Ambulatory Visit: Payer: Self-pay

## 2020-09-10 ENCOUNTER — Ambulatory Visit (INDEPENDENT_AMBULATORY_CARE_PROVIDER_SITE_OTHER): Payer: BC Managed Care – PPO | Admitting: Sports Medicine

## 2020-09-10 ENCOUNTER — Other Ambulatory Visit: Payer: Self-pay | Admitting: Sports Medicine

## 2020-09-10 DIAGNOSIS — E039 Hypothyroidism, unspecified: Secondary | ICD-10-CM | POA: Diagnosis not present

## 2020-09-10 DIAGNOSIS — R221 Localized swelling, mass and lump, neck: Secondary | ICD-10-CM

## 2020-09-10 DIAGNOSIS — R911 Solitary pulmonary nodule: Secondary | ICD-10-CM | POA: Diagnosis not present

## 2020-09-10 DIAGNOSIS — Z950 Presence of cardiac pacemaker: Secondary | ICD-10-CM | POA: Diagnosis not present

## 2020-09-10 DIAGNOSIS — Z85850 Personal history of malignant neoplasm of thyroid: Secondary | ICD-10-CM | POA: Diagnosis not present

## 2020-09-10 DIAGNOSIS — E890 Postprocedural hypothyroidism: Secondary | ICD-10-CM | POA: Diagnosis not present

## 2020-09-10 DIAGNOSIS — E041 Nontoxic single thyroid nodule: Secondary | ICD-10-CM | POA: Diagnosis not present

## 2020-09-10 DIAGNOSIS — I700 Atherosclerosis of aorta: Secondary | ICD-10-CM | POA: Diagnosis not present

## 2020-09-10 MED ORDER — AZITHROMYCIN 250 MG PO TABS: ORAL_TABLET | ORAL | 0 refills | Status: DC

## 2020-09-10 NOTE — Progress Notes (Addendum)
    Procedures performed today:    None.  Independent interpretation of notes and tests performed by another provider:   None.  Brief History, Exam, Impression, and Recommendations:    Mass in neck Nashiya for 2 days now has had a painful slightly reddish nodule in the subcutaneous tissues in her anterior neck in the midline. It is clearly palpable, she is post thyroidectomy for thyroid cancer in the distant past. We will add azithromycin considering how painful this is, and I would like an ultrasound to ensure that this is not a thyroid remnant, or a worrisome lymph node.  I would also like to see her back for this in about a month. Ultimately I do think we will be getting a CT of the neck with and without contrast.  Mass appears to be a sebaceous cyst on ultrasound.  We will give antibiotics some time and then reevaluate at the follow-up. I think if it still present we should consider CT +/- biopsy.  Hypothyroidism Recently had a TSH done that was somewhat low, she is still taking 150 mcg of levothyroxine, rechecking T3, T4, TSH. She is post thyroidectomy.  TSH is low, we do need to decrease levothyroxine to 137 mcg. Recheck in 6 weeks.    ___________________________________________ Gwen Her. Dianah Field, M.D., ABFM., CAQSM. Primary Care and [place] Instructor of [place] of [facility] of Medicine

## 2020-09-10 NOTE — Addendum Note (Signed)
Addended by: [name] on: 09/10/2020 12:21 PM   Modules accepted: Orders

## 2020-09-10 NOTE — Assessment & Plan Note (Addendum)
Stacy Chung for 2 days now has had a painful slightly reddish nodule in the subcutaneous tissues in her anterior neck in the midline. It is clearly palpable, she is post thyroidectomy for thyroid cancer in the distant past. We will add azithromycin considering how painful this is, and I would like an ultrasound to ensure that this is not a thyroid remnant, or a worrisome lymph node.  I would also like to see her back for this in about a month. Ultimately I do think we will be getting a CT of the neck with and without contrast.  Mass appears to be a sebaceous cyst on ultrasound.  We will give antibiotics some time and then reevaluate at the follow-up. I think if it still present we should consider CT +/- biopsy.

## 2020-09-10 NOTE — Assessment & Plan Note (Addendum)
Recently had a TSH done that was somewhat low, she is still taking 150 mcg of levothyroxine, rechecking T3, T4, TSH. She is post thyroidectomy.  TSH is low, we do need to decrease levothyroxine to 137 mcg. Recheck in 6 weeks.

## 2020-09-11 DIAGNOSIS — R5383 Other fatigue: Secondary | ICD-10-CM | POA: Diagnosis not present

## 2020-09-11 DIAGNOSIS — G3184 Mild cognitive impairment, so stated: Secondary | ICD-10-CM | POA: Diagnosis not present

## 2020-09-18 DIAGNOSIS — E039 Hypothyroidism, unspecified: Secondary | ICD-10-CM | POA: Diagnosis not present

## 2020-09-19 LAB — CBC WITH DIFFERENTIAL/PLATELET
Basophils Absolute: 0.1 10*3/uL (ref 0.0–0.2)
Basos: 2 %
EOS (ABSOLUTE): 0.3 10*3/uL (ref 0.0–0.4)
Eos: 3 %
Hematocrit: 40.7 % (ref 34.0–46.6)
Hemoglobin: 13.7 g/dL (ref 11.1–15.9)
Immature Grans (Abs): 0 10*3/uL (ref 0.0–0.1)
Immature Granulocytes: 0 %
Lymphocytes Absolute: 2.4 10*3/uL (ref 0.7–3.1)
Lymphs: 31 %
MCH: 30.9 pg (ref 26.6–33.0)
MCHC: 33.7 g/dL (ref 31.5–35.7)
MCV: 92 fL (ref 79–97)
Monocytes Absolute: 0.7 10*3/uL (ref 0.1–0.9)
Monocytes: 9 %
Neutrophils Absolute: 4.4 10*3/uL (ref 1.4–7.0)
Neutrophils: 55 %
Platelets: 399 10*3/uL (ref 150–450)
RBC: 4.43 x10E6/uL (ref 3.77–5.28)
RDW: 13.2 % (ref 11.7–15.4)
WBC: 7.9 10*3/uL (ref 3.4–10.8)

## 2020-09-19 LAB — T4, FREE: Free T4: 1.65 ng/dL (ref 0.82–1.77)

## 2020-09-19 LAB — COMPREHENSIVE METABOLIC PANEL
ALT: 91 IU/L — ABNORMAL HIGH (ref 0–32)
AST: 71 IU/L — ABNORMAL HIGH (ref 0–40)
Albumin/Globulin Ratio: 1.6 (ref 1.2–2.2)
Albumin: 4.4 g/dL (ref 3.8–4.8)
Alkaline Phosphatase: 158 IU/L — ABNORMAL HIGH (ref 44–121)
BUN/Creatinine Ratio: 19 (ref 9–23)
BUN: 18 mg/dL (ref 6–24)
Bilirubin Total: 0.2 mg/dL (ref 0.0–1.2)
CO2: 18 mmol/L — ABNORMAL LOW (ref 20–29)
Calcium: 9.8 mg/dL (ref 8.7–10.2)
Chloride: 101 mmol/L (ref 96–106)
Creatinine, Ser: 0.97 mg/dL (ref 0.57–1.00)
Globulin, Total: 2.7 g/dL (ref 1.5–4.5)
Glucose: 100 mg/dL — ABNORMAL HIGH (ref 65–99)
Potassium: 4.7 mmol/L (ref 3.5–5.2)
Sodium: 137 mmol/L (ref 134–144)
Total Protein: 7.1 g/dL (ref 6.0–8.5)
eGFR: 72 mL/min/{1.73_m2} (ref >59)

## 2020-09-19 LAB — TSH: TSH: 0.07 u[IU]/mL — ABNORMAL LOW (ref 0.450–4.500)

## 2020-09-19 LAB — T3, FREE: T3, Free: 3 pg/mL (ref 2.0–4.4)

## 2020-09-19 MED ORDER — LEVOTHYROXINE SODIUM 137 MCG PO TABS: 137.0000 ug | ORAL_TABLET | Freq: Every day | ORAL | 3 refills | Status: DC

## 2020-09-19 NOTE — Addendum Note (Signed)
Addended by: [name] on: 09/19/2020 09:06 AM   Modules accepted: Orders

## 2020-09-22 ENCOUNTER — Emergency Department (HOSPITAL_BASED_OUTPATIENT_CLINIC_OR_DEPARTMENT_OTHER): Payer: BC Managed Care – PPO

## 2020-09-22 ENCOUNTER — Other Ambulatory Visit: Payer: Self-pay

## 2020-09-22 ENCOUNTER — Encounter (HOSPITAL_BASED_OUTPATIENT_CLINIC_OR_DEPARTMENT_OTHER): Payer: Self-pay | Admitting: Emergency Medicine

## 2020-09-22 ENCOUNTER — Emergency Department (HOSPITAL_BASED_OUTPATIENT_CLINIC_OR_DEPARTMENT_OTHER)
Admission: EM | Admit: 2020-09-22 | Discharge: 2020-09-22 | Disposition: A | Discharge status: Home / Self Care | Payer: BC Managed Care – PPO | Attending: Emergency Medicine | Admitting: Emergency Medicine

## 2020-09-22 DIAGNOSIS — Z041 Encounter for examination and observation following transport accident: Secondary | ICD-10-CM | POA: Diagnosis not present

## 2020-09-22 DIAGNOSIS — S161XXA Strain of muscle, fascia and tendon at neck level, initial encounter: Secondary | ICD-10-CM | POA: Diagnosis not present

## 2020-09-22 DIAGNOSIS — I5021 Acute systolic (congestive) heart failure: Secondary | ICD-10-CM | POA: Diagnosis not present

## 2020-09-22 DIAGNOSIS — Y92481 Parking lot as the place of occurrence of the external cause: Secondary | ICD-10-CM | POA: Diagnosis not present

## 2020-09-22 DIAGNOSIS — S46811A Strain of other muscles, fascia and tendons at shoulder and upper arm level, right arm, initial encounter: Secondary | ICD-10-CM | POA: Insufficient documentation

## 2020-09-22 DIAGNOSIS — Z85828 Personal history of other malignant neoplasm of skin: Secondary | ICD-10-CM | POA: Diagnosis not present

## 2020-09-22 DIAGNOSIS — E039 Hypothyroidism, unspecified: Secondary | ICD-10-CM | POA: Diagnosis not present

## 2020-09-22 DIAGNOSIS — M542 Cervicalgia: Secondary | ICD-10-CM | POA: Insufficient documentation

## 2020-09-22 DIAGNOSIS — Z7901 Long term (current) use of anticoagulants: Secondary | ICD-10-CM | POA: Insufficient documentation

## 2020-09-22 DIAGNOSIS — Z85850 Personal history of malignant neoplasm of thyroid: Secondary | ICD-10-CM | POA: Diagnosis not present

## 2020-09-22 DIAGNOSIS — R0789 Other chest pain: Secondary | ICD-10-CM | POA: Diagnosis not present

## 2020-09-22 DIAGNOSIS — Z91040 Latex allergy status: Secondary | ICD-10-CM | POA: Insufficient documentation

## 2020-09-22 DIAGNOSIS — R42 Dizziness and giddiness: Secondary | ICD-10-CM | POA: Insufficient documentation

## 2020-09-22 DIAGNOSIS — Z950 Presence of cardiac pacemaker: Secondary | ICD-10-CM | POA: Diagnosis not present

## 2020-09-22 DIAGNOSIS — Z79899 Other long term (current) drug therapy: Secondary | ICD-10-CM | POA: Diagnosis not present

## 2020-09-22 DIAGNOSIS — M546 Pain in thoracic spine: Secondary | ICD-10-CM | POA: Diagnosis not present

## 2020-09-22 DIAGNOSIS — Z853 Personal history of malignant neoplasm of breast: Secondary | ICD-10-CM | POA: Insufficient documentation

## 2020-09-22 DIAGNOSIS — T148XXA Other injury of unspecified body region, initial encounter: Secondary | ICD-10-CM

## 2020-09-22 DIAGNOSIS — S4991XA Unspecified injury of right shoulder and upper arm, initial encounter: Secondary | ICD-10-CM | POA: Diagnosis present

## 2020-09-22 HISTORY — DX: Heart failure, unspecified: I50.9
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
## 2020-09-22 NOTE — ED Notes (Signed)
Patient ambulated back from CT. Placed in stretcher. Resting comfortably.

## 2020-09-22 NOTE — Discharge Instructions (Addendum)
Like we discussed, please take Tylenol as needed for management of your pain.  I would also recommend applying heating pads to your neck and shoulders over the next 2 to 3 days, as needed.  Please follow-up with your regular doctor.  If your symptoms worsen, please return to the emergency department.  It was a pleasure to meet you both.

## 2020-09-22 NOTE — ED Notes (Signed)
Patient ambulated to CT
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
## 2020-09-22 NOTE — ED Notes (Signed)
Laying flat for [employer]

## 2020-09-28 ENCOUNTER — Telehealth: Payer: Self-pay

## 2020-09-28 NOTE — Telephone Encounter (Signed)
PA sent for levothyroxine (SYNTHROID) 137 MCG tablet

## 2020-10-08 ENCOUNTER — Ambulatory Visit (INDEPENDENT_AMBULATORY_CARE_PROVIDER_SITE_OTHER): Payer: BC Managed Care – PPO | Admitting: Sports Medicine

## 2020-10-08 ENCOUNTER — Encounter: Payer: Self-pay | Admitting: Sports Medicine

## 2020-10-08 ENCOUNTER — Other Ambulatory Visit: Payer: Self-pay

## 2020-10-08 VITALS — BP 88/54 | HR 70 | Ht 65.0 in | Wt 162.0 lb

## 2020-10-08 DIAGNOSIS — E039 Hypothyroidism, unspecified: Secondary | ICD-10-CM | POA: Diagnosis not present

## 2020-10-08 DIAGNOSIS — I5021 Acute systolic (congestive) heart failure: Secondary | ICD-10-CM

## 2020-10-08 DIAGNOSIS — G4719 Other hypersomnia: Secondary | ICD-10-CM | POA: Diagnosis not present

## 2020-10-08 DIAGNOSIS — R400 Somnolence: Secondary | ICD-10-CM | POA: Diagnosis not present

## 2020-10-08 DIAGNOSIS — R221 Localized swelling, mass and lump, neck: Secondary | ICD-10-CM

## 2020-10-08 DIAGNOSIS — R7401 Elevation of levels of liver transaminase levels: Secondary | ICD-10-CM

## 2020-10-08 MED ORDER — LEVOTHYROXINE SODIUM 137 MCG PO TABS: 137.0000 ug | ORAL_TABLET | Freq: Every day | ORAL | 3 refills | Status: DC

## 2020-10-08 NOTE — Progress Notes (Addendum)
    Procedures performed today:    None.  Independent interpretation of notes and tests performed by another provider:   None.  Brief History, Exam, Impression, and Recommendations:    Acute systolic heart failure with apical thrombus Stacy Chung has acute heart failure, she was having significant hypertension so we had to discontinue a lot of her heart failure medications, ejection fraction has continued to recover however she is starting to have increasing low blood pressures, tiredness, orthopnea. Given to check some labs and an updated echocardiogram.  Daytime sleepiness with cognitive decline Unclear etiology, she does have a complicated history of breast cancer, lymphoma, pelvic cancer. Excessive daytime tiredness, sleepiness, cognitive decline. She has seen a neuropsychologist. Ultimately there may be a component of uncontrolled depression here, she does continue with her antidepressant. Normal brain MRI. Adding a sleep study. Ultimately we may have to add a stimulant.  Mass in neck Does appear to be predominantly subcutaneous, improved with antibiotics, she has gotten some drainage out with manipulation. Ultrasound showed potential sebaceous cyst. I would like to going to refer her to ENT for surgical excision.  Transaminitis Cholecystectomy, persistent transaminitis improving. History of focal nodular hyperplasia on previous CT, proceeding with hepatic ultrasound for follow-up.  I spent 40 minutes of total time managing this patient today, this includes chart review, face to face, and non-face to face time.  Specifically talked about cognitive decline and excessive sleepiness and multiple medical problems.   ___________________________________________ Gwen Her. Dianah Field, M.D., ABFM., CAQSM. Primary Care and [place] Instructor of [place] of [facility] of Medicine

## 2020-10-08 NOTE — Assessment & Plan Note (Addendum)
Unclear etiology, she does have a complicated history of breast cancer, lymphoma, pelvic cancer. Excessive daytime tiredness, sleepiness, cognitive decline. She has seen a neuropsychologist. Ultimately there may be a component of uncontrolled depression here, she does continue with her antidepressant. Normal brain MRI. Adding a sleep study. Ultimately we may have to add a stimulant.

## 2020-10-08 NOTE — Assessment & Plan Note (Signed)
Does appear to be predominantly subcutaneous, improved with antibiotics, she has gotten some drainage out with manipulation. Ultrasound showed potential sebaceous cyst. I would like to going to refer her to ENT for surgical excision.

## 2020-10-08 NOTE — Addendum Note (Signed)
Addended by: [name] on: 10/08/2020 12:14 PM   Modules accepted: Orders

## 2020-10-08 NOTE — Assessment & Plan Note (Signed)
Stacy Chung has acute heart failure, she was having significant hypertension so we had to discontinue a lot of her heart failure medications, ejection fraction has continued to recover however she is starting to have increasing low blood pressures, tiredness, orthopnea. Given to check some labs and an updated echocardiogram.

## 2020-10-09 DIAGNOSIS — R7401 Elevation of levels of liver transaminase levels: Secondary | ICD-10-CM | POA: Insufficient documentation

## 2020-10-09 LAB — CBC
HCT: 43.2 % (ref 35.0–45.0)
Hemoglobin: 14.3 g/dL (ref 11.7–15.5)
MCH: 30.4 pg (ref 27.0–33.0)
MCHC: 33.1 g/dL (ref 32.0–36.0)
MCV: 91.7 fL (ref 80.0–100.0)
MPV: 9.8 fL (ref 7.5–12.5)
Platelets: 365 10*3/uL (ref 140–400)
RBC: 4.71 10*6/uL (ref 3.80–5.10)
RDW: 12.8 % (ref 11.0–15.0)
WBC: 7.3 10*3/uL (ref 3.8–10.8)

## 2020-10-09 LAB — COMPREHENSIVE METABOLIC PANEL
AG Ratio: 1.6 (calc) (ref 1.0–2.5)
ALT: 87 U/L — ABNORMAL HIGH (ref 6–29)
AST: 68 U/L — ABNORMAL HIGH (ref 10–35)
Albumin: 4.7 g/dL (ref 3.6–5.1)
Alkaline phosphatase (APISO): 116 U/L (ref 31–125)
BUN: 17 mg/dL (ref 7–25)
CO2: 24 mmol/L (ref 20–32)
Calcium: 9.6 mg/dL (ref 8.6–10.2)
Chloride: 104 mmol/L (ref 98–110)
Creat: 0.95 mg/dL (ref 0.50–1.10)
Globulin: 2.9 g/dL (calc) (ref 1.9–3.7)
Glucose, Bld: 88 mg/dL (ref 65–99)
Potassium: 4.7 mmol/L (ref 3.5–5.3)
Sodium: 140 mmol/L (ref 135–146)
Total Bilirubin: 0.5 mg/dL (ref 0.2–1.2)
Total Protein: 7.6 g/dL (ref 6.1–8.1)

## 2020-10-09 LAB — BRAIN NATRIURETIC PEPTIDE: Brain Natriuretic Peptide: 30 pg/mL (ref <100)

## 2020-10-09 NOTE — Assessment & Plan Note (Signed)
Cholecystectomy, persistent transaminitis improving. History of focal nodular hyperplasia on previous CT, proceeding with hepatic ultrasound for follow-up.

## 2020-10-09 NOTE — Addendum Note (Signed)
Addended by: [name] on: 10/09/2020 08:50 AM   Modules accepted: Orders

## 2020-10-10 ENCOUNTER — Other Ambulatory Visit: Payer: Self-pay | Admitting: Sports Medicine

## 2020-10-10 ENCOUNTER — Other Ambulatory Visit: Payer: BC Managed Care – PPO

## 2020-10-10 ENCOUNTER — Other Ambulatory Visit: Payer: Self-pay

## 2020-10-10 ENCOUNTER — Ambulatory Visit (INDEPENDENT_AMBULATORY_CARE_PROVIDER_SITE_OTHER): Payer: BC Managed Care – PPO

## 2020-10-10 DIAGNOSIS — E039 Hypothyroidism, unspecified: Secondary | ICD-10-CM

## 2020-10-10 DIAGNOSIS — R7401 Elevation of levels of liver transaminase levels: Secondary | ICD-10-CM

## 2020-10-10 DIAGNOSIS — R7989 Other specified abnormal findings of blood chemistry: Secondary | ICD-10-CM | POA: Diagnosis not present

## 2020-10-18 DIAGNOSIS — I428 Other cardiomyopathies: Secondary | ICD-10-CM | POA: Diagnosis not present

## 2020-10-18 DIAGNOSIS — Z4502 Encounter for adjustment and management of automatic implantable cardiac defibrillator: Secondary | ICD-10-CM | POA: Diagnosis not present

## 2020-10-19 DIAGNOSIS — I428 Other cardiomyopathies: Secondary | ICD-10-CM | POA: Diagnosis not present

## 2020-10-19 DIAGNOSIS — Z95810 Presence of automatic (implantable) cardiac defibrillator: Secondary | ICD-10-CM | POA: Diagnosis not present

## 2020-10-19 DIAGNOSIS — Z4502 Encounter for adjustment and management of automatic implantable cardiac defibrillator: Secondary | ICD-10-CM | POA: Diagnosis not present

## 2020-10-24 ENCOUNTER — Other Ambulatory Visit: Payer: Self-pay

## 2020-10-24 ENCOUNTER — Ambulatory Visit (HOSPITAL_BASED_OUTPATIENT_CLINIC_OR_DEPARTMENT_OTHER)
Admission: RE | Admit: 2020-10-24 | Discharge: 2020-10-24 | Disposition: A | Discharge status: Home / Self Care | Payer: BC Managed Care – PPO | Source: Ambulatory Visit | Attending: Sports Medicine | Admitting: Sports Medicine

## 2020-10-24 DIAGNOSIS — I5021 Acute systolic (congestive) heart failure: Secondary | ICD-10-CM | POA: Insufficient documentation

## 2020-10-24 LAB — ECHOCARDIOGRAM COMPLETE
AR max vel: 1.8 cm2
AV Area VTI: 1.9 cm2
AV Area mean vel: 1.55 cm2
AV Mean grad: 4 mmHg
AV Peak grad: 5.9 mmHg
Ao pk vel: 1.21 m/s
Area-P 1/2: 4.21 cm2
S' Lateral: 3.59 cm
Single Plane A4C EF: 49.1 %

## 2020-11-02 DIAGNOSIS — I5022 Chronic systolic (congestive) heart failure: Secondary | ICD-10-CM | POA: Diagnosis not present

## 2020-11-02 DIAGNOSIS — I5020 Unspecified systolic (congestive) heart failure: Secondary | ICD-10-CM | POA: Diagnosis not present

## 2020-11-22 DIAGNOSIS — I429 Cardiomyopathy, unspecified: Secondary | ICD-10-CM | POA: Diagnosis not present

## 2020-11-22 DIAGNOSIS — I428 Other cardiomyopathies: Secondary | ICD-10-CM | POA: Diagnosis not present

## 2020-11-22 DIAGNOSIS — Z95810 Presence of automatic (implantable) cardiac defibrillator: Secondary | ICD-10-CM | POA: Diagnosis not present

## 2020-11-22 DIAGNOSIS — E039 Hypothyroidism, unspecified: Secondary | ICD-10-CM | POA: Diagnosis not present

## 2020-11-22 DIAGNOSIS — R002 Palpitations: Secondary | ICD-10-CM | POA: Diagnosis not present

## 2020-11-22 DIAGNOSIS — I5022 Chronic systolic (congestive) heart failure: Secondary | ICD-10-CM | POA: Diagnosis not present

## 2020-11-22 DIAGNOSIS — Z45018 Encounter for adjustment and management of other part of cardiac pacemaker: Secondary | ICD-10-CM | POA: Diagnosis not present

## 2020-11-22 DIAGNOSIS — I959 Hypotension, unspecified: Secondary | ICD-10-CM | POA: Diagnosis not present

## 2020-11-22 DIAGNOSIS — Z853 Personal history of malignant neoplasm of breast: Secondary | ICD-10-CM | POA: Diagnosis not present

## 2020-11-22 DIAGNOSIS — Z79899 Other long term (current) drug therapy: Secondary | ICD-10-CM | POA: Diagnosis not present

## 2020-11-22 DIAGNOSIS — I447 Left bundle-branch block, unspecified: Secondary | ICD-10-CM | POA: Diagnosis not present

## 2020-11-22 DIAGNOSIS — Z923 Personal history of irradiation: Secondary | ICD-10-CM | POA: Diagnosis not present

## 2020-11-22 DIAGNOSIS — Z7901 Long term (current) use of anticoagulants: Secondary | ICD-10-CM | POA: Diagnosis not present

## 2020-11-22 DIAGNOSIS — I110 Hypertensive heart disease with heart failure: Secondary | ICD-10-CM | POA: Diagnosis not present

## 2020-11-22 DIAGNOSIS — I513 Intracardiac thrombosis, not elsewhere classified: Secondary | ICD-10-CM | POA: Diagnosis not present

## 2020-11-22 DIAGNOSIS — Z79890 Hormone replacement therapy (postmenopausal): Secondary | ICD-10-CM | POA: Diagnosis not present

## 2020-11-30 ENCOUNTER — Encounter (INDEPENDENT_AMBULATORY_CARE_PROVIDER_SITE_OTHER): Payer: BC Managed Care – PPO

## 2020-11-30 DIAGNOSIS — E039 Hypothyroidism, unspecified: Secondary | ICD-10-CM

## 2020-11-30 DIAGNOSIS — M7989 Other specified soft tissue disorders: Secondary | ICD-10-CM | POA: Diagnosis not present

## 2020-11-30 DIAGNOSIS — I5022 Chronic systolic (congestive) heart failure: Secondary | ICD-10-CM | POA: Diagnosis not present

## 2020-11-30 DIAGNOSIS — I890 Lymphedema, not elsewhere classified: Secondary | ICD-10-CM | POA: Diagnosis not present

## 2020-11-30 MED ORDER — LEVOTHYROXINE SODIUM 125 MCG PO TABS: 125.0000 ug | ORAL_TABLET | Freq: Every day | ORAL | 3 refills | Status: DC

## 2020-11-30 NOTE — Telephone Encounter (Signed)
I spent 5 total minutes of online digital evaluation and management services. 

## 2020-11-30 NOTE — Assessment & Plan Note (Signed)
TSH still low at an outside facility, decreasing levothyroxine to 125 mcg and rechecking in 6 weeks.

## 2020-12-03 ENCOUNTER — Other Ambulatory Visit: Payer: Self-pay | Admitting: *Deleted

## 2020-12-03 DIAGNOSIS — E039 Hypothyroidism, unspecified: Secondary | ICD-10-CM

## 2020-12-03 MED ORDER — LEVOTHYROXINE SODIUM 125 MCG PO TABS: 125.0000 ug | ORAL_TABLET | Freq: Every day | ORAL | 0 refills | Status: DC

## 2021-01-24 DIAGNOSIS — I428 Other cardiomyopathies: Secondary | ICD-10-CM | POA: Diagnosis not present

## 2021-01-24 DIAGNOSIS — I43 Cardiomyopathy in diseases classified elsewhere: Secondary | ICD-10-CM | POA: Diagnosis not present

## 2021-01-24 DIAGNOSIS — Z4502 Encounter for adjustment and management of automatic implantable cardiac defibrillator: Secondary | ICD-10-CM | POA: Diagnosis not present

## 2021-01-31 DIAGNOSIS — R5383 Other fatigue: Secondary | ICD-10-CM | POA: Diagnosis not present

## 2021-01-31 DIAGNOSIS — R300 Dysuria: Secondary | ICD-10-CM | POA: Diagnosis not present

## 2021-01-31 DIAGNOSIS — A048 Other specified bacterial intestinal infections: Secondary | ICD-10-CM | POA: Diagnosis not present

## 2021-01-31 DIAGNOSIS — K219 Gastro-esophageal reflux disease without esophagitis: Secondary | ICD-10-CM | POA: Diagnosis not present

## 2021-01-31 DIAGNOSIS — Z79899 Other long term (current) drug therapy: Secondary | ICD-10-CM | POA: Diagnosis not present

## 2021-02-04 ENCOUNTER — Encounter (INDEPENDENT_AMBULATORY_CARE_PROVIDER_SITE_OTHER): Payer: BC Managed Care – PPO

## 2021-02-04 DIAGNOSIS — E039 Hypothyroidism, unspecified: Secondary | ICD-10-CM

## 2021-02-05 MED ORDER — LEVOTHYROXINE SODIUM 112 MCG PO TABS: 112.0000 ug | ORAL_TABLET | Freq: Every day | ORAL | 3 refills | Status: DC

## 2021-02-05 NOTE — Assessment & Plan Note (Signed)
Per patient [place] email on 02/05/2021 TSH is still low at 0.08 at an outside facility on 125 mcg levothyroxine, decreasing to 112 mcg with a 6-week TSH recheck.

## 2021-02-05 NOTE — Telephone Encounter (Signed)
I spent 5 total minutes of online digital evaluation and management services. 

## 2021-02-06 DIAGNOSIS — I083 Combined rheumatic disorders of mitral, aortic and tricuspid valves: Secondary | ICD-10-CM | POA: Diagnosis not present

## 2021-02-06 DIAGNOSIS — I5022 Chronic systolic (congestive) heart failure: Secondary | ICD-10-CM | POA: Diagnosis not present

## 2021-02-06 MED ORDER — LEVOTHYROXINE SODIUM 112 MCG PO TABS: 112.0000 ug | ORAL_TABLET | Freq: Every day | ORAL | 1 refills | Status: DC

## 2021-02-06 NOTE — Addendum Note (Signed)
Addended by: Narda Rutherford on: 02/06/2021 07:44 AM   Modules accepted: Orders

## 2021-02-28 ENCOUNTER — Other Ambulatory Visit (HOSPITAL_COMMUNITY): Payer: Self-pay

## 2021-02-28 ENCOUNTER — Telehealth (HOSPITAL_COMMUNITY): Payer: Self-pay | Admitting: Pharmacy Technician

## 2021-02-28 NOTE — Telephone Encounter (Signed)
Patient Advocate Encounter   Received notification from [employer] that prior authorization for Eliquis is required.   PA submitted on CoverMyMeds Key [id number] Status is pending   Will continue to follow.

## 2021-03-05 MED ORDER — CLONAZEPAM 0.5 MG PO TABS: ORAL_TABLET | ORAL | 0 refills | Status: DC

## 2021-03-13 ENCOUNTER — Other Ambulatory Visit: Payer: Self-pay

## 2021-03-13 ENCOUNTER — Telehealth (INDEPENDENT_AMBULATORY_CARE_PROVIDER_SITE_OTHER): Payer: BC Managed Care – PPO | Admitting: Sports Medicine

## 2021-03-13 DIAGNOSIS — F419 Anxiety disorder, unspecified: Secondary | ICD-10-CM | POA: Diagnosis not present

## 2021-03-13 DIAGNOSIS — F32A Depression, unspecified: Secondary | ICD-10-CM

## 2021-03-13 MED ORDER — ESCITALOPRAM OXALATE 10 MG PO TABS: 10.0000 mg | ORAL_TABLET | Freq: Every day | ORAL | 3 refills | Status: DC

## 2021-03-13 MED ORDER — METOPROLOL SUCCINATE ER 25 MG PO TB24: 12.5000 mg | ORAL_TABLET | Freq: Two times a day (BID) | ORAL | 3 refills | Status: DC

## 2021-03-13 NOTE — Assessment & Plan Note (Addendum)
Stacy Chung is touching base regarding grieving, her mother recently passed, she is doing okay, the services done, she is able to remain functional, we did refill her clonazepam. She is interested in going up on the Lexapro maybe for the next couple of months, happy to do this.  I have also asked her to advise me if and when she is ready for grief counseling. She can return to see me at the beginning of 2023 for an annual physical.

## 2021-03-13 NOTE — Progress Notes (Signed)
   Virtual Visit via WebEx/MyChart   I connected with  Stacy Chung  on 03/13/21 via WebEx/MyChart/Doximity Video and verified that I am speaking with the correct person using two identifiers.   I discussed the limitations, risks, security and privacy concerns of performing an evaluation and management service by WebEx/MyChart/Doximity Video, including the higher likelihood of inaccurate diagnosis and treatment, and the availability of in person appointments.  We also discussed the likely need of an additional face to face encounter for complete and high quality delivery of care.  I also discussed with the patient that there may be a patient responsible charge related to this service. The patient expressed understanding and wishes to proceed.  Provider location is in medical facility. Patient location is at their home, different from provider location. People involved in care of the patient during this telehealth encounter were myself, my nurse/medical assistant, and my front office/scheduling team member.  Review of Systems: No fevers, chills, night sweats, weight loss, chest pain, or shortness of breath.   Objective Findings:    General: Speaking full sentences, no audible heavy breathing.  Sounds alert and appropriately interactive.  Appears well.  Face symmetric.  Extraocular movements intact.  Pupils equal and round.  No nasal flaring or accessory muscle use visualized.  Independent interpretation of tests performed by another provider:   None.  Brief History, Exam, Impression, and Recommendations:    Anxiety and depression Stacy Chung is touching base regarding grieving, her mother recently passed, she is doing okay, the services done, she is able to remain functional, we did refill her clonazepam. She is interested in going up on the Lexapro maybe for the next couple of months, happy to do this.  I have also asked her to advise me if and when she is ready for grief counseling. She can  return to see me at the beginning of 2023 for an annual physical.   I discussed the above assessment and treatment plan with the patient. The patient was provided an opportunity to ask questions and all were answered. The patient agreed with the plan and demonstrated an understanding of the instructions.   The patient was advised to call back or seek an in-person evaluation if the symptoms worsen or if the condition fails to improve as anticipated.   I provided 30 minutes of face to face and non-face-to-face time during this encounter date, time was needed to gather information, review chart, records, communicate/coordinate with staff remotely, as well as complete documentation.  Chronic process with exacerbation and pharmacologic intervention  ___________________________________________ Gwen Her. Dianah Field, M.D., ABFM., CAQSM. Primary Care and [place] Instructor of [place] of [facility] of Medicine

## 2021-03-15 NOTE — Telephone Encounter (Signed)
Advanced Heart Failure Patient Advocate Encounter  Prior Authorization for Eliquis has been approved.    PA# [id number] Effective dates: 01/29/21 through 02/28/22  Charlann Boxer, CPhT

## 2021-03-20 ENCOUNTER — Telehealth: Payer: Self-pay

## 2021-03-20 DIAGNOSIS — E039 Hypothyroidism, unspecified: Secondary | ICD-10-CM

## 2021-03-20 NOTE — Telephone Encounter (Signed)
-----   Message from Dema Severin, [place] sent at 02/05/2021 11:54 AM EDT ----- Regarding: recheck TSH 02/05/2021 - Per patient [place] email on 02/05/2021 TSH is still low at 0.08 at an outside facility on 125 mcg levothyroxine, decreasing to 112 mcg with a 6-week TSH recheck. Orders in.

## 2021-03-20 NOTE — Telephone Encounter (Signed)
Orders faxed to [facility] as requested. Patient can go at her convenience.

## 2021-03-20 NOTE — Telephone Encounter (Signed)
Done

## 2021-03-20 NOTE — Telephone Encounter (Signed)
Spoke with patient, she requested that the lab orders be placed through [facility] and faxed to the [place] location which is closer to her home. Please place order.

## 2021-04-01 ENCOUNTER — Other Ambulatory Visit: Payer: Self-pay | Admitting: Sports Medicine

## 2021-04-25 DIAGNOSIS — Z4502 Encounter for adjustment and management of automatic implantable cardiac defibrillator: Secondary | ICD-10-CM | POA: Diagnosis not present

## 2021-04-25 DIAGNOSIS — I255 Ischemic cardiomyopathy: Secondary | ICD-10-CM | POA: Diagnosis not present

## 2021-04-25 DIAGNOSIS — I43 Cardiomyopathy in diseases classified elsewhere: Secondary | ICD-10-CM | POA: Diagnosis not present

## 2021-04-25 DIAGNOSIS — I471 Supraventricular tachycardia: Secondary | ICD-10-CM | POA: Diagnosis not present

## 2021-05-09 ENCOUNTER — Telehealth (INDEPENDENT_AMBULATORY_CARE_PROVIDER_SITE_OTHER): Payer: BC Managed Care – PPO | Admitting: Sports Medicine

## 2021-05-09 ENCOUNTER — Other Ambulatory Visit: Payer: Self-pay | Admitting: Sports Medicine

## 2021-05-09 DIAGNOSIS — F32A Depression, unspecified: Secondary | ICD-10-CM | POA: Diagnosis not present

## 2021-05-09 DIAGNOSIS — I5021 Acute systolic (congestive) heart failure: Secondary | ICD-10-CM

## 2021-05-09 DIAGNOSIS — Z0000 Encounter for general adult medical examination without abnormal findings: Secondary | ICD-10-CM | POA: Diagnosis not present

## 2021-05-09 DIAGNOSIS — F419 Anxiety disorder, unspecified: Secondary | ICD-10-CM

## 2021-05-09 MED ORDER — BUPROPION HCL ER (XL) 150 MG PO TB24: 150.0000 mg | ORAL_TABLET | ORAL | 3 refills | Status: DC

## 2021-05-09 MED ORDER — ESCITALOPRAM OXALATE 5 MG PO TABS: 5.0000 mg | ORAL_TABLET | Freq: Every day | ORAL | 3 refills | Status: DC

## 2021-05-09 MED ORDER — SPIRONOLACTONE 50 MG PO TABS: 50.0000 mg | ORAL_TABLET | Freq: Every day | ORAL | 3 refills | Status: DC

## 2021-05-09 NOTE — Assessment & Plan Note (Signed)
Patient will make a nurse visit appointment for high-dose influenza, Prevnar 75 as we do not know her prior pneumonia vaccination status, as well as Td vaccine, she cannot have Tdap.

## 2021-05-09 NOTE — Progress Notes (Signed)
   Virtual Visit via Telephone   I connected with  Stacy Chung  on 05/09/21 by telephone/telehealth and verified that I am speaking with the correct person using two identifiers.   I discussed the limitations, risks, security and privacy concerns of performing an evaluation and management service by telephone, including the higher likelihood of inaccurate diagnosis and treatment, and the availability of in person appointments.  We also discussed the likely need of an additional face to face encounter for complete and high quality delivery of care.  I also discussed with the patient that there may be a patient responsible charge related to this service. The patient expressed understanding and wishes to proceed.  Provider location is in medical facility. Patient location is at their home, different from provider location. People involved in care of the patient during this telehealth encounter were myself, my nurse/medical assistant, and my front office/scheduling team member.  Review of Systems: No fevers, chills, night sweats, weight loss, chest pain, or shortness of breath.   Objective Findings:    General: Speaking full sentences, no audible heavy breathing.  Sounds alert and appropriately interactive.    Independent interpretation of tests performed by another provider:   None.  Brief History, Exam, Impression, and Recommendations:    Annual physical exam Patient will make a nurse visit appointment for high-dose influenza, Prevnar 13 as we do not know her prior pneumonia vaccination status, as well as Td vaccine, she cannot have Tdap.  Anxiety and depression Stacy Chung is doing okay, we had increased her Lexapro to 10 mg and she noted excessive drowsiness, she dropped back down to 5 and this continues to work well, she also like to restart Wellbutrin, I think this is all appropriate. She is still grieving with her mother's passing, but things are slowly improving.   I discussed the  above assessment and treatment plan with the patient. The patient was provided an opportunity to ask questions and all were answered. The patient agreed with the plan and demonstrated an understanding of the instructions.   The patient was advised to call back or seek an in-person evaluation if the symptoms worsen or if the condition fails to improve as anticipated.   I provided 30 minutes of verbal and non-verbal time during this encounter date, time was needed to gather information, review chart, records, communicate/coordinate with staff remotely, as well as complete documentation.   ___________________________________________ Gwen Her. Dianah Field, M.D., ABFM., CAQSM. Primary Care and Sports Medicine [facility]  Adjunct Professor of [place] [employer] [facility] [employer]

## 2021-05-09 NOTE — Assessment & Plan Note (Signed)
Stacy Chung is doing okay, we had increased her Lexapro to 10 mg and she noted excessive drowsiness, she dropped back down to 5 and this continues to work well, she also like to restart Wellbutrin, I think this is all appropriate. She is still grieving with her mother's passing, but things are slowly improving.

## 2021-05-10 DIAGNOSIS — E039 Hypothyroidism, unspecified: Secondary | ICD-10-CM | POA: Diagnosis not present

## 2021-05-11 LAB — T3, FREE: T3, Free: 2.8 pg/mL (ref 2.0–4.4)

## 2021-05-11 LAB — T4, FREE: Free T4: 1.48 ng/dL (ref 0.82–1.77)

## 2021-05-11 LAB — TSH: TSH: 0.046 u[IU]/mL — ABNORMAL LOW (ref 0.450–4.500)

## 2021-05-12 ENCOUNTER — Other Ambulatory Visit: Payer: Self-pay | Admitting: Sports Medicine

## 2021-05-12 DIAGNOSIS — E039 Hypothyroidism, unspecified: Secondary | ICD-10-CM

## 2021-05-12 MED ORDER — LEVOTHYROXINE SODIUM 100 MCG PO TABS: 100.0000 ug | ORAL_TABLET | Freq: Every day | ORAL | 11 refills | Status: DC

## 2021-05-12 NOTE — Assessment & Plan Note (Signed)
TSH is too low indicating overtreatment with levothyroxine, decreasing to 100 mcg, recheck in 6 weeks.

## 2021-05-13 DIAGNOSIS — E039 Hypothyroidism, unspecified: Secondary | ICD-10-CM

## 2021-05-14 MED ORDER — LEVOTHYROXINE SODIUM 100 MCG PO TABS: 100.0000 ug | ORAL_TABLET | Freq: Every day | ORAL | 3 refills | Status: DC

## 2021-05-27 DIAGNOSIS — H9201 Otalgia, right ear: Secondary | ICD-10-CM | POA: Diagnosis not present

## 2021-05-27 DIAGNOSIS — G43909 Migraine, unspecified, not intractable, without status migrainosus: Secondary | ICD-10-CM | POA: Diagnosis not present

## 2021-06-11 DIAGNOSIS — I5022 Chronic systolic (congestive) heart failure: Secondary | ICD-10-CM | POA: Diagnosis not present

## 2021-06-11 DIAGNOSIS — E785 Hyperlipidemia, unspecified: Secondary | ICD-10-CM | POA: Diagnosis not present

## 2021-06-11 DIAGNOSIS — I959 Hypotension, unspecified: Secondary | ICD-10-CM | POA: Diagnosis not present

## 2021-06-11 DIAGNOSIS — Z7901 Long term (current) use of anticoagulants: Secondary | ICD-10-CM | POA: Diagnosis not present

## 2021-06-11 DIAGNOSIS — R413 Other amnesia: Secondary | ICD-10-CM | POA: Diagnosis not present

## 2021-06-11 DIAGNOSIS — R5383 Other fatigue: Secondary | ICD-10-CM | POA: Diagnosis not present

## 2021-06-11 DIAGNOSIS — Z95810 Presence of automatic (implantable) cardiac defibrillator: Secondary | ICD-10-CM | POA: Diagnosis not present

## 2021-06-11 DIAGNOSIS — I428 Other cardiomyopathies: Secondary | ICD-10-CM | POA: Diagnosis not present

## 2021-06-11 DIAGNOSIS — Z86711 Personal history of pulmonary embolism: Secondary | ICD-10-CM | POA: Diagnosis not present

## 2021-06-11 DIAGNOSIS — G909 Disorder of the autonomic nervous system, unspecified: Secondary | ICD-10-CM | POA: Diagnosis not present

## 2021-06-11 DIAGNOSIS — Z923 Personal history of irradiation: Secondary | ICD-10-CM | POA: Diagnosis not present

## 2021-06-11 DIAGNOSIS — Z853 Personal history of malignant neoplasm of breast: Secondary | ICD-10-CM | POA: Diagnosis not present

## 2021-06-11 DIAGNOSIS — I447 Left bundle-branch block, unspecified: Secondary | ICD-10-CM | POA: Diagnosis not present

## 2021-06-11 DIAGNOSIS — Z23 Encounter for immunization: Secondary | ICD-10-CM | POA: Diagnosis not present

## 2021-06-11 DIAGNOSIS — R42 Dizziness and giddiness: Secondary | ICD-10-CM | POA: Diagnosis not present

## 2021-06-11 DIAGNOSIS — E039 Hypothyroidism, unspecified: Secondary | ICD-10-CM | POA: Diagnosis not present

## 2021-06-11 DIAGNOSIS — Z79899 Other long term (current) drug therapy: Secondary | ICD-10-CM | POA: Diagnosis not present

## 2021-06-11 DIAGNOSIS — R0689 Other abnormalities of breathing: Secondary | ICD-10-CM | POA: Diagnosis not present

## 2021-06-11 DIAGNOSIS — I513 Intracardiac thrombosis, not elsewhere classified: Secondary | ICD-10-CM | POA: Diagnosis not present

## 2021-06-12 DIAGNOSIS — L723 Sebaceous cyst: Secondary | ICD-10-CM | POA: Diagnosis not present

## 2021-06-12 DIAGNOSIS — Z85850 Personal history of malignant neoplasm of thyroid: Secondary | ICD-10-CM | POA: Diagnosis not present

## 2021-06-12 DIAGNOSIS — E039 Hypothyroidism, unspecified: Secondary | ICD-10-CM | POA: Diagnosis not present

## 2021-06-12 DIAGNOSIS — H66001 Acute suppurative otitis media without spontaneous rupture of ear drum, right ear: Secondary | ICD-10-CM | POA: Diagnosis not present

## 2021-07-12 DIAGNOSIS — I5020 Unspecified systolic (congestive) heart failure: Secondary | ICD-10-CM | POA: Diagnosis not present

## 2021-07-12 DIAGNOSIS — I5022 Chronic systolic (congestive) heart failure: Secondary | ICD-10-CM | POA: Diagnosis not present

## 2021-07-23 DIAGNOSIS — R5383 Other fatigue: Secondary | ICD-10-CM | POA: Diagnosis not present

## 2021-07-23 DIAGNOSIS — F0390 Unspecified dementia without behavioral disturbance: Secondary | ICD-10-CM | POA: Diagnosis not present

## 2021-07-23 DIAGNOSIS — I509 Heart failure, unspecified: Secondary | ICD-10-CM | POA: Diagnosis not present

## 2021-07-23 DIAGNOSIS — G909 Disorder of the autonomic nervous system, unspecified: Secondary | ICD-10-CM | POA: Diagnosis not present

## 2021-07-25 DIAGNOSIS — Z95810 Presence of automatic (implantable) cardiac defibrillator: Secondary | ICD-10-CM | POA: Diagnosis not present

## 2021-07-25 DIAGNOSIS — I43 Cardiomyopathy in diseases classified elsewhere: Secondary | ICD-10-CM | POA: Diagnosis not present

## 2021-07-26 DIAGNOSIS — Z4502 Encounter for adjustment and management of automatic implantable cardiac defibrillator: Secondary | ICD-10-CM | POA: Diagnosis not present

## 2021-07-26 DIAGNOSIS — I429 Cardiomyopathy, unspecified: Secondary | ICD-10-CM | POA: Diagnosis not present

## 2021-08-23 DIAGNOSIS — R0609 Other forms of dyspnea: Secondary | ICD-10-CM | POA: Diagnosis not present

## 2021-08-23 DIAGNOSIS — I5022 Chronic systolic (congestive) heart failure: Secondary | ICD-10-CM | POA: Diagnosis not present

## 2021-09-16 DIAGNOSIS — R739 Hyperglycemia, unspecified: Secondary | ICD-10-CM | POA: Diagnosis not present

## 2021-09-16 DIAGNOSIS — E890 Postprocedural hypothyroidism: Secondary | ICD-10-CM | POA: Diagnosis not present

## 2021-09-16 DIAGNOSIS — Z79890 Hormone replacement therapy (postmenopausal): Secondary | ICD-10-CM | POA: Diagnosis not present

## 2021-09-25 ENCOUNTER — Encounter: Payer: Self-pay | Admitting: Sports Medicine

## 2021-09-25 DIAGNOSIS — F32A Depression, unspecified: Secondary | ICD-10-CM

## 2021-09-26 MED ORDER — BUPROPION HCL ER (XL) 300 MG PO TB24: 300.0000 mg | ORAL_TABLET | ORAL | 3 refills | Status: DC

## 2021-09-26 NOTE — Assessment & Plan Note (Signed)
Stacy Chung having increasing anxiety symptoms, she could only tolerate 5 mg of Lexapro, we will bump her Wellbutrin up to 300 mg, if insufficient improvement after a good 6-week trial we will add Buspirone. ?She is still grieving with her mother's passing. ?

## 2021-10-24 DIAGNOSIS — Z95810 Presence of automatic (implantable) cardiac defibrillator: Secondary | ICD-10-CM | POA: Diagnosis not present

## 2021-10-24 DIAGNOSIS — Z4502 Encounter for adjustment and management of automatic implantable cardiac defibrillator: Secondary | ICD-10-CM | POA: Diagnosis not present

## 2021-10-24 DIAGNOSIS — I428 Other cardiomyopathies: Secondary | ICD-10-CM | POA: Diagnosis not present

## 2021-10-25 DIAGNOSIS — I428 Other cardiomyopathies: Secondary | ICD-10-CM | POA: Diagnosis not present

## 2021-10-25 DIAGNOSIS — Z4502 Encounter for adjustment and management of automatic implantable cardiac defibrillator: Secondary | ICD-10-CM | POA: Diagnosis not present

## 2021-10-27 DIAGNOSIS — Z4502 Encounter for adjustment and management of automatic implantable cardiac defibrillator: Secondary | ICD-10-CM | POA: Diagnosis not present

## 2021-10-27 DIAGNOSIS — I428 Other cardiomyopathies: Secondary | ICD-10-CM | POA: Diagnosis not present

## 2021-11-06 ENCOUNTER — Encounter: Payer: Self-pay | Admitting: Sports Medicine

## 2021-11-09 DIAGNOSIS — E039 Hypothyroidism, unspecified: Secondary | ICD-10-CM | POA: Diagnosis not present

## 2021-11-09 DIAGNOSIS — R41840 Attention and concentration deficit: Secondary | ICD-10-CM | POA: Diagnosis not present

## 2021-11-09 DIAGNOSIS — F32A Depression, unspecified: Secondary | ICD-10-CM | POA: Diagnosis not present

## 2021-11-09 DIAGNOSIS — G43909 Migraine, unspecified, not intractable, without status migrainosus: Secondary | ICD-10-CM | POA: Diagnosis not present

## 2021-11-10 DIAGNOSIS — F419 Anxiety disorder, unspecified: Secondary | ICD-10-CM | POA: Diagnosis not present

## 2021-11-10 DIAGNOSIS — E039 Hypothyroidism, unspecified: Secondary | ICD-10-CM | POA: Diagnosis not present

## 2021-11-10 DIAGNOSIS — F32A Depression, unspecified: Secondary | ICD-10-CM | POA: Diagnosis not present

## 2021-11-10 DIAGNOSIS — Z79899 Other long term (current) drug therapy: Secondary | ICD-10-CM | POA: Diagnosis not present

## 2021-11-10 DIAGNOSIS — G43909 Migraine, unspecified, not intractable, without status migrainosus: Secondary | ICD-10-CM | POA: Diagnosis not present

## 2021-11-12 DIAGNOSIS — Z79899 Other long term (current) drug therapy: Secondary | ICD-10-CM | POA: Diagnosis not present

## 2021-12-23 DIAGNOSIS — R739 Hyperglycemia, unspecified: Secondary | ICD-10-CM | POA: Diagnosis not present

## 2021-12-23 DIAGNOSIS — E890 Postprocedural hypothyroidism: Secondary | ICD-10-CM | POA: Diagnosis not present

## 2021-12-23 DIAGNOSIS — R439 Unspecified disturbances of smell and taste: Secondary | ICD-10-CM | POA: Diagnosis not present

## 2021-12-23 DIAGNOSIS — Z85850 Personal history of malignant neoplasm of thyroid: Secondary | ICD-10-CM | POA: Diagnosis not present

## 2021-12-23 DIAGNOSIS — R7303 Prediabetes: Secondary | ICD-10-CM | POA: Diagnosis not present

## 2021-12-30 ENCOUNTER — Encounter: Payer: Self-pay | Admitting: Sports Medicine

## 2022-01-09 ENCOUNTER — Ambulatory Visit (INDEPENDENT_AMBULATORY_CARE_PROVIDER_SITE_OTHER): Payer: BC Managed Care – PPO | Admitting: Sports Medicine

## 2022-01-09 DIAGNOSIS — Z170 Estrogen receptor positive status [ER+]: Secondary | ICD-10-CM

## 2022-01-09 DIAGNOSIS — G43809 Other migraine, not intractable, without status migrainosus: Secondary | ICD-10-CM

## 2022-01-09 DIAGNOSIS — C50511 Malignant neoplasm of lower-outer quadrant of right female breast: Secondary | ICD-10-CM | POA: Diagnosis not present

## 2022-01-09 DIAGNOSIS — F419 Anxiety disorder, unspecified: Secondary | ICD-10-CM

## 2022-01-09 DIAGNOSIS — I5021 Acute systolic (congestive) heart failure: Secondary | ICD-10-CM | POA: Diagnosis not present

## 2022-01-09 DIAGNOSIS — F32A Depression, unspecified: Secondary | ICD-10-CM

## 2022-01-09 DIAGNOSIS — M79645 Pain in left finger(s): Secondary | ICD-10-CM

## 2022-01-09 MED ORDER — METOPROLOL SUCCINATE ER 25 MG PO TB24: 12.5000 mg | ORAL_TABLET | Freq: Two times a day (BID) | ORAL | 3 refills | Status: DC

## 2022-01-09 MED ORDER — ANASTROZOLE 1 MG PO TABS: ORAL_TABLET | ORAL | 3 refills | Status: DC

## 2022-01-09 MED ORDER — ROSUVASTATIN CALCIUM 20 MG PO TABS: 20.0000 mg | ORAL_TABLET | Freq: Every day | ORAL | 3 refills | Status: AC

## 2022-01-09 MED ORDER — CLONAZEPAM 0.5 MG PO TABS: ORAL_TABLET | ORAL | 0 refills | Status: AC

## 2022-01-09 MED ORDER — BUPROPION HCL ER (XL) 300 MG PO TB24: 300.0000 mg | ORAL_TABLET | ORAL | 3 refills | Status: AC

## 2022-01-09 MED ORDER — UBRELVY 100 MG PO TABS: 1.0000 | ORAL_TABLET | ORAL | 11 refills | Status: AC | PRN

## 2022-01-09 MED ORDER — APIXABAN 5 MG PO TABS: 5.0000 mg | ORAL_TABLET | Freq: Two times a day (BID) | ORAL | 3 refills | Status: DC

## 2022-01-09 MED ORDER — DIGOXIN 125 MCG PO TABS
0.1250 mg | ORAL_TABLET | Freq: Every day | ORAL | 3 refills | Status: DC
Start: 2022-01-09 — End: 2022-04-03

## 2022-01-09 MED ORDER — SPIRONOLACTONE 50 MG PO TABS: 50.0000 mg | ORAL_TABLET | Freq: Every day | ORAL | 3 refills | Status: DC

## 2022-01-09 MED ORDER — ESOMEPRAZOLE MAGNESIUM 40 MG PO CPDR: DELAYED_RELEASE_CAPSULE | ORAL | 3 refills | Status: AC

## 2022-01-09 MED ORDER — ESCITALOPRAM OXALATE 5 MG PO TABS: 5.0000 mg | ORAL_TABLET | Freq: Every day | ORAL | 3 refills | Status: DC

## 2022-01-09 NOTE — Assessment & Plan Note (Signed)
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

## 2022-01-09 NOTE — Assessment & Plan Note (Signed)
Pain to left middle finger after picking up some plates, exam is completely normal, good motion, good strength. We can just watch this for now, no x-ray needed.

## 2022-01-09 NOTE — Assessment & Plan Note (Signed)
Would like a tilt table study, I am unable to order this and she will need to do this through her cardiologist.

## 2022-01-09 NOTE — Progress Notes (Signed)
    Procedures performed today:    None.  Independent interpretation of notes and tests performed by another provider:   None.  Brief History, Exam, Impression, and Recommendations:    Migraine Having increasing headaches when flying commercially, I do suspect this is likely related to a sinus problem, [facility] does request Roselyn Meier so I am happy to send it in.  Pain of left middle finger Pain to left middle finger after picking up some plates, exam is completely normal, good motion, good strength. We can just watch this for now, no x-ray needed.  Acute systolic heart failure with apical thrombus Would like a tilt table study, I am unable to order this and she will need to do this through her cardiologist.  I spent 40 minutes of total time managing this patient today, this includes chart review, face to face, and non-face to face time.  We spent some time talking about medical problems of other family members.  ____________________________________________ Gwen Her. Dianah Field, M.D., ABFM., CAQSM., AME. Primary Care and Sports Medicine [facility]  Adjunct Professor of [place] [employer] [facility] of Medicine  Risk manager

## 2022-01-24 DIAGNOSIS — I43 Cardiomyopathy in diseases classified elsewhere: Secondary | ICD-10-CM | POA: Diagnosis not present

## 2022-01-24 DIAGNOSIS — Z4502 Encounter for adjustment and management of automatic implantable cardiac defibrillator: Secondary | ICD-10-CM | POA: Diagnosis not present

## 2022-01-28 DIAGNOSIS — Z4502 Encounter for adjustment and management of automatic implantable cardiac defibrillator: Secondary | ICD-10-CM | POA: Diagnosis not present

## 2022-01-29 DIAGNOSIS — Z6825 Body mass index (BMI) 25.0-25.9, adult: Secondary | ICD-10-CM | POA: Diagnosis not present

## 2022-01-29 DIAGNOSIS — F0390 Unspecified dementia without behavioral disturbance: Secondary | ICD-10-CM | POA: Diagnosis not present

## 2022-01-29 DIAGNOSIS — G43909 Migraine, unspecified, not intractable, without status migrainosus: Secondary | ICD-10-CM | POA: Diagnosis not present

## 2022-01-29 DIAGNOSIS — I5040 Unspecified combined systolic (congestive) and diastolic (congestive) heart failure: Secondary | ICD-10-CM | POA: Diagnosis not present

## 2022-01-30 ENCOUNTER — Encounter: Payer: Self-pay | Admitting: Sports Medicine

## 2022-02-11 DIAGNOSIS — I5040 Unspecified combined systolic (congestive) and diastolic (congestive) heart failure: Secondary | ICD-10-CM | POA: Diagnosis not present

## 2022-02-13 DIAGNOSIS — Z6825 Body mass index (BMI) 25.0-25.9, adult: Secondary | ICD-10-CM | POA: Diagnosis not present

## 2022-02-13 DIAGNOSIS — J069 Acute upper respiratory infection, unspecified: Secondary | ICD-10-CM | POA: Diagnosis not present

## 2022-02-13 DIAGNOSIS — J029 Acute pharyngitis, unspecified: Secondary | ICD-10-CM | POA: Diagnosis not present

## 2022-02-17 DIAGNOSIS — I429 Cardiomyopathy, unspecified: Secondary | ICD-10-CM | POA: Diagnosis not present

## 2022-02-17 DIAGNOSIS — I420 Dilated cardiomyopathy: Secondary | ICD-10-CM | POA: Diagnosis not present

## 2022-02-17 DIAGNOSIS — R0600 Dyspnea, unspecified: Secondary | ICD-10-CM | POA: Diagnosis not present

## 2022-02-17 DIAGNOSIS — I509 Heart failure, unspecified: Secondary | ICD-10-CM | POA: Diagnosis not present

## 2022-02-18 DIAGNOSIS — R5382 Chronic fatigue, unspecified: Secondary | ICD-10-CM | POA: Diagnosis not present

## 2022-02-18 DIAGNOSIS — G909 Disorder of the autonomic nervous system, unspecified: Secondary | ICD-10-CM | POA: Diagnosis not present

## 2022-02-18 DIAGNOSIS — I420 Dilated cardiomyopathy: Secondary | ICD-10-CM | POA: Diagnosis not present

## 2022-02-18 DIAGNOSIS — F411 Generalized anxiety disorder: Secondary | ICD-10-CM | POA: Diagnosis not present

## 2022-02-18 DIAGNOSIS — E039 Hypothyroidism, unspecified: Secondary | ICD-10-CM | POA: Diagnosis not present

## 2022-02-18 DIAGNOSIS — R41844 Frontal lobe and executive function deficit: Secondary | ICD-10-CM | POA: Diagnosis not present

## 2022-02-18 DIAGNOSIS — G43909 Migraine, unspecified, not intractable, without status migrainosus: Secondary | ICD-10-CM | POA: Diagnosis not present

## 2022-02-26 DIAGNOSIS — E785 Hyperlipidemia, unspecified: Secondary | ICD-10-CM | POA: Diagnosis not present

## 2022-02-26 DIAGNOSIS — I427 Cardiomyopathy due to drug and external agent: Secondary | ICD-10-CM | POA: Diagnosis not present

## 2022-02-26 DIAGNOSIS — G909 Disorder of the autonomic nervous system, unspecified: Secondary | ICD-10-CM | POA: Diagnosis not present

## 2022-02-26 DIAGNOSIS — I509 Heart failure, unspecified: Secondary | ICD-10-CM | POA: Diagnosis not present

## 2022-03-12 ENCOUNTER — Other Ambulatory Visit: Payer: Self-pay

## 2022-03-12 ENCOUNTER — Emergency Department (HOSPITAL_BASED_OUTPATIENT_CLINIC_OR_DEPARTMENT_OTHER)
Admission: EM | Admit: 2022-03-12 | Discharge: 2022-03-12 | Disposition: A | Discharge status: Home / Self Care | Payer: BC Managed Care – PPO | Attending: Emergency Medicine | Admitting: Emergency Medicine

## 2022-03-12 ENCOUNTER — Encounter (HOSPITAL_BASED_OUTPATIENT_CLINIC_OR_DEPARTMENT_OTHER): Payer: Self-pay | Admitting: Emergency Medicine

## 2022-03-12 DIAGNOSIS — T5491XA Toxic effect of unspecified corrosive substance, accidental (unintentional), initial encounter: Secondary | ICD-10-CM | POA: Insufficient documentation

## 2022-03-12 DIAGNOSIS — Z7901 Long term (current) use of anticoagulants: Secondary | ICD-10-CM | POA: Insufficient documentation

## 2022-03-12 MED ORDER — ALUM & MAG HYDROXIDE-SIMETH 200-200-20 MG/5ML PO SUSP
30.0000 mL | Freq: Once | ORAL | Status: AC
  Administered 2022-03-12: 30 mL via ORAL
  Filled 2022-03-12: qty 30

## 2022-03-12 NOTE — ED Triage Notes (Signed)
Patient arrived via POV c/o possible chemical injestion at [place] x 92mn pta. Patient states getting second rasberry tea, taking sip and tasting chemical flavor. Patient states spitting drink out, swishing mouth several times with water, still has chemical tingling/burn in mouth. Patient is AO x 4, VS  w/ elevated HR, normal gait.

## 2022-03-12 NOTE — ED Provider Notes (Signed)
[place] [facility] Provider Note   CSN: [id number] Arrival date & time: 03/12/22  2038     History  Chief Complaint  Patient presents with   possible poisoning    Stacy Chung is a 51 y.o. female.  51 yo F with a chief complaints of possibly ingesting something at [employer].  She tells me that she was drinking a raspberry tea and she did finish her tea and asked for refill and then when she drank a sip of the next drink she felt a burning sensation to her mouth and down her throat.  She has felt continued burning especially when she belches.  She denies any difficulty swallowing denies any difficulty breathing.  She brought the glass of tea with her here.  She spoke with the manager at the restaurant who told her he thought it was not raspberry but mango.        Home Medications Prior to Admission medications   Medication Sig Start Date End Date Taking? Authorizing Provider  AMBULATORY NON FORMULARY MEDICATION Myofascial release therapy and massage therapy 2-3 times a week as needed 09/22/17   [name], MD  anastrozole (ARIMIDEX) 1 MG tablet TAKE 1 TABLET(1 MG) BY MOUTH DAILY 01/09/22   [name], MD  apixaban (ELIQUIS) 5 MG TABS tablet Take 1 tablet (5 mg total) by mouth 2 (two) times daily. 01/09/22   [name], MD  buPROPion (WELLBUTRIN XL) 300 MG 24 hr tablet Take 1 tablet (300 mg total) by mouth every morning. 01/09/22   [name], MD  CALCIUM PO Take 1,200 mg by mouth daily.     [provider]  Cholecalciferol (VITAMIN D3) 10000 UNITS capsule Take 10,000 Units by mouth daily. Only 5 days a week    [provider]  clonazePAM (KLONOPIN) 0.5 MG tablet TAKE 1 TABLET(0.5 MG) BY MOUTH TWICE DAILY FOR ANXIETY 01/09/22   [name], MD  digoxin (LANOXIN) 0.125 MG tablet Take 1 tablet (0.125 mg total) by mouth daily. 01/09/22   [name], MD  escitalopram (LEXAPRO)  5 MG tablet Take 1 tablet (5 mg total) by mouth daily. 01/09/22   [name], MD  esomeprazole (NEXIUM) 40 MG capsule TAKE 1 CAPSULE DAILY AT 12 NOON 01/09/22   [name], MD  metoprolol succinate (TOPROL-XL) 25 MG 24 hr tablet Take 0.5 tablets (12.5 mg total) by mouth in the morning and at bedtime. 01/09/22 01/09/23  [name], MD  ondansetron (ZOFRAN-ODT) 8 MG disintegrating tablet Take 1 tablet (8 mg total) by mouth every 8 (eight) hours as needed for nausea. 04/20/20   [name], MD  Prasterone (INTRAROSA) 6.5 MG INST Place 1 application vaginally daily. 04/20/20   [name], MD  rosuvastatin (CRESTOR) 20 MG tablet Take 1 tablet (20 mg total) by mouth at bedtime. 01/09/22   [name], MD  spironolactone (ALDACTONE) 50 MG tablet Take 1 tablet (50 mg total) by mouth daily. 01/09/22   [name], MD  SYNTHROID 112 MCG tablet Take 112 mcg by mouth daily. 01/01/22   [provider]  Ubrogepant (UBRELVY) 100 MG TABS Take 1 tablet by mouth as needed. 01/09/22   [name], MD  zolmitriptan (ZOMIG) 5 MG tablet Take 1 tablet (5 mg total) by mouth as needed. 04/20/20   [name], MD      Allergies    Latex, Other, Tamoxifen, Macrodantin, Pertussis vaccines,  and Tape    Review of Systems   Review of Systems  Physical Exam Updated Vital Signs BP 116/70 (BP Location: Left Arm)   Pulse 92   Temp 97.9 F (36.6 C) (Oral)   Resp 18   Ht '5\' 5"'$  (1.651 m)   Wt 73.9 kg   LMP 02/29/2016   SpO2 99%   BMI 27.12 kg/m  Physical Exam Vitals and nursing note reviewed.  Constitutional:      General: She is not in acute distress.    Appearance: She is well-developed. She is not diaphoretic.  HENT:     Head: Normocephalic and atraumatic.     Mouth/Throat:     Comments: Posterior oropharyngeal erythema without appreciable edema no appreciable blistering.  Tolerate secretions without  difficulty. Eyes:     Pupils: Pupils are equal, round, and reactive to light.  Cardiovascular:     Rate and Rhythm: Normal rate and regular rhythm.     Heart sounds: No murmur heard.    No friction rub. No gallop.  Pulmonary:     Effort: Pulmonary effort is normal.     Breath sounds: No wheezing or rales.  Abdominal:     General: There is no distension.     Palpations: Abdomen is soft.     Tenderness: There is no abdominal tenderness.  Musculoskeletal:        General: No tenderness.     Cervical back: Normal range of motion and neck supple.  Skin:    General: Skin is warm and dry.  Neurological:     Mental Status: She is alert and oriented to person, place, and time.  Psychiatric:        Behavior: Behavior normal.     ED Results / Procedures / Treatments   Labs (all labs ordered are listed, but only abnormal results are displayed) Labs Reviewed - No data to display  EKG None  Radiology No results found.  Procedures Procedures    Medications Ordered in ED Medications  alum & mag hydroxide-simeth (MAALOX/MYLANTA) 200-200-20 MG/5ML suspension 30 mL (30 mLs Oral Given 03/12/22 2212)    ED Course/ Medical Decision Making/ A&P                           Medical Decision Making Risk OTC drugs.   51 yo F with a chief complaints of possible ingestion of a toxic substance.  She tells me that she took a sip of a drink and felt it burned all the way down.  The way she is describing at the most concerning thing I think would be a caustic ingestion.  I suspect that this was not industrial grade bleach.  There is no obvious blistering on exam.  She is phonating well she is having no difficulty breathing.  I offered further observation in the emergency department.  She plans to observe at home.  PCP follow-up.  11:37 PM:  I have discussed the diagnosis/risks/treatment options with the patient and family.  Evaluation and diagnostic testing in the emergency department does not  suggest an emergent condition requiring admission or immediate intervention beyond what has been performed at this time.  They will follow up with  PCP. We also discussed returning to the ED immediately if new or worsening sx occur. We discussed the sx which are most concerning (e.g., sudden worsening pain, fever, inability to tolerate by mouth) that necessitate immediate return. Medications administered to the patient during their  visit and any new prescriptions provided to the patient are listed below.  Medications given during this visit Medications  alum & mag hydroxide-simeth (MAALOX/MYLANTA) 200-200-20 MG/5ML suspension 30 mL (30 mLs Oral Given 03/12/22 2212)     The patient appears reasonably screen and/or stabilized for discharge and I doubt any other medical condition or other [facility] requiring further screening, evaluation, or treatment in the ED at this time prior to discharge.          Final Clinical Impression(s) / ED Diagnoses Final diagnoses:  Ingestion of corrosive chemical, accidental or unintentional, initial encounter    Rx / DC Orders ED Discharge Orders     None         Deno Etienne, DO 03/12/22 2337

## 2022-03-12 NOTE — Discharge Instructions (Signed)
Return for worsening sore throat, difficulty swallowing.

## 2022-04-03 ENCOUNTER — Encounter (HOSPITAL_COMMUNITY): Payer: Self-pay | Admitting: Internal Medicine

## 2022-04-03 ENCOUNTER — Ambulatory Visit (HOSPITAL_COMMUNITY)
Admission: RE | Admit: 2022-04-03 | Discharge: 2022-04-03 | Disposition: A | Discharge status: Home / Self Care | Payer: BC Managed Care – PPO | Source: Ambulatory Visit | Attending: Internal Medicine | Admitting: Internal Medicine

## 2022-04-03 VITALS — BP 108/74 | HR 75 | Wt 167.8 lb

## 2022-04-03 DIAGNOSIS — C50911 Malignant neoplasm of unspecified site of right female breast: Secondary | ICD-10-CM | POA: Insufficient documentation

## 2022-04-03 DIAGNOSIS — I5021 Acute systolic (congestive) heart failure: Secondary | ICD-10-CM

## 2022-04-03 DIAGNOSIS — C73 Malignant neoplasm of thyroid gland: Secondary | ICD-10-CM | POA: Diagnosis not present

## 2022-04-03 DIAGNOSIS — J9383 Other pneumothorax: Secondary | ICD-10-CM | POA: Insufficient documentation

## 2022-04-03 DIAGNOSIS — C8190 Hodgkin lymphoma, unspecified, unspecified site: Secondary | ICD-10-CM | POA: Insufficient documentation

## 2022-04-03 DIAGNOSIS — Z79811 Long term (current) use of aromatase inhibitors: Secondary | ICD-10-CM | POA: Diagnosis not present

## 2022-04-03 DIAGNOSIS — F32A Depression, unspecified: Secondary | ICD-10-CM | POA: Insufficient documentation

## 2022-04-03 DIAGNOSIS — Z79899 Other long term (current) drug therapy: Secondary | ICD-10-CM | POA: Diagnosis not present

## 2022-04-03 DIAGNOSIS — R232 Flushing: Secondary | ICD-10-CM | POA: Insufficient documentation

## 2022-04-03 DIAGNOSIS — I513 Intracardiac thrombosis, not elsewhere classified: Secondary | ICD-10-CM | POA: Diagnosis not present

## 2022-04-03 DIAGNOSIS — R5383 Other fatigue: Secondary | ICD-10-CM | POA: Insufficient documentation

## 2022-04-03 DIAGNOSIS — Z9013 Acquired absence of bilateral breasts and nipples: Secondary | ICD-10-CM | POA: Insufficient documentation

## 2022-04-03 DIAGNOSIS — I447 Left bundle-branch block, unspecified: Secondary | ICD-10-CM | POA: Diagnosis not present

## 2022-04-03 DIAGNOSIS — Z85820 Personal history of malignant melanoma of skin: Secondary | ICD-10-CM | POA: Insufficient documentation

## 2022-04-03 DIAGNOSIS — I5022 Chronic systolic (congestive) heart failure: Secondary | ICD-10-CM | POA: Insufficient documentation

## 2022-04-03 DIAGNOSIS — I950 Idiopathic hypotension: Secondary | ICD-10-CM

## 2022-04-03 DIAGNOSIS — I959 Hypotension, unspecified: Secondary | ICD-10-CM | POA: Insufficient documentation

## 2022-04-03 DIAGNOSIS — Z853 Personal history of malignant neoplasm of breast: Secondary | ICD-10-CM | POA: Diagnosis not present

## 2022-04-03 DIAGNOSIS — Z800 Family history of malignant neoplasm of digestive organs: Secondary | ICD-10-CM | POA: Diagnosis not present

## 2022-04-03 MED ORDER — MIDODRINE HCL 2.5 MG PO TABS: 2.5000 mg | ORAL_TABLET | Freq: Three times a day (TID) | ORAL | 6 refills | Status: DC

## 2022-04-03 MED ORDER — SPIRONOLACTONE 25 MG PO TABS: 12.5000 mg | ORAL_TABLET | Freq: Every day | ORAL | 6 refills | Status: DC

## 2022-04-03 MED ORDER — MIDODRINE HCL 2.5 MG PO TABS: 2.5000 mg | ORAL_TABLET | Freq: Two times a day (BID) | ORAL | 6 refills | Status: DC

## 2022-04-03 NOTE — Patient Instructions (Signed)
Stop Digoxin  Stop Metoprolol  Decrease Spironolactone to 12.5 mg (1/2 tab) Daily  Start Midodrine 2.5 mg Twice daily   Your physician recommends that you schedule a follow-up appointment in: 6 months (March 2024), **PLEASE CALL OUR OFFICE IN [place] TO SCHEDULE THIS APPOINTMENT  If you have any questions or concerns before your next appointment please send [place] a message through [place] or call our office at [phone].    TO LEAVE A MESSAGE FOR THE NURSE SELECT OPTION 2, PLEASE LEAVE A MESSAGE INCLUDING: YOUR NAME DATE OF BIRTH CALL BACK NUMBER REASON FOR CALL**this is important as we prioritize the call backs  YOU WILL RECEIVE A CALL BACK THE SAME DAY AS LONG AS YOU CALL BEFORE 4:00 PM  At the [facility], you and your health needs are our priority. As part of our continuing mission to provide you with exceptional heart care, we have created designated Provider Care Teams. These Care Teams include your primary Cardiologist (physician) and Advanced Practice Providers (APPs- Physician Assistants and Nurse Practitioners) who all work together to provide you with the care you need, when you need it.   You may see any of the following providers on your designated Care Team at your next follow up: Dr Glori Bickers Dr Loralie Champagne Dr. Roxana Hires, NP Lyda Jester, [place] [facility] [place], [place] Forestine Na, NP Audry Riles, PharmD   Please be sure to bring in all your medications bottles to every appointment.

## 2022-04-03 NOTE — Progress Notes (Signed)
ADVANCED HF CLINIC CONSULT NOTE  Referring Physician: Dr. Aundria Mems Primary Care: Dr. 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 Initially treated with tamoxifen but stopped due to PE. Letrozole 2.5 mg daily switched to anastrozole 03/23/2017 due to hot flashes and myalgias  She has had multiple PTX and underwent previous left thoracotomy for bleb resection in 1991. Underwent chest tube placement in 6/19 for recurrent spontaneous left PTX.   In October 2017 had new LBBB. Myoview at that time EF 59%.   Echo on 03/18/19 showed mildly dilated LV (5.6cm) EF 20-25% with diffuse HK with apical thrombus, severe MR and mild RV dysfunction.   We saw her for one visit on 03/22/19 and she transferred her care to [facility] and [facility]. Cath in [facility] in 9/20. RCA 30%. Has been followed by Dr. Alric Ran.  cMRI 04/2019 which showed depressed LVEF 25% w/o LGE. Had CRT-D 06/23/19 w/ adequate BIV pacing. GDMT has been limited by low BP and extreme fatigue. Felt to have autonomic dysfunction/POTS. Has had issues w/ memory loss and seen by neurology - Not felt to have cognitive disorder  Echo 7/22 EF 55-60%.   CPET 2/23 @ [place]: Only went 4 mins. Baseline heart rate was 89 bpm.  Peak heart rate was 102 bpm (60% MPHR). Baseline blood pressure was 110/62 mmHg.  Peak BP was 122/64 mmHg.  pVO2 7.2 (34%) RER and VEVCO2 slope not reported  Presents for a second opinion and too re-establish care. Says she has never really gotten better despite improvement in her EF.  Says BP is very labile. Checks BP several times per day - usually 100/60. Says she ahs daily "episodes" where she feels bad - dizzy, weak in the knees, mild SOB. Sits down on the floor and gets some better. No syncope. Occasional palpitations. + occasional upper body edema. Most recently saw Dr. Kimber Relic at [facility] and tilt table test ordered pending. Also has referral to autonomic dysfunction clinic at [facility]. Occasional flushing but not severe.     Review of Systems: [y] = yes, [ ]  = no   General: Weight gain [ ] ; Weight loss [ ] ; Anorexia [ ] ; Fatigue [ y]; Fever [ ] ; Chills [ ] ; Weakness [ ]   Cardiac: Chest pain/pressure [ ] ; Resting SOB [ ] ; Exertional SOB Blue.Reese ]; Orthopnea Blue.Reese ]; Pedal Edema [ ] ; Palpitations [ ] ; Syncope [ ] ; Presyncope [ ] ; Paroxysmal nocturnal dyspnea[ ]   Pulmonary: Cough [ ] ; Wheezing[ ] ; Hemoptysis[ ] ; Sputum [ ] ; Snoring [ ]   GI: Vomiting[ ] ; Dysphagia[ ] ; Melena[ ] ; Hematochezia [ ] ; Heartburn[ ] ; Abdominal pain [ ] ; Constipation [ ] ; Diarrhea [ ] ; BRBPR [ ]   GU: Hematuria[ ] ; Dysuria [ ] ; Nocturia[ ]   Vascular: Pain in legs with walking [ ] ; Pain in feet with lying flat [ ] ; Non-healing sores [ ] ; Stroke [ ] ; TIA [ ] ; Slurred speech [ ] ;  Neuro: Headaches[ ] ; Vertigo[ ] ; Seizures[ ] ; Paresthesias[ ] ;Blurred vision [ ] ; Diplopia [ ] ; Vision changes [ ]   Ortho/Skin: Arthritis [ ] ; Joint pain [ ] ; Muscle pain [ ] ; Joint swelling [ ] ;  Back Pain [ ] ; Rash [ ]   Psych: Depression[ ] ; Anxiety[y ]  Heme: Bleeding problems [ ] ; Clotting disorders [ ] ; Anemia [ ]   Endocrine: Diabetes [ ] ; Thyroid dysfunction[ y]   Past Medical History:  Diagnosis Date   Allergy    Anxiety    Breast cancer ([place])    Cancer ([place]) 2003   papillary thyroid cancer   Cancer ([place]) 1986   Hodgkins lymphoma   Carcinoma of thyroid ([place])    CHF (congestive heart failure) ([place])    Family history of breast cancer    Family history of colon cancer    Family history of prostate cancer    GERD  (gastroesophageal reflux disease)    History of chicken pox    Hodgkin's disease    Hx: UTI (urinary tract infection)    Increased frequency of headaches    Left breast mass    Migraines    PONV (postoperative nausea and vomiting)    Primary spontaneous pneumothorax 1990   left   Pulmonary embolism ([place]) 11/30/2015   Recurrent spontaneous pneumothorax 1991   left   Skin cancer 2003   Melanoma   Thyroid disease     Current Outpatient Medications  Medication Sig Dispense Refill   AMBULATORY NON FORMULARY MEDICATION Myofascial release therapy and massage therapy 2-3 times a week as needed 1 each 0   anastrozole (ARIMIDEX) 1 MG tablet TAKE 1 TABLET(1 MG) BY MOUTH DAILY 90 tablet 3   apixaban (ELIQUIS) 5 MG TABS tablet Take 1 tablet (5 mg total) by mouth 2 (two) times daily. 180 tablet 3   buPROPion (WELLBUTRIN XL) 300 MG 24 hr tablet Take 1 tablet (300 mg total) by mouth every morning. 90 tablet 3   CALCIUM PO Take 1,200 mg by mouth daily.      Cholecalciferol (VITAMIN D3) 10000 UNITS capsule Take 10,000 Units by mouth daily. Only 5 days a week     clonazePAM (KLONOPIN) 0.5 MG tablet TAKE 1 TABLET(0.5 MG) BY MOUTH TWICE DAILY FOR ANXIETY 180 tablet 0   escitalopram (LEXAPRO) 5 MG tablet Take 1 tablet (5 mg total) by mouth daily. 90 tablet 3   esomeprazole (NEXIUM) 40 MG capsule TAKE 1 CAPSULE DAILY AT 12 NOON 90 capsule 3   furosemide (LASIX) 40 MG tablet Take 40 mg by mouth as needed.     ondansetron (ZOFRAN-ODT) 8 MG disintegrating tablet Take 1 tablet (8 mg total) by mouth every 8 (eight) hours as needed for nausea. 60 tablet 3   potassium chloride SA (KLOR-CON M) 20 MEQ tablet Take 20 mEq by mouth as needed.     Prasterone (INTRAROSA) 6.5 MG INST Place 1 application vaginally daily. 90 each 3   rosuvastatin (CRESTOR) 20 MG tablet Take 1 tablet (20 mg total) by mouth at bedtime. 90 tablet 3   spironolactone (ALDACTONE) 50 MG tablet Take 1 tablet (50 mg total) by mouth daily. 90  tablet 3   SYNTHROID 112 MCG tablet Take 112 mcg by mouth daily.     Ubrogepant (UBRELVY) 100 MG TABS Take 1 tablet by mouth as needed. 30 tablet 11   No current facility-administered medications for this encounter.    Allergies  Allergen Reactions   Latex Itching    Severe pruritis  Band aids/tapes   Other Rash    "bandaides"   Tamoxifen Other (See Comments)    Other reaction(s): Other: See Comments Pulmonary emboli Blood clots in lungs    Macrodantin  Pertussis Vaccines    Tape Rash    "plastic tape"      Social History   Socioeconomic History   Marital status: Married    Spouse name: Not on file   Number of children: 2   Years of education: 16   Highest education level: Not on file  Occupational History   Occupation: Chief Strategy Officer  Tobacco Use   Smoking status: Never   Smokeless tobacco: Never  Vaping Use   Vaping Use: Never used  Substance and Sexual Activity   Alcohol use: Not Currently   Drug use: No   Sexual activity: Yes    Birth control/protection: None  Other Topics Concern   Not on file  Social History Narrative   Regular exercise-no   Social Determinants of Health   Financial Resource Strain: Not on file  Food Insecurity: Not on file  Transportation Needs: Not on file  Physical Activity: Not on file  Stress: Not on file  Social Connections: Not on file  Intimate Partner Violence: Not on file      Family History  Problem Relation Age of Onset   Prostate cancer Maternal Uncle 55   Colon cancer Maternal Grandmother        dx in her 68s   Prostate cancer Maternal Grandfather        dx in his 57s   Cancer Other        FH of Breast Cancer, Ovarian/Uterine Cancer   Heart disease Maternal Uncle        36s   Breast cancer Other        MGFs sister - reportedly BRCA+   Breast cancer Other        MGMs sister   Breast cancer Other        MGFs mother    Vitals:   04/03/22 0934 04/03/22 1022  BP: (!) 80/50 108/74  Pulse: 75    SpO2: 97%   Weight: 76.1 kg (167 lb 12.8 oz)      PHYSICAL EXAM: General:  Weak appearing. No respiratory difficulty HEENT: normal Neck: supple. no JVD. Carotids 2+ bilat; no bruits. No lymphadenopathy or thryomegaly appreciated. Cor: PMI nondisplaced. Regular rate & rhythm. Soft MR +s3 wise split s2 Lungs: clear Abdomen: soft, nontender, nondistended. No hepatosplenomegaly. No bruits or masses. Good bowel sounds. Extremities: no cyanosis, clubbing, rash, edema Neuro: alert & oriented x 3, cranial nerves grossly intact. moves all 4 extremities w/o difficulty. Affect pleasant.   ASSESSMENT & PLAN:  1. Chonic systolic HF with recovered EF - echo 03/18/19 EF 20-35% with diffuse HK with apical thrombus, severe MR and mild RV dysfunction.  - suspect due to adriamycin toxicity but may also be related to XRT or ischemia. LBBB less likely with QRS only 147m [facility]  - cMRI 04/2019 which showed depressed LVEF 25% w/o LGE. S/p BosSci CRT-D 06/23/19 w/ adequate BIV pacing.  - GDMT has been limited by low BP and extreme fatigue. Felt to have autonomic dysfunction/POTS. - Echo 7/22 EF 55-60%.  - EF has recovered with CRT suspect LBBB CM - HeartLogic Score = 8 Activity level 1.2. Night HR 70 Mean HR 80   - With normal EF and low BP I think we can stop digoxin, stop metoprolol and cut spiro to 12.5 daily - Did discuss slight risk of worsening EF with pulling back GDMT but I think we have no choice - Repeat echo 6 months  2.  Possible autonomic dysfunction/POTS and hypotension - suspect possible autonomic dysfunction due to chemo. Given lack of tachycardia doubt POTS - await tilt table results - adjust HF meds as above - start midodrine 2.5 bid (hold x 2 days before tilt table) - Wear compression stockings  3. LV thombus - resolved  4. H/o multiple cancers - s/p ABVD-MOPP treatment in 1980  5. H/o recurrent spontaneous left PTX  - s/p previous bleb  resection on left.    Glori Bickers, MD  10:36 AM

## 2022-04-15 DIAGNOSIS — I951 Orthostatic hypotension: Secondary | ICD-10-CM | POA: Diagnosis not present

## 2022-04-21 DIAGNOSIS — I951 Orthostatic hypotension: Secondary | ICD-10-CM | POA: Diagnosis not present

## 2022-04-21 DIAGNOSIS — R9439 Abnormal result of other cardiovascular function study: Secondary | ICD-10-CM | POA: Diagnosis not present

## 2022-04-25 DIAGNOSIS — Z45010 Encounter for checking and testing of cardiac pacemaker pulse generator [battery]: Secondary | ICD-10-CM | POA: Diagnosis not present

## 2022-04-25 DIAGNOSIS — I5020 Unspecified systolic (congestive) heart failure: Secondary | ICD-10-CM | POA: Diagnosis not present

## 2022-04-29 DIAGNOSIS — Z4502 Encounter for adjustment and management of automatic implantable cardiac defibrillator: Secondary | ICD-10-CM | POA: Diagnosis not present

## 2022-05-02 ENCOUNTER — Telehealth (HOSPITAL_COMMUNITY): Payer: Self-pay

## 2022-05-02 NOTE — Telephone Encounter (Signed)
Patient called stating that she had a tilt table test done recently and since then she has felt different. She reports that she has been experiencing occasional shortness of breath, increased heart rate(resting has been 97-103) standing/walking HR (140-160). Patient reports all of her Blood pressures have been ok except for 1 that was elevated (couldn't remember what it was). Patient also reports that she has not started the Midodrine yet. Please advise.

## 2022-05-02 NOTE — Telephone Encounter (Signed)
She said she wasn't a candidate for the [facility]. But she has no other cardiology appointments until January.

## 2022-05-21 DIAGNOSIS — C50919 Malignant neoplasm of unspecified site of unspecified female breast: Secondary | ICD-10-CM | POA: Diagnosis not present

## 2022-05-21 DIAGNOSIS — E039 Hypothyroidism, unspecified: Secondary | ICD-10-CM | POA: Diagnosis not present

## 2022-05-21 DIAGNOSIS — E7800 Pure hypercholesterolemia, unspecified: Secondary | ICD-10-CM | POA: Diagnosis not present

## 2022-05-21 DIAGNOSIS — Z79899 Other long term (current) drug therapy: Secondary | ICD-10-CM | POA: Diagnosis not present

## 2022-05-21 DIAGNOSIS — E559 Vitamin D deficiency, unspecified: Secondary | ICD-10-CM | POA: Diagnosis not present

## 2022-05-21 DIAGNOSIS — I429 Cardiomyopathy, unspecified: Secondary | ICD-10-CM | POA: Diagnosis not present

## 2022-05-21 DIAGNOSIS — C73 Malignant neoplasm of thyroid gland: Secondary | ICD-10-CM | POA: Diagnosis not present

## 2022-05-21 DIAGNOSIS — Z23 Encounter for immunization: Secondary | ICD-10-CM | POA: Diagnosis not present

## 2022-05-21 DIAGNOSIS — I509 Heart failure, unspecified: Secondary | ICD-10-CM | POA: Diagnosis not present

## 2022-05-21 DIAGNOSIS — R7303 Prediabetes: Secondary | ICD-10-CM | POA: Diagnosis not present

## 2022-08-12 ENCOUNTER — Other Ambulatory Visit (HOSPITAL_COMMUNITY): Payer: Self-pay

## 2022-08-12 DIAGNOSIS — I5021 Acute systolic (congestive) heart failure: Secondary | ICD-10-CM

## 2022-08-12 MED ORDER — SPIRONOLACTONE 25 MG PO TABS: 12.5000 mg | ORAL_TABLET | Freq: Every day | ORAL | 0 refills | Status: DC

## 2023-01-14 IMAGING — DX DG CHEST 2V
2 series · 2 of 2 positions shown · non-contrast
Comparison: Chest x-ray 03/06/2019.

CLINICAL DATA: 49-year-old female with history of painful nodule
concerning for lymphadenopathy in the neck. Evaluate for thoracic
pathology.

EXAM:
CHEST - 2 VIEW

[chest pa]
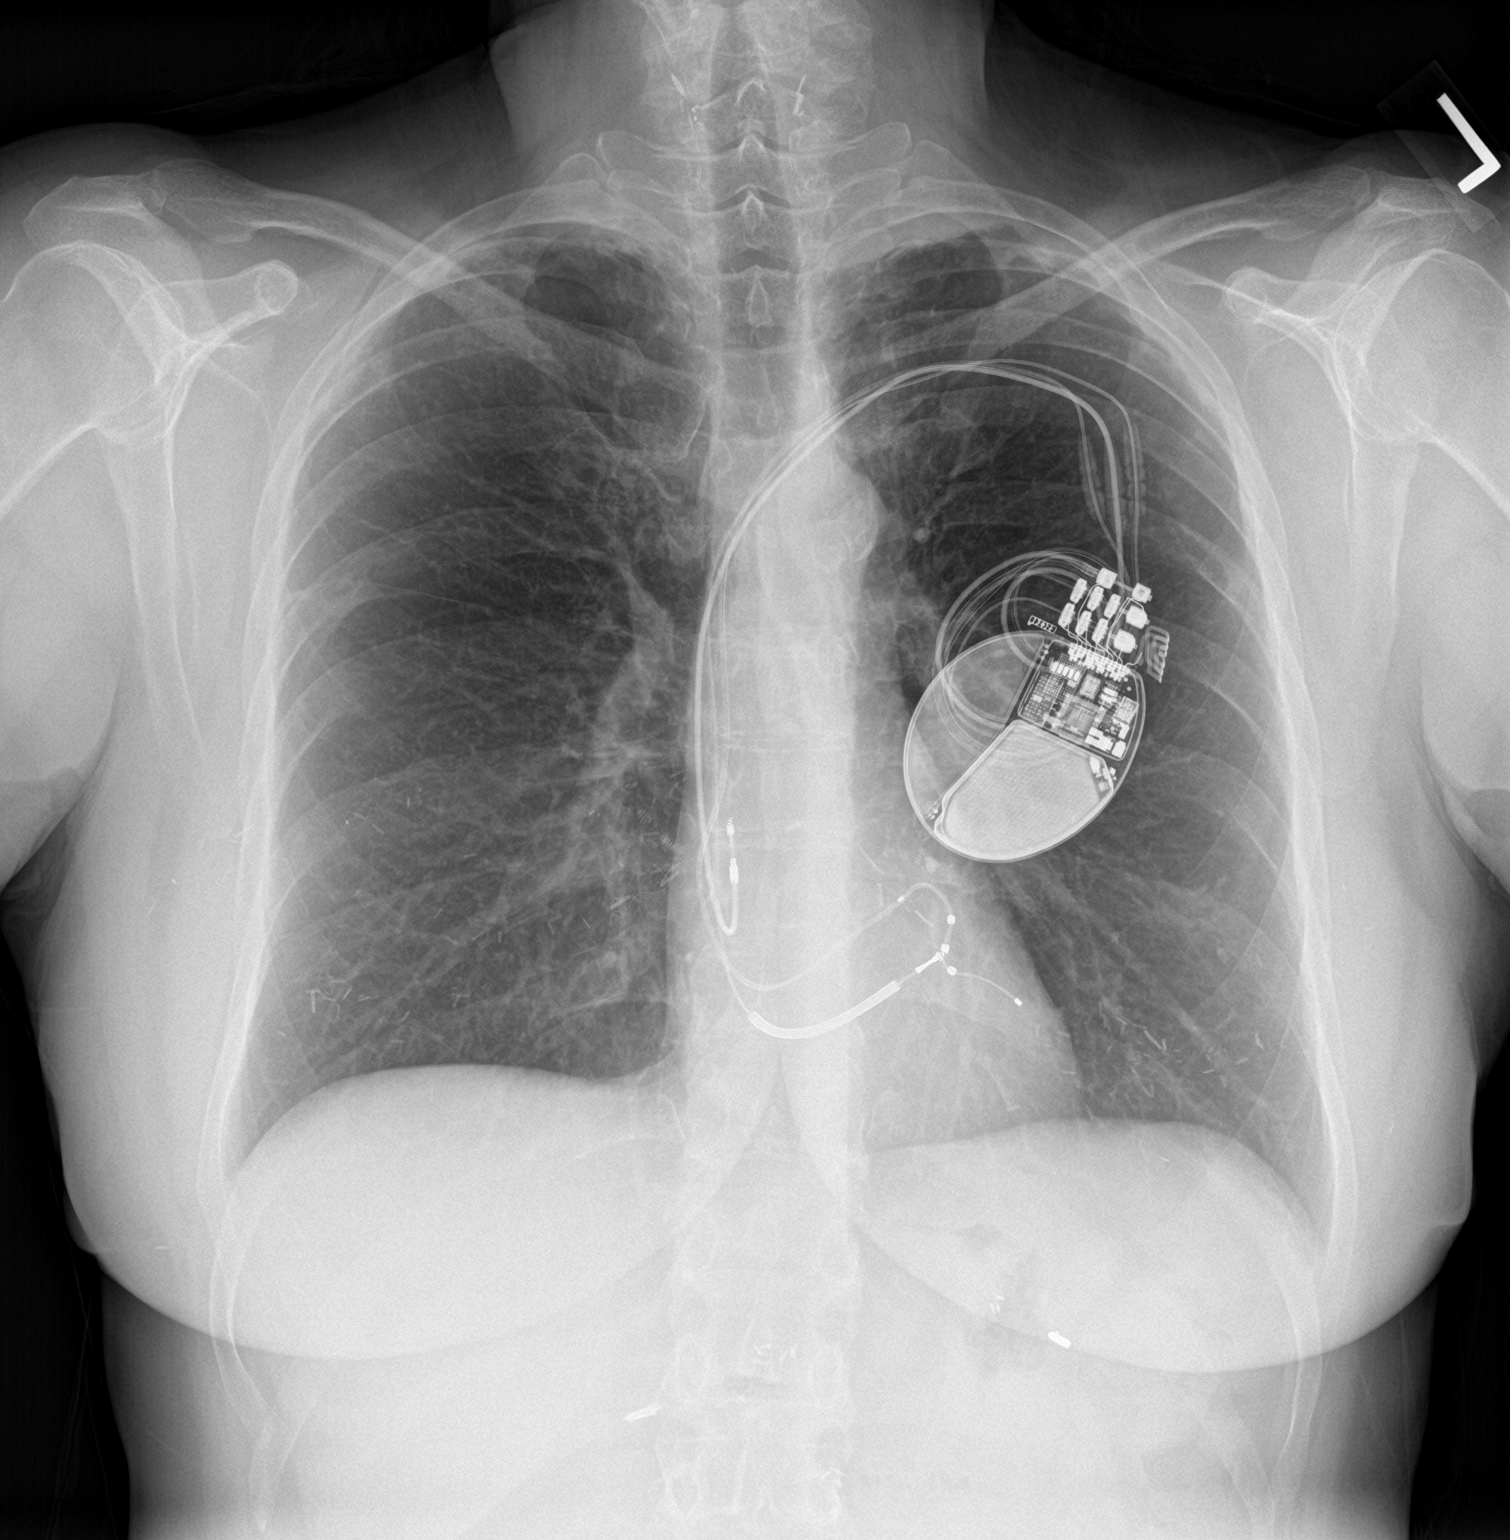

[chest lat]
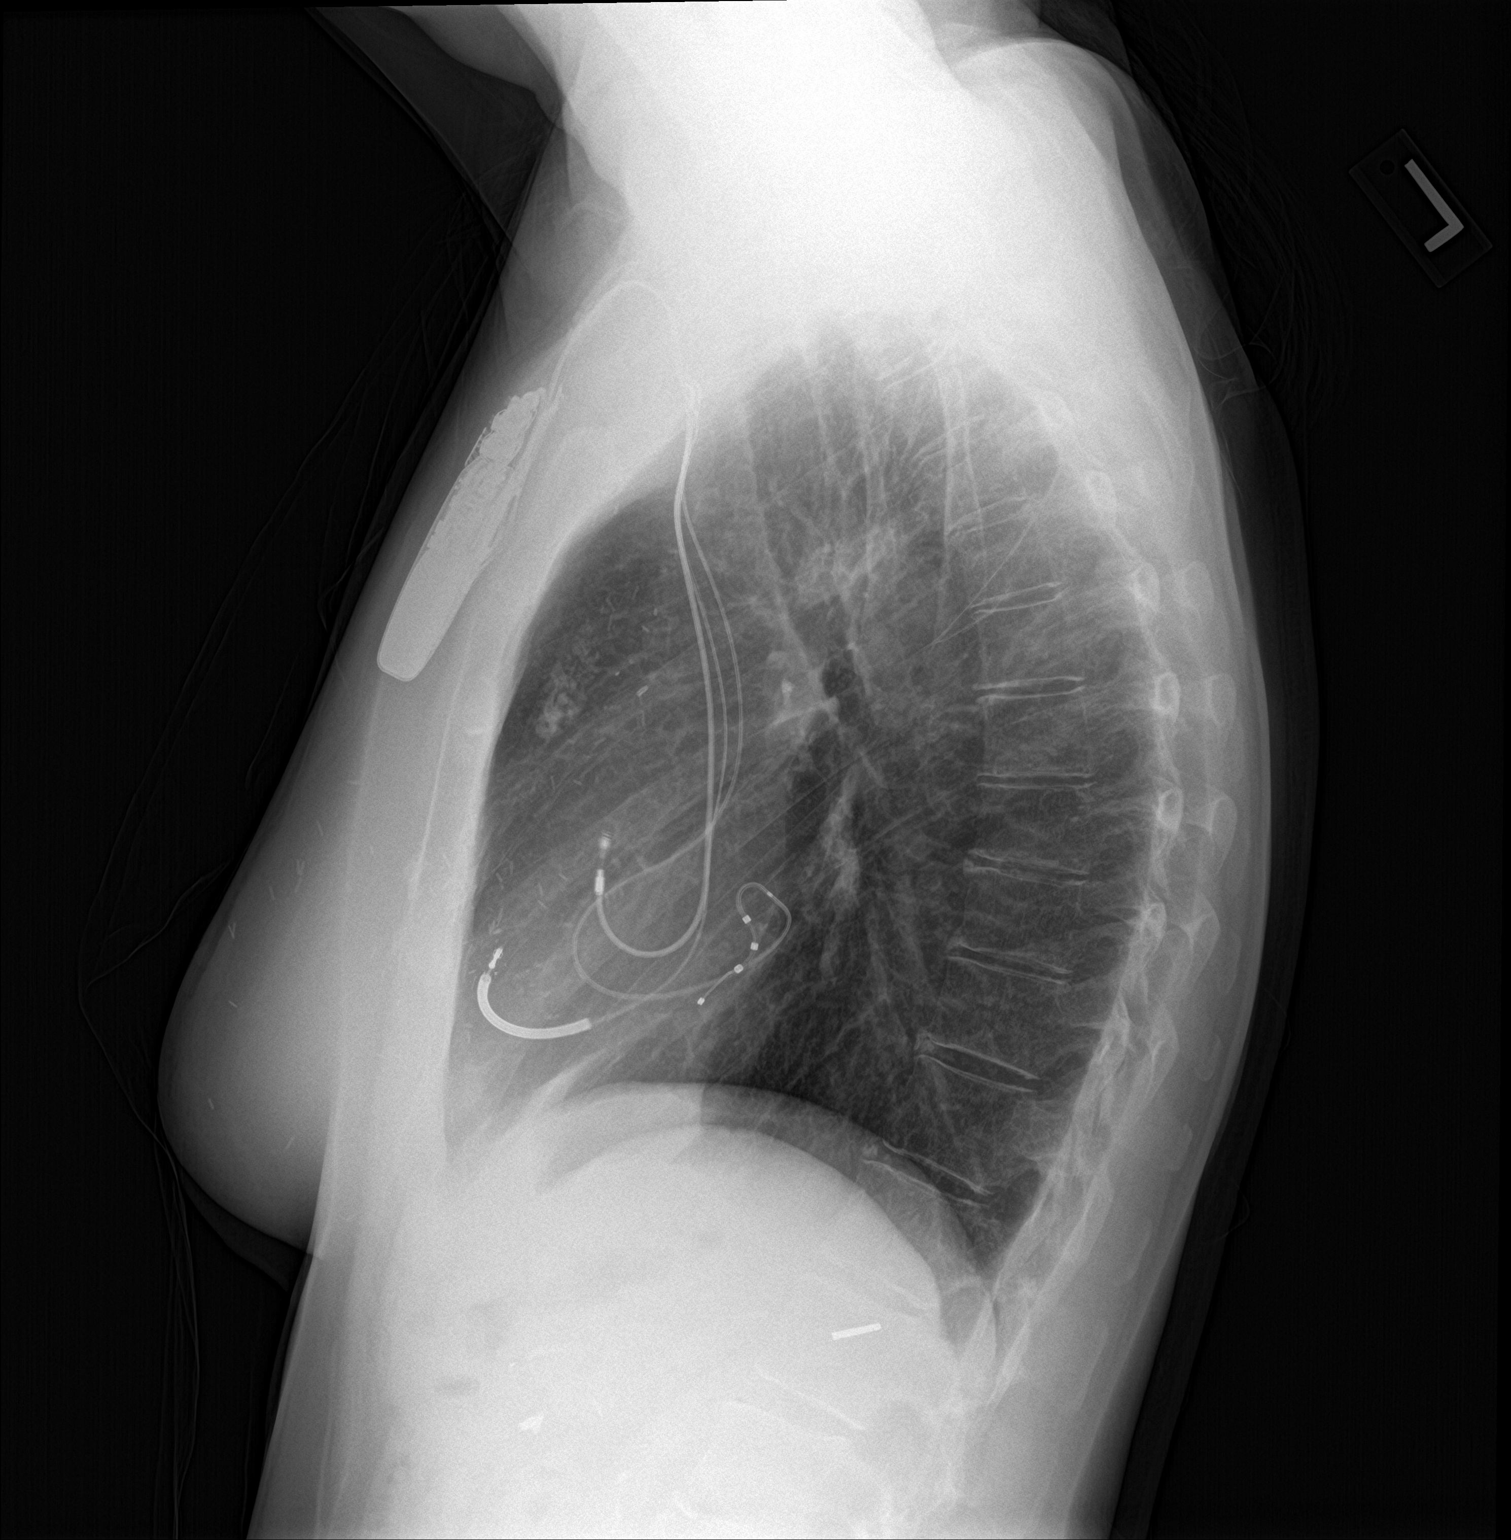

[2 of 2 positions shown; findings below may reference images not displayed]

FINDINGS: Lung volumes are normal. No consolidative airspace disease. No
pleural effusions. No pneumothorax. No pulmonary nodule or mass
noted. Bilateral apical nodular pleuroparenchymal thickening and
architectural distortion, similar to remote prior examination, most
compatible with areas of chronic post infectious or inflammatory
scarring. Pulmonary vasculature and the cardiomediastinal silhouette
are within normal limits. Atherosclerosis in the thoracic aorta.
Left-sided biventricular pacemaker/AICD with lead tips projecting
over the expected location of the right atrium, right ventricular
outflow tract and left ventricle via the coronary sinus and coronary
veins. Numerous surgical clips are seen projecting over the breasts
bilaterally. Surgical clips are also noted throughout the upper
abdomen. Surgical clips are noted throughout the cervical region,
likely from prior thyroidectomy.
IMPRESSION: 1. No radiographic evidence of acute cardiopulmonary disease.
2. Aortic atherosclerosis.
3. Postoperative changes, as above.

## 2023-01-14 IMAGING — US US SOFT TISSUE HEAD/NECK
1 series · 14 of 19 positions shown · non-contrast
Comparison: None.

CLINICAL DATA: Remote history of thyroid cancer, post
thyroidectomy. Patient now with palpable nodule within the midline
of the neck for the past 2 days.

EXAM:
ULTRASOUND OF HEAD/NECK SOFT TISSUES
TECHNIQUE: Ultrasound examination of the head and neck soft tissues was
performed in the area of clinical concern.

[Series 1: us soft tissue head/neck · 0.03mm/px · 19 acquisitions, 14 frames shown]
[im 1/19]
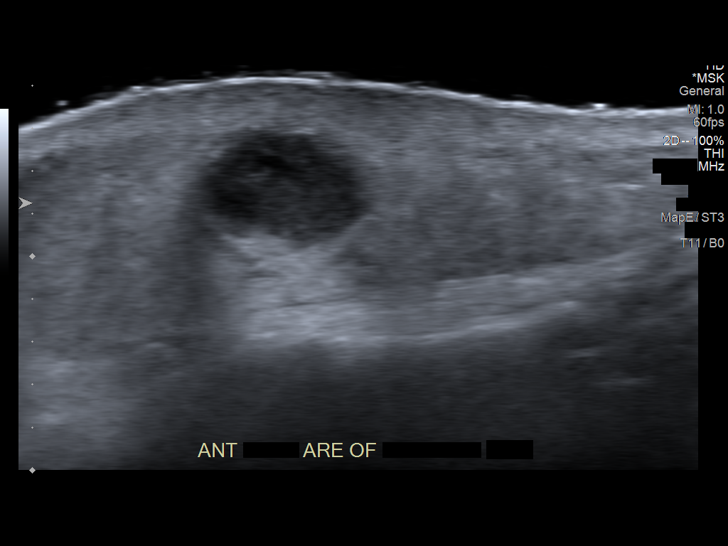
[im 3/19]
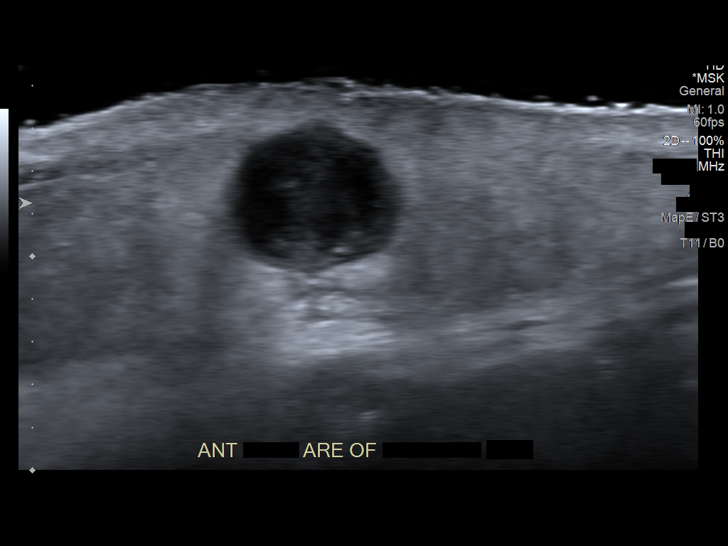
[im 4/19]
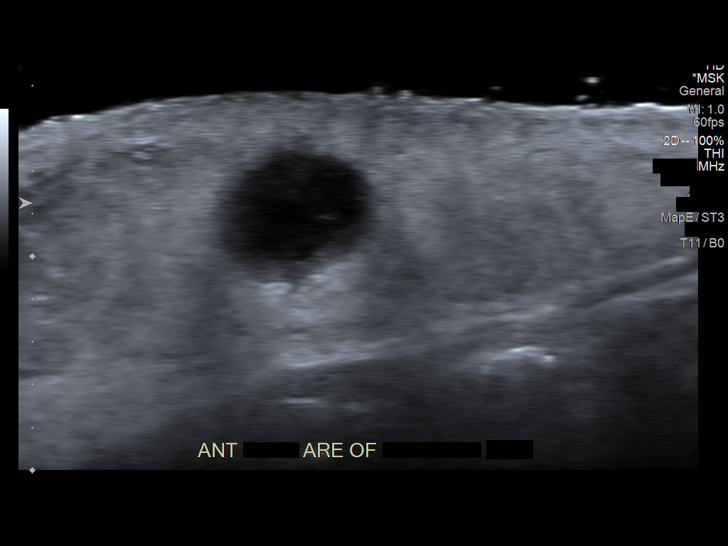
[im 5/19]
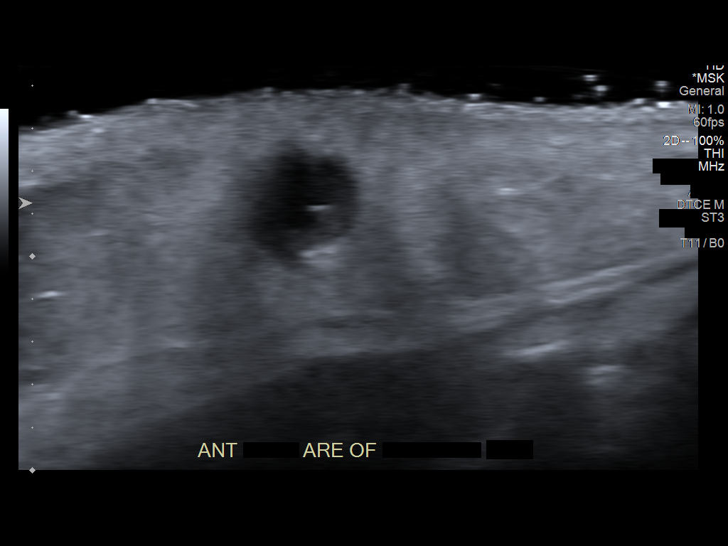
[im 7/19]
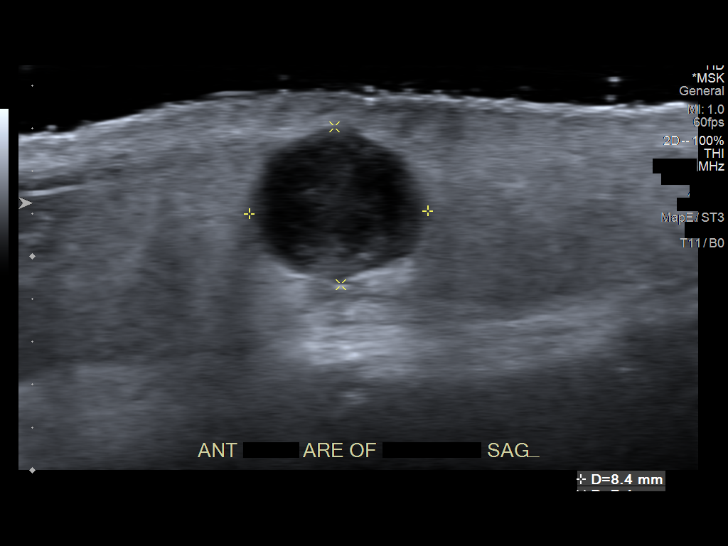
[im 8/19]
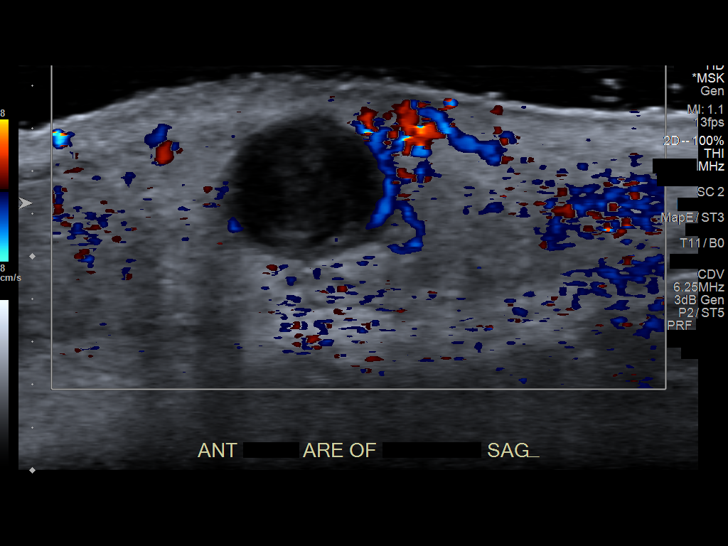
[im 9/19]
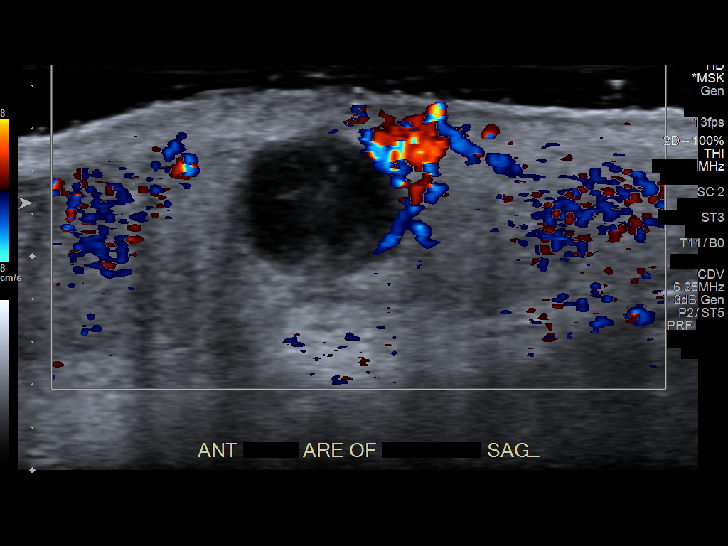
[im 11/19]
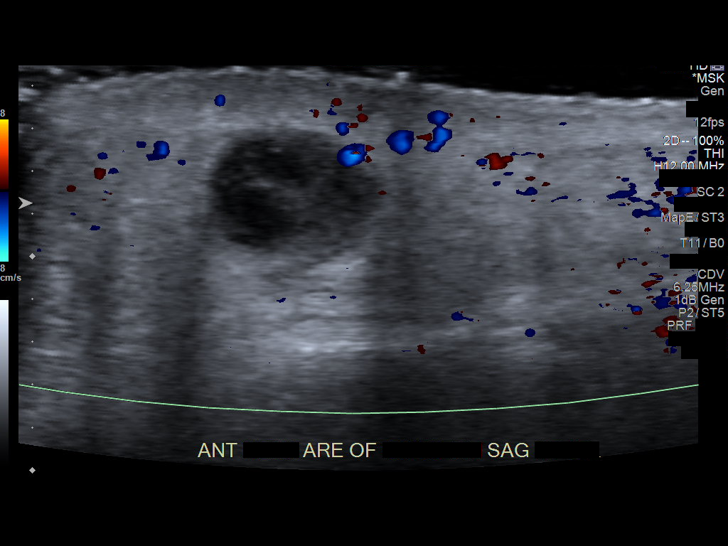
[im 12/19]
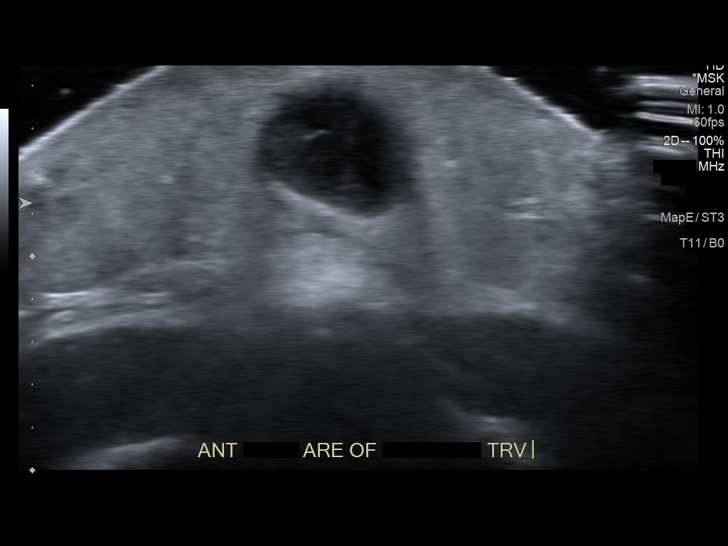
[im 13/19]
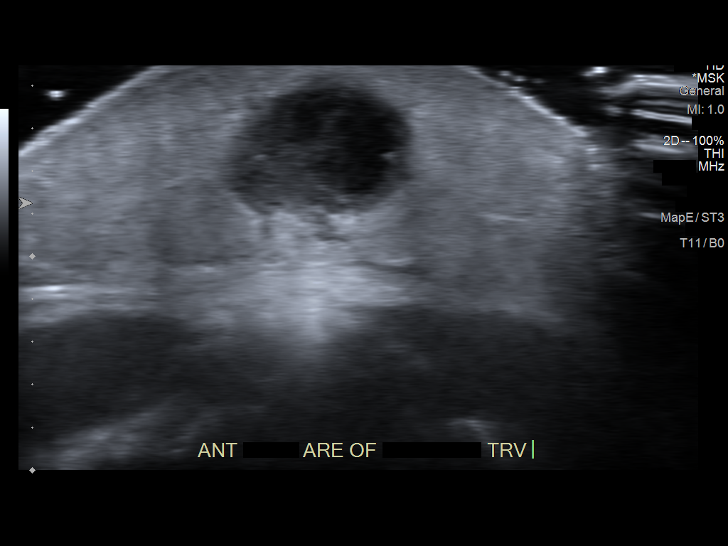
[im 15/19]
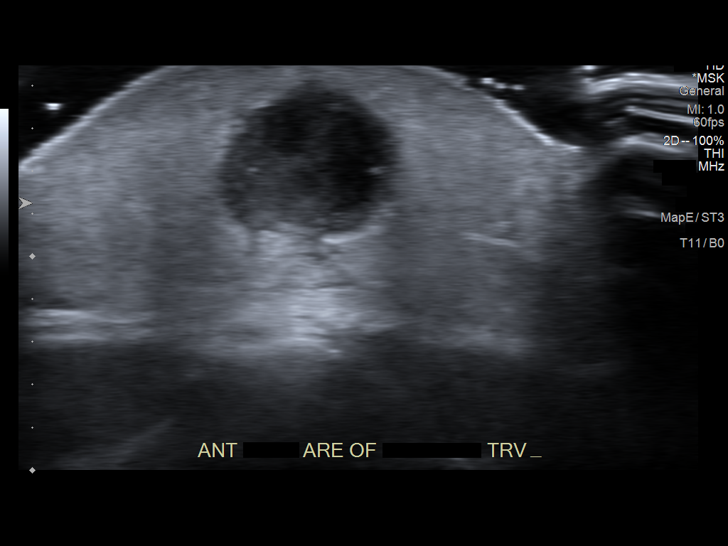
[im 16/19]
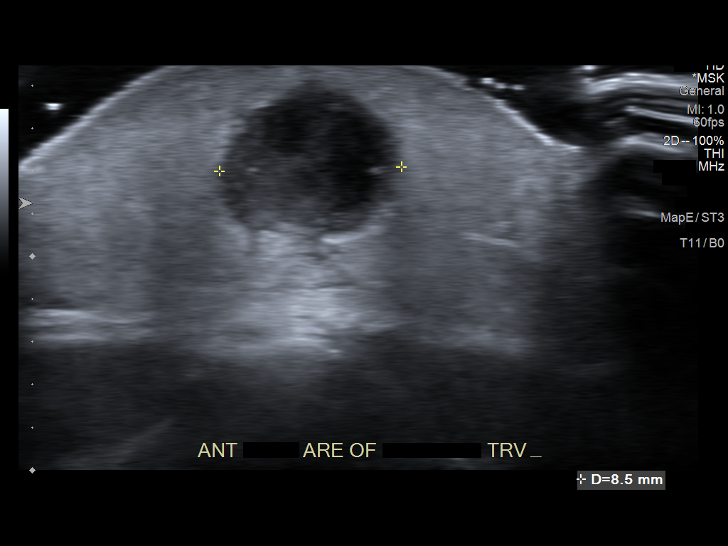
[im 17/19]
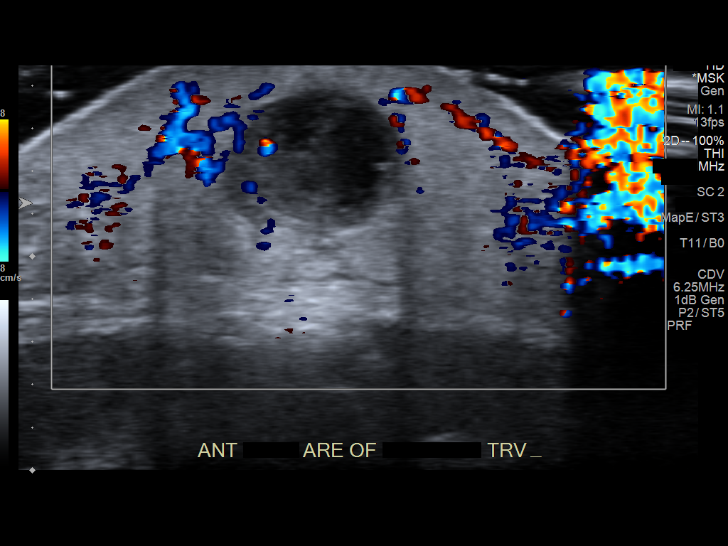
[im 19/19]
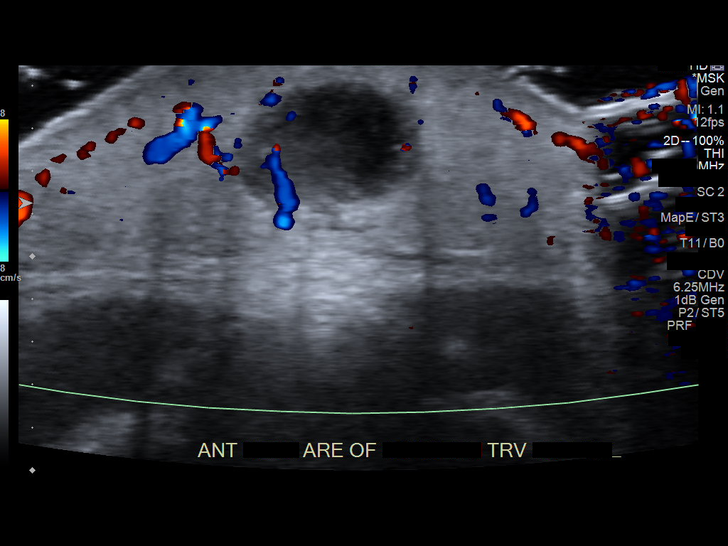

[14 of 19 positions shown; findings below may reference images not displayed]

FINDINGS: Patient's palpable area of concern within the midline of the neck
correlates with a well-defined approximately 0.9 x 0.8 x 0.7 cm
subcutaneous hypoechoic nodule. This nodule does not definitively
demonstrate communication with the overlying dermal surface.

There is no additional sonographic correlate for patient's palpable
area of concern.
IMPRESSION: Patient's palpable area of concern involving the midline of the neck
correlates with a well-defined approximately 0.9 cm subcutaneous
hypoechoic nodule, nonspecific though potentially representative of
a sebaceous cyst which may be potentially infected given provided
history of tenderness. Clinical correlation to resolution is
advised.

## 2023-01-27 ENCOUNTER — Other Ambulatory Visit (HOSPITAL_COMMUNITY): Payer: Self-pay | Admitting: Internal Medicine

## 2023-01-27 DIAGNOSIS — I5021 Acute systolic (congestive) heart failure: Secondary | ICD-10-CM

## 2023-02-16 ENCOUNTER — Ambulatory Visit: Payer: BC Managed Care – PPO | Admitting: Sports Medicine

## 2023-02-20 ENCOUNTER — Ambulatory Visit (INDEPENDENT_AMBULATORY_CARE_PROVIDER_SITE_OTHER): Payer: BC Managed Care – PPO | Admitting: Sports Medicine

## 2023-02-20 DIAGNOSIS — L7211 Pilar cyst: Secondary | ICD-10-CM | POA: Diagnosis not present

## 2023-02-20 MED ORDER — HYDROCODONE-ACETAMINOPHEN 5-325 MG PO TABS: 1.0000 | ORAL_TABLET | Freq: Three times a day (TID) | ORAL | 0 refills | Status: DC | PRN

## 2023-02-20 NOTE — Patient Instructions (Signed)
Incision Care, Adult An incision is a surgical cut that is made through your skin. Most incisions are closed after a surgical procedure. Your incision may be closed with stitches (sutures), staples, skin glue, or adhesive strips. You may need to return to your health care provider to have sutures or staples removed. This may occur several days or several weeks after your surgery. Until then, the incision needs to be cared for properly to prevent infection. Follow instructions from your health care provider about how to care for your incision. Supplies needed: Soap, water, and a clean hand towel. Wound cleanser. A clean bandage (dressing), if needed. Cream or ointment, if told by your health care provider. Clean gauze. How to care for your incision Cleaning the incision Ask your health care provider how to clean the incision. This may include: Wearing medical gloves. Using mild soap and water, or wound cleanser. Using a clean gauze to pat the incision dry after cleaning it. Dressing changes Wash your hands with soap and water for at least 20 seconds before and after you change the dressing. If soap and water are not available, use hand sanitizer. Do not use disinfectants or antiseptics, such as rubbing alcohol, to clean open wounds unless told by your health care provider. Change your dressing as told by your health care provider. Leave sutures, staples, skin glue, or adhesive strips in place. These skin closures may need to stay in place for 2 weeks or longer. If adhesive strip edges start to loosen and curl up, you may trim the loose edges. Do not remove adhesive strips completely unless your health care provider tells you to do that. Apply cream or ointment. Do this only as told by your health care provider. Cover the incision with a clean dressing. Ask your health care provider when you can start to leave the incision uncovered. Checking for infection Check your incision area every day for  signs of infection. Check for: More redness, swelling, or pain. More fluid or blood. New warmth, hardness, or a rash that develops along the incision. Pus or a bad smell.  Follow these instructions at home Medicines Take over-the-counter and prescription medicines only as told by your health care provider. If you were prescribed an antibiotic medicine, cream, or ointment, take or apply it as told by your health care provider. Do not stop using the antibiotic even if your condition improves. Eating and drinking Eat a diet that includes protein, vitamin A, vitamin C, and other nutrient-rich foods to help the wound heal. Foods rich in protein include meat, fish, eggs, dairy, beans, nuts, and protein supplement drinks. Foods rich in vitamin A include carrots and dark green, leafy vegetables. Foods rich in vitamin C include citrus fruits, tomatoes, broccoli, and peppers. Drink enough fluid to keep your urine pale yellow. General instructions  Do not take baths, swim, use a hot tub, or do anything that would put the incision underwater until your health care provider approves. Ask your health care provider if you may take showers. You may only be allowed to take sponge baths. Limit movement around your incision to promote healing. Avoid straining, lifting, or exercising for the first 2 weeks after your procedure, or for as long as told by your health care provider. Return to your normal activities as told by your health care provider. Ask your health care provider what activities are safe for you. Do not scratch, pick, or scrub the incision. Keep it covered as told by your health care provider.   Protect your incision from the sun when you are outside for the first 6 months, or for as long as told by your health care provider. Cover up the scar area or apply sunscreen that has an SPF of at least 30. Do not use any products that contain nicotine or tobacco, such as cigarettes, e-cigarettes, and  chewing tobacco. These can delay incision healing. If you need help quitting, ask your health care provider. Keep all follow-up visits. This is important. Contact a health care provider if: You have any of these signs of infection: More redness, swelling, or pain around your incision. More fluid or blood coming from your incision. New warmth or hardness around your incision. Pus or a bad smell coming from your incision. A rash that develops along the incision. You feel nauseous or you vomit. You are dizzy. Your sutures, staples, skin glue, or adhesive strips come undone. Your wound gets bigger. You have a fever. Get help right away if: Your incision bleeds through the dressing and the bleeding does not stop with gentle pressure. The edges of your incision open up and separate. These symptoms may represent a serious problem that is an emergency. Do not wait to see if the symptoms will go away. Get medical help right away. Call your local emergency services (911 in the [place] Do not drive yourself to the hospital. Summary Follow instructions from your health care provider about how to care for your incision. Wash your hands with soap and water for at least 20 seconds before and after you change the dressing. If soap and water are not available, use hand sanitizer. Check your incision area every day for signs of infection. Keep all follow-up visits. This is important. This information is not intended to replace advice given to you by your health care provider. Make sure you discuss any questions you have with your health care provider. Document Revised: 10/01/2020 Document Reviewed: 10/01/2020 Elsevier Patient Education  2024 Elsevier Inc.  

## 2023-02-20 NOTE — Progress Notes (Signed)
    Procedures performed today:    Procedure:  Excision of #3 scalp pilar cysts, all approximately 1 cm. Risks, benefits, and alternatives explained and consent obtained. Time out conducted. Surface prepped with alcohol. 1 mL lidocaine with epinephine infiltrated in a field block overlying each pilar cyst. Adequate anesthesia ensured. Area prepped and draped in a sterile fashion. Excision performed with: Using a #10 blade and made a linear incision, then I used blunt dissection to expose the pilar cyst, each pilar cyst was removed en bloc and the incision was closed with a single 3-0 simple interrupted Ethilon suture. Hemostasis achieved. Pt stable.  Independent interpretation of notes and tests performed by another provider:   None.  Brief History, Exam, Impression, and Recommendations:    Pilar cyst Today I made the medical decision to perform surgical excision of 3 pilar cysts, 1 on the vertex of the skull, 1 on the right side, 1 over the mastoid process. Single sutures placed in each, return to see me in a week for suture removal, hydrocodone for postoperative pain.    ____________________________________________ Ihor Austin. Benjamin Stain, M.D., ABFM., CAQSM., AME. Primary Care and Sports Medicine [place] [facility]  Adjunct Professor of Family Medicine  [employer]  Restaurant manager, fast food

## 2023-02-20 NOTE — Assessment & Plan Note (Addendum)
Today I made the medical decision to perform surgical excision of 3 pilar cysts, 1 on the vertex of the skull, 1 on the right side, 1 over the mastoid process. Single sutures placed in each, return to see me in a week for suture removal, hydrocodone for postoperative pain.

## 2023-02-27 ENCOUNTER — Ambulatory Visit: Payer: BC Managed Care – PPO | Admitting: Sports Medicine

## 2024-03-15 ENCOUNTER — Encounter: Payer: Self-pay | Admitting: Sports Medicine

## 2024-03-25 ENCOUNTER — Other Ambulatory Visit: Payer: Self-pay | Admitting: *Deleted

## 2024-03-25 DIAGNOSIS — C50511 Malignant neoplasm of lower-outer quadrant of right female breast: Secondary | ICD-10-CM

## 2024-03-28 ENCOUNTER — Inpatient Hospital Stay: Attending: Hematology and Oncology | Admitting: Hematology and Oncology

## 2024-03-28 ENCOUNTER — Inpatient Hospital Stay

## 2024-03-28 VITALS — BP 102/60 | HR 88 | Temp 97.6°F | Resp 16 | Ht 65.0 in | Wt 179.8 lb

## 2024-03-28 DIAGNOSIS — Z853 Personal history of malignant neoplasm of breast: Secondary | ICD-10-CM | POA: Diagnosis not present

## 2024-03-28 DIAGNOSIS — R531 Weakness: Secondary | ICD-10-CM | POA: Diagnosis not present

## 2024-03-28 DIAGNOSIS — Z170 Estrogen receptor positive status [ER+]: Secondary | ICD-10-CM | POA: Diagnosis not present

## 2024-03-28 DIAGNOSIS — C50511 Malignant neoplasm of lower-outer quadrant of right female breast: Secondary | ICD-10-CM | POA: Diagnosis not present

## 2024-03-28 DIAGNOSIS — E7800 Pure hypercholesterolemia, unspecified: Secondary | ICD-10-CM | POA: Diagnosis not present

## 2024-03-28 DIAGNOSIS — Z86711 Personal history of pulmonary embolism: Secondary | ICD-10-CM | POA: Insufficient documentation

## 2024-03-28 DIAGNOSIS — Z950 Presence of cardiac pacemaker: Secondary | ICD-10-CM | POA: Diagnosis not present

## 2024-03-28 DIAGNOSIS — Z08 Encounter for follow-up examination after completed treatment for malignant neoplasm: Secondary | ICD-10-CM | POA: Insufficient documentation

## 2024-03-28 DIAGNOSIS — I509 Heart failure, unspecified: Secondary | ICD-10-CM | POA: Insufficient documentation

## 2024-03-28 DIAGNOSIS — Z8571 Personal history of Hodgkin lymphoma: Secondary | ICD-10-CM | POA: Insufficient documentation

## 2024-03-28 DIAGNOSIS — R4189 Other symptoms and signs involving cognitive functions and awareness: Secondary | ICD-10-CM | POA: Diagnosis not present

## 2024-03-28 DIAGNOSIS — Z9013 Acquired absence of bilateral breasts and nipples: Secondary | ICD-10-CM | POA: Insufficient documentation

## 2024-03-28 LAB — CMP (CANCER CENTER ONLY)
ALT: 26 U/L (ref 0–44)
AST: 27 U/L (ref 15–41)
Albumin: 4.6 g/dL (ref 3.5–5.0)
Alkaline Phosphatase: 82 U/L (ref 38–126)
Anion gap: 7 (ref 5–15)
BUN: 18 mg/dL (ref 6–20)
CO2: 25 mmol/L (ref 22–32)
Calcium: 9.9 mg/dL (ref 8.9–10.3)
Chloride: 106 mmol/L (ref 98–111)
Creatinine: 1.05 mg/dL — ABNORMAL HIGH (ref 0.44–1.00)
GFR, Estimated: >60 mL/min (ref >60)
Glucose, Bld: 109 mg/dL — ABNORMAL HIGH (ref 70–99)
Potassium: 4.2 mmol/L (ref 3.5–5.1)
Sodium: 138 mmol/L (ref 135–145)
Total Bilirubin: 0.4 mg/dL (ref 0.0–1.2)
Total Protein: 7.9 g/dL (ref 6.5–8.1)

## 2024-03-28 LAB — CBC WITH DIFFERENTIAL (CANCER CENTER ONLY)
Abs Immature Granulocytes: 0.02 K/uL (ref 0.00–0.07)
Basophils Absolute: 0.1 K/uL (ref 0.0–0.1)
Basophils Relative: 1 %
Eosinophils Absolute: 0.1 K/uL (ref 0.0–0.5)
Eosinophils Relative: 2 %
HCT: 44.2 % (ref 36.0–46.0)
Hemoglobin: 15 g/dL (ref 12.0–15.0)
Immature Granulocytes: 0 %
Lymphocytes Relative: 25 %
Lymphs Abs: 2.1 K/uL (ref 0.7–4.0)
MCH: 30.7 pg (ref 26.0–34.0)
MCHC: 33.9 g/dL (ref 30.0–36.0)
MCV: 90.6 fL (ref 80.0–100.0)
Monocytes Absolute: 0.5 K/uL (ref 0.1–1.0)
Monocytes Relative: 6 %
Neutro Abs: 5.5 K/uL (ref 1.7–7.7)
Neutrophils Relative %: 66 %
Platelet Count: 302 K/uL (ref 150–400)
RBC: 4.88 MIL/uL (ref 3.87–5.11)
RDW: 14.3 % (ref 11.5–15.5)
WBC Count: 8.3 K/uL (ref 4.0–10.5)
nRBC: 0 % (ref 0.0–0.2)

## 2024-03-28 NOTE — Progress Notes (Signed)
 Patient Care Team: Curtis Debby PARAS, MD as PCP - General (Sports Medicine) Tod Ivanoff, NP as Nurse Practitioner (Obstetrics and Gynecology) Curtis Debby PARAS, MD as Consulting Physician (Sports Medicine) Odean Potts, MD as Consulting Physician (Hematology and Oncology) Curvin Deward MOULD, MD as Consulting Physician (General Surgery) Moses Powell Hummer, NP as Nurse Practitioner (Hematology and Oncology) Rutherford Gain, MD as Consulting Physician (Obstetrics and Gynecology)  DIAGNOSIS:  Encounter Diagnosis  Name Primary?   Malignant neoplasm of lower-outer quadrant of right breast of female, estrogen receptor positive (HCC) Yes    SUMMARY OF ONCOLOGIC HISTORY: Oncology History  Hodgkin's lymphoma (HCC)  06/21/1979 Initial Diagnosis   Stage III Hodgkin's disease with cervical, retroperitoneal lymph nodes, treated with ABVD-MOPP (alternating) followed by chest and abdominal radiation at [facility] with complete response   09/23/2001 Pathology Results   Melanoma stage I on the back treated with resection   09/27/2002 Pathology Results   Thyroid  cancer treated with thyroidectomy   Breast cancer of lower-outer quadrant of right female breast (HCC)  07/18/2015 Breast MRI   Rt breast mass 0.9 x 0.8 x 0.8 cm, with clumped NME 4 cm, 2 sites of Bx proven flat epithelial atypia   07/25/2015 Initial Diagnosis   Rt Breast Biopsy: IDC with calcs and DCIS and ALH, Er 100%, PR 100%, Ki 67 5%, Her 2 Neg Ratio 1.35   07/25/2015 Clinical Stage   Stage IA: T1b N0   07/25/2015 Oncotype testing   RS 6 (5% ROR)   07/26/2015 Procedure   Genetic testing revealed BRIP-1   08/28/2015 Surgery   Bilateral mastectomies with reconstruction in [place] [facility] (Dr.Delacroce): Residual high-grade DCIS 1.5 cm, 0/2 sentinel nodes   09/24/2015 - 11/29/2015 Anti-estrogen oral therapy   Tamoxifen  20 mg daily. Stopped May 2017 for pulmonary embolism    10/31/2015 Survivorship   Care plan completed and mailed to patient in lieu of in person visit at her request   11/20/2015 - 11/23/2015 Hospital Admission   Hospitalization for pulmonary embolism   05/15/2016 Surgery   Hysterectomy and BSO   07/23/2016 -  Anti-estrogen oral therapy   Letrozole  2.5 mg daily switched to anastrozole  03/23/2017 due to hot flashes and myalgias   03/17/2017 PET scan   No evidence of hypermetabolic recurrent or metastatic disease. Decrease in left hepatic lobe hypermetabolism, corresponding to a benign abnormality on prior MRI ; likely focal fat; Resolved left-sided pleural effusion     CHIEF COMPLIANT:   HISTORY OF PRESENT ILLNESS: Discussed the use of AI scribe software for clinical note transcription with the patient, who gave verbal consent to proceed.  History of Present Illness Stacy Chung is a 53 year old female with a history of breast cancer who presents for follow-up after completing hormone therapy.  She has completed seven years of hormone therapy for breast cancer and is now discontinuing the medication. She is interested in a new blood test for early detection of breast cancer recurrence using circulating tumor DNA (CT DNA).  She has a family history of cancer and has previously undergone radiation therapy, which affects her eligibility for certain tests like Cologuard. She is interested in new diagnostic tests for colon cancer, although a colonoscopy remains the gold standard for her situation.     ALLERGIES:  is allergic to latex, other, tamoxifen , macrodantin, pertussis vaccines, and tape.  MEDICATIONS:  Current Outpatient Medications  Medication Sig Dispense Refill   AMBULATORY NON FORMULARY MEDICATION Myofascial release therapy and  massage therapy 2-3 times a week as needed 1 each 0   anastrozole  (ARIMIDEX ) 1 MG tablet TAKE 1 TABLET(1 MG) BY MOUTH DAILY 90 tablet 3   apixaban  (ELIQUIS ) 5 MG TABS tablet Take 1 tablet (5 mg total) by  mouth 2 (two) times daily. 180 tablet 3   buPROPion  (WELLBUTRIN  XL) 300 MG 24 hr tablet Take 1 tablet (300 mg total) by mouth every morning. 90 tablet 3   CALCIUM  PO Take 1,200 mg by mouth daily.      clonazePAM  (KLONOPIN ) 0.5 MG tablet TAKE 1 TABLET(0.5 MG) BY MOUTH TWICE DAILY FOR ANXIETY 180 tablet 0   esomeprazole  (NEXIUM ) 40 MG capsule TAKE 1 CAPSULE DAILY AT 12 NOON 90 capsule 3   furosemide  (LASIX ) 40 MG tablet Take 40 mg by mouth as needed.     ondansetron  (ZOFRAN -ODT) 8 MG disintegrating tablet Take 1 tablet (8 mg total) by mouth every 8 (eight) hours as needed for nausea. 60 tablet 3   OZEMPIC, 0.25 OR 0.5 MG/DOSE, 2 MG/3ML SOPN INJECT 0.25 MG SUBCUTANEOUSLY EVERY WEEK FOR 4 WEEKS THEN INCREASE TO 0.5MG  EVERY WEEK. STOP MOUNJARO     potassium chloride  SA (KLOR-CON  M) 20 MEQ tablet Take 20 mEq by mouth as needed.     Prasterone  (INTRAROSA ) 6.5 MG INST Place 1 application vaginally daily. 90 each 3   rosuvastatin  (CRESTOR ) 20 MG tablet Take 1 tablet (20 mg total) by mouth at bedtime. 90 tablet 3   spironolactone  (ALDACTONE ) 25 MG tablet Take 0.5 tablets (12.5 mg total) by mouth daily. NEEDS FOLLOW UP APPOINTMENT FOR MORE REFILLS 45 tablet 0   SPRAVATO, 84 MG DOSE, 28 MG/DEVICE SOPK Place 84 mg into both nostrils.     Ubrogepant  (UBRELVY ) 100 MG TABS Take 1 tablet by mouth as needed. 30 tablet 11   Cholecalciferol (VITAMIN D3) 10000 UNITS capsule Take 10,000 Units by mouth daily. Only 5 days a week     SYNTHROID  112 MCG tablet Take 112 mcg by mouth daily.     No current facility-administered medications for this visit.    PHYSICAL EXAMINATION: ECOG PERFORMANCE STATUS: 1 - Symptomatic but completely ambulatory  Vitals:   03/28/24 1421  BP: 102/60  Pulse: 88  Resp: 16  Temp: 97.6 F (36.4 C)  SpO2: 100%   Filed Weights   03/28/24 1421  Weight: 179 lb 12.8 oz (81.6 kg)    Physical Exam   (exam performed in the presence of a chaperone)  LABORATORY DATA:  I have reviewed  the data as listed    Latest Ref Rng & Units 10/08/2020   12:11 PM 09/18/2020    9:44 AM 11/23/2019    4:08 PM  CMP  Glucose 65 - 99 mg/dL 88  899  885   BUN 7 - 25 mg/dL 17  18  16    Creatinine 0.50 - 1.10 mg/dL 9.04  9.02  9.07   Sodium 135 - 146 mmol/L 140  137  136   Potassium 3.5 - 5.3 mmol/L 4.7  4.7  4.4   Chloride 98 - 110 mmol/L 104  101  102   CO2 20 - 32 mmol/L 24  18  21    Calcium  8.6 - 10.2 mg/dL 9.6  9.8  9.6   Total Protein 6.1 - 8.1 g/dL 7.6  7.1  7.2   Total Bilirubin 0.2 - 1.2 mg/dL 0.5  0.2  <9.7   Alkaline Phos 44 - 121 IU/L  158  122   AST 10 -  35 U/L 68  71  28   ALT 6 - 29 U/L 87  91  35     Lab Results  Component Value Date   WBC 8.3 03/28/2024   HGB 15.0 03/28/2024   HCT 44.2 03/28/2024   MCV 90.6 03/28/2024   PLT 302 03/28/2024   NEUTROABS 5.5 03/28/2024    ASSESSMENT & PLAN:  Breast cancer of lower-outer quadrant of right female breast (HCC) Rt Breast mass 07/18/15: MRI breast: 0.9 x 0.8 x 0.8 cm, with clumped NME 4 cm, 2 sites of Bx proven flat epithelial atypia, T1bN0 (stage 1A) Rt Breast Biopsy 07/25/15: IDC with calcs and DCIS and ALH, Er 100%, PR 100%, Ki 67 5%, Her 2 Neg Ratio 1.35 Bilateral mastectomies 08/28/2015: In [place], 1.5 cm residual high-grade DCIS Tis N0 stage 0 Final pathologic staging: T1a N0 stage IA DX recurrence score 6, 5% risk of recurrence with tamoxifen  Liver biopsy: 12/20/15: Benign   BRIP-1: This increases her risk of breast and ovarian cancer. (Patient to have oophorectomy in [place] and of June)   MRI Abd 03/20/17: Stable benign focal nodular hyperplasia in the right hepatic lobe. No evidence of metastatic disease or other acute findings. MRI Breast 03/17/17: No MR evidence of breast malignancy PET 9/4/18No evidence of hypermetabolic recurrent or metastatic disease. Decrease in left hepatic lobe hypermetabolism, corresponding to a benign abnormality on prior MRI ; likely focal fat; Resolved left-sided pleural effusion    Hospitalization 12/12/2017 for pneumothorax, third episode status post chest tube ___________________________________________________________________________   Current treatment: Tamoxifen  20 mg daily stopped due to pulmonary embolism started 09/24/2015 stopped 11/29/2015.  Started letrozole  06/26/2016, then changed to Anastrozole  03/23/2017 Having completed 7 years of therapy we discussed and stopped antiestrogen therapy at this time.   Anastrozole  toxicities: 1.  Hypercholesterolemia 2.  Joint aches and stiffness   Cognitive dysfunction: Very severe.  Brain MRI has been ordered and is pending.  I will request Dr. Buckley of the neuro oncologist to see her for an assessment.   Patient was hospitalized in June 2019 for recurrent pneumothorax which required chest tube placement. CHF with pacemaker implantation December 2020   Breast cancer surveillance: 1.  Breast exam 03/28/2024: Benign 2.  No role of mammograms since she had bilateral mastectomies.   She is currently on disability.  Given her severely low cardiac reserves and her profound cognitive impairment and had generalized fatigue and weakness, patient is unfortunately unable to go back to work.   Recommend guardant reveal for MRD testing Telephone follow-up in 1 year   No orders of the defined types were placed in this encounter.  The patient has a good understanding of the overall plan. she agrees with it. she will call with any problems that may develop before the next visit here. Total time spent: 30 mins including face to face time and time spent for planning, charting and co-ordination of care   Stacy MARLA Chad, MD 03/28/24

## 2024-03-28 NOTE — Assessment & Plan Note (Signed)
 Rt Breast mass 07/18/15: MRI breast: 0.9 x 0.8 x 0.8 cm, with clumped NME 4 cm, 2 sites of Bx proven flat epithelial atypia, T1bN0 (stage 1A) Rt Breast Biopsy 07/25/15: IDC with calcs and DCIS and ALH, Er 100%, PR 100%, Ki 67 5%, Her 2 Neg Ratio 1.35 Bilateral mastectomies 08/28/2015: In [place], 1.5 cm residual high-grade DCIS Tis N0 stage 0 Final pathologic staging: T1a N0 stage IA DX recurrence score 6, 5% risk of recurrence with tamoxifen  Liver biopsy: 12/20/15: Benign   BRIP-1: This increases her risk of breast and ovarian cancer. (Patient to have oophorectomy in [place] and of June)   MRI Abd 03/20/17: Stable benign focal nodular hyperplasia in the right hepatic lobe. No evidence of metastatic disease or other acute findings. MRI Breast 03/17/17: No MR evidence of breast malignancy PET 9/4/18No evidence of hypermetabolic recurrent or metastatic disease. Decrease in left hepatic lobe hypermetabolism, corresponding to a benign abnormality on prior MRI ; likely focal fat; Resolved left-sided pleural effusion   Hospitalization 12/12/2017 for pneumothorax, third episode status post chest tube ___________________________________________________________________________   Current treatment: Tamoxifen  20 mg daily stopped due to pulmonary embolism started 09/24/2015 stopped 11/29/2015.  Started letrozole  06/26/2016, then changed to Anastrozole  03/23/2017 Plan to treat her for 7 years.   Anastrozole  toxicities: 1.  Hypercholesterolemia 2.  Joint aches and stiffness   Cognitive dysfunction: Very severe.  Brain MRI has been ordered and is pending.  I will request Dr. Buckley of the neuro oncologist to see her for an assessment.   Patient was hospitalized in June 2019 for recurrent pneumothorax which required chest tube placement. CHF with pacemaker implantation December 2020   Breast cancer surveillance: 1.  Breast exam 03/28/2024: Benign 2.  No role of mammograms since she had bilateral  mastectomies.   There is no way patient is able to work in her current situation.  Given her severely low cardiac reserves and her profound cognitive impairment and had generalized fatigue and weakness, patient is unfortunately unable to go back to work.   Return to clinic in 1 year for follow-up

## 2024-04-01 ENCOUNTER — Encounter: Payer: Self-pay | Admitting: *Deleted

## 2024-04-01 NOTE — Progress Notes (Signed)
 Guardant Reveal orders placed through the portal.

## 2024-04-14 ENCOUNTER — Telehealth: Payer: Self-pay | Admitting: *Deleted

## 2024-04-14 NOTE — Telephone Encounter (Signed)
 Per MD request RN placed call to pt with recent Guardant Reveal results being negative.  No answer, LVM for pt to return call to the office.

## 2024-04-18 ENCOUNTER — Encounter: Payer: Self-pay | Admitting: Hematology and Oncology

## 2024-06-23 LAB — COLOGUARD: COLOGUARD: POSITIVE — AB

## 2024-07-05 ENCOUNTER — Encounter (HOSPITAL_BASED_OUTPATIENT_CLINIC_OR_DEPARTMENT_OTHER): Payer: Self-pay | Admitting: Emergency Medicine

## 2024-07-05 ENCOUNTER — Other Ambulatory Visit (HOSPITAL_BASED_OUTPATIENT_CLINIC_OR_DEPARTMENT_OTHER): Payer: Self-pay

## 2024-07-05 ENCOUNTER — Ambulatory Visit (HOSPITAL_BASED_OUTPATIENT_CLINIC_OR_DEPARTMENT_OTHER)
Admission: EM | Admit: 2024-07-05 | Discharge: 2024-07-05 | Disposition: A | Discharge status: Home / Self Care | Attending: Family Medicine | Admitting: Family Medicine

## 2024-07-05 DIAGNOSIS — R319 Hematuria, unspecified: Secondary | ICD-10-CM | POA: Diagnosis not present

## 2024-07-05 DIAGNOSIS — R300 Dysuria: Secondary | ICD-10-CM | POA: Diagnosis not present

## 2024-07-05 DIAGNOSIS — N390 Urinary tract infection, site not specified: Secondary | ICD-10-CM | POA: Insufficient documentation

## 2024-07-05 DIAGNOSIS — J069 Acute upper respiratory infection, unspecified: Secondary | ICD-10-CM | POA: Diagnosis not present

## 2024-07-05 LAB — POCT URINE DIPSTICK
Glucose, UA: NEGATIVE mg/dL
Nitrite, UA: NEGATIVE
Protein Ur, POC: >=300 mg/dL — AB
Spec Grav, UA: >=1.03 — AB
Urobilinogen, UA: 0.2 U/dL
pH, UA: 5.5

## 2024-07-05 MED ORDER — PROMETHAZINE-DM 6.25-15 MG/5ML PO SYRP
5.0000 mL | ORAL_SOLUTION | Freq: Four times a day (QID) | ORAL | 0 refills | Status: AC | PRN
  Filled 2024-07-05: qty 118, 6d supply, fill #0

## 2024-07-05 MED ORDER — CEPHALEXIN 500 MG PO CAPS
1000.0000 mg | ORAL_CAPSULE | Freq: Two times a day (BID) | ORAL | 0 refills | Status: AC
  Filled 2024-07-05: qty 28, 7d supply, fill #0

## 2024-07-05 NOTE — ED Provider Notes (Signed)
 " Stacy Chung    CSN: [id number] Arrival date & time: 07/05/24  1244      History   Chief Complaint Chief Complaint  Patient presents with   Cough   Urinary Frequency    HPI Stacy Chung is a 53 y.o. female.   Patient reports fever, head congestion, fatigue on 06/28/2024.  She was in [place]  at the time.  She also developed a cough, headache on 06/29/2024.  She continues with cough and fatigue.  She denies fever, nausea, vomiting, constipation, diarrhea.  Today, 07/05/2024, she has developed frequency, urgency and burning with urination.   Cough Associated symptoms: no chest pain, no chills, no ear pain, no fever, no rash, no shortness of breath and no sore throat   Urinary Frequency Pertinent negatives include no chest pain, no abdominal pain and no shortness of breath.    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   Cancer (HCC) 1986   Hodgkins lymphoma   Carcinoma of thyroid  (HCC)    CHF (congestive heart failure) (HCC)    Family history of breast cancer    Family history of colon cancer    Family history of prostate cancer    GERD (gastroesophageal reflux disease)    History of chicken pox    Hodgkin's disease    Hx: UTI (urinary tract infection)    Increased frequency of headaches    Left breast mass    Migraines    PONV (postoperative nausea and vomiting)    Primary spontaneous pneumothorax 1990   left   Pulmonary embolism (HCC) 11/30/2015   Recurrent spontaneous pneumothorax 1991   left   Skin cancer 2003   Melanoma   Thyroid  disease     Patient Active Problem List   Diagnosis Date Noted   Pain of left middle finger 01/09/2022   Transaminitis 10/09/2020   Mass in neck 09/10/2020   Mild neurocognitive disorder due to multiple etiologies 12/30/2019   Hx of cholecystectomy 09/26/2019   Pilar cyst 08/08/2019   Acute systolic heart failure with  apical thrombus 03/18/2019   Pleural scarring 11/24/2018   Daytime sleepiness with cognitive decline 06/25/2018   Neck pain 03/25/2018   Pneumothorax on left 12/13/2017   Hypothyroidism 12/13/2017   GERD (gastroesophageal reflux disease) 12/13/2017   Anxiety and depression 12/13/2017   Annual physical exam 11/27/2017   Right wrist pain 11/12/2017   Rotator cuff syndrome of both shoulders 07/28/2017   Pulmonary embolism (HCC) 01/29/2016   Liver lesion 01/03/2016   Family history of breast cancer    Family history of prostate cancer    Family history of colon cancer    Skin cancer    Genetic testing 11/20/2015   Ganglion cyst of flexor tendon sheath of finger of left hand 11/19/2015   Breast cancer of lower-outer quadrant of right female breast (HCC) 09/22/2015   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 09/15/2014   Peroneus longus tear 11/24/2013   Toe swelling 09/06/2013   Melanoma (HCC) 06/20/2011   Hodgkin's lymphoma (HCC) 06/20/2011   Migraine 06/20/2011   Malignant neoplasm of thyroid  gland (HCC) 06/20/2011   Hyperglycemia 06/20/2011   Hirsutism 06/20/2011    Past Surgical History:  Procedure Laterality Date   ABDOMINAL HYSTERECTOMY     CHEST TUBE INSERTION  1990   collasped lung, spontaneous   CHOLECYSTECTOMY  FOOT TENDON SURGERY Left    LAPAROSCOPIC SPLENECTOMY  1987   Hodgkins Disease   MASTECTOMY     BIL   PACEMAKER INSERTION     THORACOTOMY Left 1991   recurrent spontaneous pneumothorax - Dr Jama   THYROIDECTOMY  2004   nodules, carcinoma   URETEROPLASTY  1977   malformed ureter    OB History   No obstetric history on file.      Home Medications    Prior to Admission medications  Medication Sig Start Date End Date Taking? Authorizing Provider  buPROPion  (WELLBUTRIN  XL) 300 MG 24 hr tablet Take 1 tablet (300 mg total) by mouth every morning. 01/09/22  Yes Curtis Debby PARAS, MD  CALCIUM  PO Take 1,200 mg by mouth daily.    Yes [provider]  cephALEXin  (KEFLEX ) 500 MG capsule Take 2 capsules (1,000 mg total) by mouth 2 (two) times daily for 7 days. 07/05/24 07/12/24 Yes Ival Domino, FNP  Cholecalciferol (VITAMIN D3) 10000 UNITS capsule Take 10,000 Units by mouth daily. Only 5 days a week   Yes [provider]  clonazePAM  (KLONOPIN ) 0.5 MG tablet TAKE 1 TABLET(0.5 MG) BY MOUTH TWICE DAILY FOR ANXIETY 01/09/22  Yes Curtis Debby PARAS, MD  esomeprazole  (NEXIUM ) 40 MG capsule TAKE 1 CAPSULE DAILY AT 12 NOON 01/09/22  Yes Curtis Debby PARAS, MD  furosemide  (LASIX ) 40 MG tablet Take 40 mg by mouth as needed.   Yes [provider]  OZEMPIC, 0.25 OR 0.5 MG/DOSE, 2 MG/3ML SOPN INJECT 0.25 MG SUBCUTANEOUSLY EVERY WEEK FOR 4 WEEKS THEN INCREASE TO 0.5MG  EVERY WEEK. STOP MOUNJARO 02/19/24  Yes [provider]  Prasterone  (INTRAROSA ) 6.5 MG INST Place 1 application vaginally daily. 04/20/20  Yes Curtis Debby PARAS, MD  promethazine -dextromethorphan (PROMETHAZINE -DM) 6.25-15 MG/5ML syrup Take 5 mLs by mouth 4 (four) times daily as needed for cough. Do not use and drive - May make drowsy. 07/05/24  Yes Ival Domino, FNP  rosuvastatin  (CRESTOR ) 20 MG tablet Take 1 tablet (20 mg total) by mouth at bedtime. 01/09/22  Yes Curtis Debby PARAS, MD  spironolactone  (ALDACTONE ) 25 MG tablet Take 0.5 tablets (12.5 mg total) by mouth daily. NEEDS FOLLOW UP APPOINTMENT FOR MORE REFILLS 01/27/23  Yes Bensimhon, Toribio SAUNDERS, MD  SPRAVATO, 84 MG DOSE, 28 MG/DEVICE SOPK Place 84 mg into both nostrils.   Yes [provider]  SYNTHROID  112 MCG tablet Take 112 mcg by mouth daily. 01/01/22  Yes [provider]  Ubrogepant  (UBRELVY ) 100 MG TABS Take 1 tablet by mouth as needed. 01/09/22  Yes Curtis Debby PARAS, MD  AMBULATORY NON FORMULARY MEDICATION Myofascial release therapy and massage therapy 2-3 times a week as needed 09/22/17   Curtis Debby PARAS, MD  ondansetron  (ZOFRAN -ODT) 8 MG disintegrating  tablet Take 1 tablet (8 mg total) by mouth every 8 (eight) hours as needed for nausea. 04/20/20   Curtis Debby PARAS, MD  potassium chloride  SA (KLOR-CON  M) 20 MEQ tablet Take 20 mEq by mouth as needed.    [provider]    Family History Family History  Problem Relation Age of Onset   Prostate cancer Maternal Uncle 43   Colon cancer Maternal Grandmother        dx in her 60s   Prostate cancer Maternal Grandfather        dx in his 45s   Cancer Other        FH of Breast Cancer, Ovarian/Uterine Cancer   Heart disease Maternal Uncle  63s   Breast cancer Other        MGFs sister - reportedly BRCA+   Breast cancer Other        MGMs sister   Breast cancer Other        MGFs mother    Social History Social History[1]   Allergies   Latex, Other, Tamoxifen , Macrodantin, Pertussis vaccines, and Tape   Review of Systems Review of Systems  Constitutional:  Positive for fatigue. Negative for chills and fever.  HENT:  Negative for ear pain and sore throat.   Eyes:  Negative for pain and visual disturbance.  Respiratory:  Positive for cough. Negative for shortness of breath.   Cardiovascular:  Negative for chest pain and palpitations.  Gastrointestinal:  Negative for abdominal pain, constipation, diarrhea, nausea and vomiting.  Genitourinary:  Positive for dysuria, frequency and urgency. Negative for hematuria.  Musculoskeletal:  Negative for arthralgias and back pain.  Skin:  Negative for color change and rash.  Neurological:  Negative for seizures and syncope.  All other systems reviewed and are negative.    Physical Exam Triage Vital Signs ED Triage Vitals  Encounter Vitals Group     BP 07/05/24 1525 95/64     Girls Systolic BP Percentile --      Girls Diastolic BP Percentile --      Boys Systolic BP Percentile --      Boys Diastolic BP Percentile --      Pulse Rate 07/05/24 1525 75     Resp 07/05/24 1525 18     Temp 07/05/24 1525 98.2 F (36.8 C)      Temp Source 07/05/24 1525 Oral     SpO2 07/05/24 1525 98 %     Weight --      Height --      Head Circumference --      Peak Flow --      Pain Score 07/05/24 1522 0     Pain Loc --      Pain Education --      Exclude from Growth Chart --    No data found.  Updated Vital Signs BP 95/64 (BP Location: Left Arm)   Pulse 75   Temp 98.2 F (36.8 C) (Oral)   Resp 18   LMP 02/29/2016   SpO2 98%   Visual Acuity Right Eye Distance:   Left Eye Distance:   Bilateral Distance:    Right Eye Near:   Left Eye Near:    Bilateral Near:     Physical Exam Vitals and nursing note reviewed.  Constitutional:      General: She is not in acute distress.    Appearance: She is well-developed. She is not ill-appearing or toxic-appearing.  HENT:     Head: Normocephalic and atraumatic.     Right Ear: Hearing, tympanic membrane, ear canal and external ear normal.     Left Ear: Hearing, tympanic membrane, ear canal and external ear normal.     Nose: No congestion or rhinorrhea.     Right Sinus: No maxillary sinus tenderness or frontal sinus tenderness.     Left Sinus: No maxillary sinus tenderness or frontal sinus tenderness.     Mouth/Throat:     Lips: Pink.     Mouth: Mucous membranes are moist.     Pharynx: Uvula midline. No oropharyngeal exudate or posterior oropharyngeal erythema.     Tonsils: No tonsillar exudate.  Eyes:     Conjunctiva/sclera: Conjunctivae normal.  Pupils: Pupils are equal, round, and reactive to light.  Cardiovascular:     Rate and Rhythm: Normal rate and regular rhythm.     Heart sounds: S1 normal and S2 normal. No murmur heard. Pulmonary:     Effort: Pulmonary effort is normal. No respiratory distress.     Breath sounds: Normal breath sounds. No decreased breath sounds, wheezing, rhonchi or rales.  Abdominal:     General: Bowel sounds are normal.     Palpations: Abdomen is soft.     Tenderness: There is no abdominal tenderness.  Musculoskeletal:         General: No swelling.     Cervical back: Neck supple.  Lymphadenopathy:     Head:     Right side of head: No submental, submandibular, tonsillar, preauricular or posterior auricular adenopathy.     Left side of head: No submental, submandibular, tonsillar, preauricular or posterior auricular adenopathy.     Cervical: No cervical adenopathy.     Right cervical: No superficial cervical adenopathy.    Left cervical: No superficial cervical adenopathy.  Skin:    General: Skin is warm and dry.     Capillary Refill: Capillary refill takes less than 2 seconds.     Findings: No rash.  Neurological:     Mental Status: She is alert and oriented to person, place, and time.  Psychiatric:        Mood and Affect: Mood normal.      UC Treatments / Results  Labs (all labs ordered are listed, but only abnormal results are displayed) Labs Reviewed  POCT URINE DIPSTICK - Abnormal; Notable for the following components:      Result Value   Clarity, UA hazy (*)    Bilirubin, UA small (*)    Ketones, POC UA small (15) (*)    Spec Grav, UA >=1.030 (*)    Blood, UA large (*)    Protein Ur, POC >=300 (*)    Leukocytes, UA Small (1+) (*)    All other components within normal limits  URINE CULTURE    EKG   Radiology No results found.  Procedures Procedures (including critical Chung time)  Medications Ordered in UC Medications - No data to display  Initial Impression / Assessment and Plan / UC Course  I have reviewed the triage vital signs and the nursing notes.  Pertinent labs & imaging results that were available during my Chung of the patient were reviewed by me and considered in my medical decision making (see chart for details).  Plan of Chung (see discharge instructions for additional patient precautions and education): UTI with dysuria: Urinalysis is abnormal.  Cephalexin  500 mg 2 pills twice daily for 7 days.  Get plenty of fluids and rest.  Urine culture sent and will adjust the plan  of Chung, if needed once the culture results.  Viral upper respiratory infection: Promethazine  DM, 5 mL every 6 hours if needed for cough.  Her lungs are clear and her exam is almost normal from an upper respiratory perspective.  I believe this is resolving.  Follow-up if symptoms do not improve, worsen or new symptoms occur.  I reviewed the plan of Chung with the patient and/or the patient's guardian.  The patient and/or guardian had time to ask questions and acknowledged that the questions were answered.  Final Clinical Impressions(s) / UC Diagnoses   Final diagnoses:  Dysuria  Viral URI  Urinary tract infection with hematuria, site unspecified     Discharge  Instructions      UTI with dysuria: Urinalysis is abnormal.  Cephalexin  500 mg 2 pills twice daily for 7 days.  Get plenty of fluids and rest.  Urine culture sent and will adjust the plan of Chung, if needed once the culture results.  Viral upper respiratory infection: Promethazine  DM, 5 mL every 6 hours if needed for cough.  Her lungs are clear and her exam is almost normal from an upper respiratory perspective.  I believe this is resolving.  Follow-up if symptoms do not improve, worsen or new symptoms occur.     ED Prescriptions     Medication Sig Dispense Auth. Provider   promethazine -dextromethorphan (PROMETHAZINE -DM) 6.25-15 MG/5ML syrup Take 5 mLs by mouth 4 (four) times daily as needed for cough. Do not use and drive - May make drowsy. 118 mL Ival Domino, FNP   cephALEXin  (KEFLEX ) 500 MG capsule Take 2 capsules (1,000 mg total) by mouth 2 (two) times daily for 7 days. 28 capsule Ival Domino, FNP      PDMP not reviewed this encounter.    [1]  Social History Tobacco Use   Smoking status: Never   Smokeless tobacco: Never  Vaping Use   Vaping status: Never Used  Substance Use Topics   Alcohol use: Not Currently   Drug use: No     Ival Domino, FNP 07/05/24 1546  "

## 2024-07-05 NOTE — Discharge Instructions (Addendum)
 UTI with dysuria: Urinalysis is abnormal.  Cephalexin  500 mg 2 pills twice daily for 7 days.  Get plenty of fluids and rest.  Urine culture sent and will adjust the plan of care, if needed once the culture results.  Viral upper respiratory infection: Promethazine  DM, 5 mL every 6 hours if needed for cough.  Her lungs are clear and her exam is almost normal from an upper respiratory perspective.  I believe this is resolving.  Follow-up if symptoms do not improve, worsen or new symptoms occur.

## 2024-07-05 NOTE — ED Triage Notes (Signed)
 Pt reports she went out of town to [place]  on Tuesday she had a fever, head congestion, fatigue. on Wed she had a headache, cough still has fatigue and coughing.   Pt reports UTI symptoms started today. Pt c/o frequency, urgency, and burning.

## 2024-07-06 LAB — URINE CULTURE: Culture: <10000 — AB

## 2024-07-08 ENCOUNTER — Ambulatory Visit (HOSPITAL_COMMUNITY): Payer: Self-pay

## 2024-07-28 ENCOUNTER — Encounter (INDEPENDENT_AMBULATORY_CARE_PROVIDER_SITE_OTHER): Payer: Self-pay | Admitting: Otolaryngology

## 2024-07-28 ENCOUNTER — Ambulatory Visit (INDEPENDENT_AMBULATORY_CARE_PROVIDER_SITE_OTHER): Admitting: Otolaryngology

## 2024-07-28 VITALS — BP 94/61 | HR 86 | Temp 98.2°F | Ht 65.0 in | Wt 164.0 lb

## 2024-07-28 DIAGNOSIS — R221 Localized swelling, mass and lump, neck: Secondary | ICD-10-CM

## 2024-07-28 MED ORDER — OXYCODONE HCL 5 MG PO TABS: 5.0000 mg | ORAL_TABLET | ORAL | 0 refills | Status: AC | PRN

## 2024-07-28 NOTE — Progress Notes (Signed)
 Dear Dr. Dorena, Here is my assessment for our mutual patient, Stacy Chung. Thank you for allowing me the opportunity to care for your patient. Please do not hesitate to contact me should you have any other questions. Sincerely, Dr. Eldora Blanch  Otolaryngology Clinic Note Referring provider: Dr. Dorena HPI:  Stacy Chung is a 54 y.o. female kindly referred by Dr. Dorena for evaluation of neck mass  Initial visit (07/2024): Stacy Chung  Discussed the use of AI scribe software for clinical note transcription with the patient, who gave verbal consent to proceed.  History of Present Illness Stacy Chung is a 54 year old female with a history of thyroidectomy for thyroid  cancer who presents for evaluation of a persistent neck nodule.  She has a midline neck nodule that was about 1 cm on prior ultrasound and has slightly increased in size. It is non-tender. Present since at least 2022. She can occasionally express malodorous white material with manual compression. It has been infected once without recurrence. She denies other neck masses.  Patient otherwise denies: - dysphagia, odynophagia, unintentional weight loss - changes in voice, shortness of breath, hemoptysis - ear pain, other neck masses  She had a thyroidectomy many years ago for thyroid  cancer. Reported NED. She denies current thyroid -related symptoms.  She is not on anticoagulation.   ENT Surgery: Thyroidectomy Personal or FHx of bleeding dz or anesthesia difficulty: no  AP/AC: no  Tobacco: no  PMHx: HF with Pacemaker, Breast Cancer, T2DM, Migraine, Thyroid  Cancer, PE, Melanoma in situ (lower back), Lymphoma  Independent Review of Additional Tests or Records:  Dr. Terri (06/12/2021): neck cyst, ear pain, midline neck subcutaneous cysts which have been infected; Dx: Sebaceous cyst; Rx: amox, monitor cyst Neck US  reviewed (08/2020): agree with read Patient's palpable area of concern involving the midline of the neck  correlates with a well-defined approximately 0.9 cm subcutaneous  hypoechoic nodule, nonspecific though potentially representative of  a sebaceous cyst which may be potentially infected given provided  history of tenderness. Clinical correlation to resolution is  advised. Labs CBC and CMP 03/28/2024: WBC 8.3, Bun/Cr 18/1.05 TSH 05/24/2024: 0.538  PMH/Meds/All/SocHx/FamHx/ROS:   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   Cancer (HCC) 1986   Hodgkins lymphoma   Carcinoma of thyroid  (HCC)    CHF (congestive heart failure) (HCC)    Family history of breast cancer    Family history of colon cancer    Family history of prostate cancer    GERD (gastroesophageal reflux disease)    History of chicken pox    Hodgkin's disease    Hx: UTI (urinary tract infection)    Increased frequency of headaches    Left breast mass    Migraines    PONV (postoperative nausea and vomiting)    Primary spontaneous pneumothorax 1990   left   Pulmonary embolism (HCC) 11/30/2015   Recurrent spontaneous pneumothorax 1991   left   Skin cancer 2003   Melanoma   Thyroid  disease      Past Surgical History:  Procedure Laterality Date   ABDOMINAL HYSTERECTOMY     CHEST TUBE INSERTION  1990   collasped lung, spontaneous   CHOLECYSTECTOMY     FOOT TENDON SURGERY Left    LAPAROSCOPIC SPLENECTOMY  1987   Hodgkins Disease   MASTECTOMY     BIL   PACEMAKER INSERTION  THORACOTOMY Left 1991   recurrent spontaneous pneumothorax - Dr Jama   THYROIDECTOMY  2004   nodules, carcinoma   URETEROPLASTY  1977   malformed ureter    Family History  Problem Relation Age of Onset   Prostate cancer Maternal Uncle 55   Colon cancer Maternal Grandmother        dx in her 41s   Prostate cancer Maternal Grandfather        dx in his 35s   Cancer Other        FH of Breast Cancer, Ovarian/Uterine Cancer   Heart disease  Maternal Uncle        55s   Breast cancer Other        MGFs sister - reportedly BRCA+   Breast cancer Other        MGMs sister   Breast cancer Other        MGFs mother     Social Connections: Not on file     Current Medications[1]   Physical Exam:   BP 94/61   Pulse 86   Temp 98.2 F (36.8 C)   Ht 5' 5 (1.651 m)   Wt 164 lb (74.4 kg)   LMP 02/29/2016   SpO2 95%   BMI 27.29 kg/m   Salient findings:  CN II-XII intact Bilateral EAC clear and TM intact with well pneumatized middle ear spaces Anterior rhinoscopy: Septum intact; bilateral inferior turbinates without significant hypertrophy No lesions of oral cavity/oropharynx No obviously palpable neck masses/lymphadenopathy -- midline neck mass 2x2 cm appears dermal, mobile; likely dermoid cyst; thyroidectomy scar well healed No respiratory distress or stridor  Seprately Identifiable Procedures:  Prior to initiating any procedures, risks/benefits/alternatives were explained to the patient and verbal consent obtained. None  Impression & Plans:  Stacy Chung is a 54 y.o. female with:  1. Neck mass    Growing slowly, wishes for removal; will schedule her for this at her convenience Risks discussed including recurrence, poor wound healing, dehiscence, scarring, and need for further procedures  She is ready to proceed; pre-med with oxycodone   See below regarding exact medications prescribed this encounter including dosages and route: Meds ordered this encounter  Medications   oxyCODONE  (OXY IR/ROXICODONE ) 5 MG immediate release tablet    Sig: Take 1 tablet (5 mg total) by mouth every 4 (four) hours as needed for severe pain (pain score 7-10).    Dispense:  2 tablet    Refill:  0      Thank you for allowing me the opportunity to care for your patient. Please do not hesitate to contact me should you have any other questions.  Sincerely, Eldora Blanch, MD Otolaryngologist (ENT), [facility] Phone:  [phone] Fax: [phone]  07/28/2024, 10:56 AM   MDM:  Level 4 - [phone] Complexity/Problems addressed: low Data complexity: mod - independent US  interpretation; review of note, labs - Morbidity: mod  - Prescription Drug prescribed or managed: y      [1]  Current Outpatient Medications:    oxyCODONE  (OXY IR/ROXICODONE ) 5 MG immediate release tablet, Take 1 tablet (5 mg total) by mouth every 4 (four) hours as needed for severe pain (pain score 7-10)., Disp: 2 tablet, Rfl: 0   AMBULATORY NON FORMULARY MEDICATION, Myofascial release therapy and massage therapy 2-3 times a week as needed, Disp: 1 each, Rfl: 0   buPROPion  (WELLBUTRIN  XL) 300 MG 24 hr tablet, Take 1 tablet (300 mg total) by mouth every morning., Disp: 90 tablet, Rfl: 3  CALCIUM  PO, Take 1,200 mg by mouth daily. , Disp: , Rfl:    Cholecalciferol (VITAMIN D3) 10000 UNITS capsule, Take 10,000 Units by mouth daily. Only 5 days a week, Disp: , Rfl:    clonazePAM  (KLONOPIN ) 0.5 MG tablet, TAKE 1 TABLET(0.5 MG) BY MOUTH TWICE DAILY FOR ANXIETY, Disp: 180 tablet, Rfl: 0   esomeprazole  (NEXIUM ) 40 MG capsule, TAKE 1 CAPSULE DAILY AT 12 NOON, Disp: 90 capsule, Rfl: 3   furosemide  (LASIX ) 40 MG tablet, Take 40 mg by mouth as needed., Disp: , Rfl:    ondansetron  (ZOFRAN -ODT) 8 MG disintegrating tablet, Take 1 tablet (8 mg total) by mouth every 8 (eight) hours as needed for nausea., Disp: 60 tablet, Rfl: 3   OZEMPIC, 0.25 OR 0.5 MG/DOSE, 2 MG/3ML SOPN, INJECT 0.25 MG SUBCUTANEOUSLY EVERY WEEK FOR 4 WEEKS THEN INCREASE TO 0.5MG  EVERY WEEK. STOP MOUNJARO, Disp: , Rfl:    potassium chloride  SA (KLOR-CON  M) 20 MEQ tablet, Take 20 mEq by mouth as needed., Disp: , Rfl:    Prasterone  (INTRAROSA ) 6.5 MG INST, Place 1 application vaginally daily., Disp: 90 each, Rfl: 3   promethazine -dextromethorphan (PROMETHAZINE -DM) 6.25-15 MG/5ML syrup, Take 5 mLs by mouth 4 (four) times daily as needed for cough. Do not use and drive - May make drowsy.,  Disp: 118 mL, Rfl: 0   rosuvastatin  (CRESTOR ) 20 MG tablet, Take 1 tablet (20 mg total) by mouth at bedtime., Disp: 90 tablet, Rfl: 3   spironolactone  (ALDACTONE ) 25 MG tablet, Take 0.5 tablets (12.5 mg total) by mouth daily. NEEDS FOLLOW UP APPOINTMENT FOR MORE REFILLS, Disp: 45 tablet, Rfl: 0   SPRAVATO, 84 MG DOSE, 28 MG/DEVICE SOPK, Place 84 mg into both nostrils., Disp: , Rfl:    SYNTHROID  112 MCG tablet, Take 112 mcg by mouth daily., Disp: , Rfl:    Ubrogepant  (UBRELVY ) 100 MG TABS, Take 1 tablet by mouth as needed., Disp: 30 tablet, Rfl: 11

## 2024-08-30 ENCOUNTER — Ambulatory Visit (INDEPENDENT_AMBULATORY_CARE_PROVIDER_SITE_OTHER): Admitting: Otolaryngology

## 2025-03-28 ENCOUNTER — Telehealth: Admitting: Hematology and Oncology
